# Patient Record
Sex: Female | Born: 1935 | Hispanic: No | Smoking: Never smoker
Health system: Southern US, Community
[De-identification: ages and names within clinical notes are randomized; demographics above are authoritative.]

## PROBLEM LIST (undated history)

## (undated) DIAGNOSIS — I2699 Other pulmonary embolism without acute cor pulmonale: Secondary | ICD-10-CM

## (undated) DIAGNOSIS — M1990 Unspecified osteoarthritis, unspecified site: Secondary | ICD-10-CM

## (undated) DIAGNOSIS — G2581 Restless legs syndrome: Secondary | ICD-10-CM

## (undated) DIAGNOSIS — I10 Essential (primary) hypertension: Secondary | ICD-10-CM

## (undated) HISTORY — PX: KNEE SURGERY: SHX244

## (undated) HISTORY — PX: EYE SURGERY: SHX253

## (undated) HISTORY — PX: CHOLECYSTECTOMY: SHX55

## (undated) HISTORY — PX: WRIST SURGERY: SHX841

## (undated) HISTORY — PX: ABDOMINAL HYSTERECTOMY: SHX81

---

## 2001-08-22 ENCOUNTER — Encounter: Admission: RE | Admit: 2001-08-22 | Discharge: 2001-08-22 | Payer: Self-pay | Admitting: Neurosurgery

## 2003-12-30 ENCOUNTER — Ambulatory Visit (HOSPITAL_COMMUNITY): Admission: RE | Admit: 2003-12-30 | Discharge: 2003-12-30 | Payer: Self-pay | Admitting: Orthopaedic Surgery

## 2005-05-18 ENCOUNTER — Ambulatory Visit: Payer: Self-pay | Admitting: Cardiology

## 2011-01-22 ENCOUNTER — Emergency Department (HOSPITAL_COMMUNITY): Payer: No Typology Code available for payment source

## 2011-01-22 ENCOUNTER — Emergency Department (HOSPITAL_COMMUNITY)
Admission: EM | Admit: 2011-01-22 | Discharge: 2011-01-22 | Disposition: A | Discharge status: Home / Self Care | Payer: No Typology Code available for payment source | Attending: Emergency Medicine | Admitting: Emergency Medicine

## 2011-01-22 ENCOUNTER — Encounter: Payer: Self-pay | Admitting: Emergency Medicine

## 2011-01-22 DIAGNOSIS — R51 Headache: Secondary | ICD-10-CM | POA: Insufficient documentation

## 2011-01-22 DIAGNOSIS — M129 Arthropathy, unspecified: Secondary | ICD-10-CM | POA: Insufficient documentation

## 2011-01-22 DIAGNOSIS — S0990XA Unspecified injury of head, initial encounter: Secondary | ICD-10-CM

## 2011-01-22 DIAGNOSIS — I10 Essential (primary) hypertension: Secondary | ICD-10-CM | POA: Insufficient documentation

## 2011-01-22 HISTORY — DX: Unspecified osteoarthritis, unspecified site: M19.90

## 2011-01-22 HISTORY — DX: Essential (primary) hypertension: I10

## 2011-01-22 MED ORDER — CYCLOBENZAPRINE HCL 10 MG PO TABS: 10.0000 mg | ORAL_TABLET | Freq: Two times a day (BID) | ORAL | Status: AC | PRN
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
## 2013-06-04 ENCOUNTER — Ambulatory Visit (HOSPITAL_COMMUNITY)
Admission: RE | Admit: 2013-06-04 | Discharge: 2013-06-04 | Disposition: A | Discharge status: Home / Self Care | Payer: Medicare Other | Source: Ambulatory Visit | Attending: Orthopaedic Surgery | Admitting: Orthopaedic Surgery

## 2013-06-04 ENCOUNTER — Other Ambulatory Visit (HOSPITAL_COMMUNITY): Payer: Self-pay | Admitting: Orthopaedic Surgery

## 2013-06-04 DIAGNOSIS — M79604 Pain in right leg: Secondary | ICD-10-CM

## 2013-06-04 DIAGNOSIS — M79609 Pain in unspecified limb: Secondary | ICD-10-CM | POA: Insufficient documentation

## 2015-07-04 DIAGNOSIS — M9902 Segmental and somatic dysfunction of thoracic region: Secondary | ICD-10-CM | POA: Diagnosis not present

## 2015-07-04 DIAGNOSIS — M47812 Spondylosis without myelopathy or radiculopathy, cervical region: Secondary | ICD-10-CM | POA: Diagnosis not present

## 2015-07-04 DIAGNOSIS — M9901 Segmental and somatic dysfunction of cervical region: Secondary | ICD-10-CM | POA: Diagnosis not present

## 2015-07-04 DIAGNOSIS — M546 Pain in thoracic spine: Secondary | ICD-10-CM | POA: Diagnosis not present

## 2015-07-04 DIAGNOSIS — M9903 Segmental and somatic dysfunction of lumbar region: Secondary | ICD-10-CM | POA: Diagnosis not present

## 2015-07-04 DIAGNOSIS — M47816 Spondylosis without myelopathy or radiculopathy, lumbar region: Secondary | ICD-10-CM | POA: Diagnosis not present

## 2015-07-04 DIAGNOSIS — M545 Low back pain: Secondary | ICD-10-CM | POA: Diagnosis not present

## 2015-07-07 DIAGNOSIS — M47816 Spondylosis without myelopathy or radiculopathy, lumbar region: Secondary | ICD-10-CM | POA: Diagnosis not present

## 2015-07-07 DIAGNOSIS — M546 Pain in thoracic spine: Secondary | ICD-10-CM | POA: Diagnosis not present

## 2015-07-07 DIAGNOSIS — M9902 Segmental and somatic dysfunction of thoracic region: Secondary | ICD-10-CM | POA: Diagnosis not present

## 2015-07-07 DIAGNOSIS — M9901 Segmental and somatic dysfunction of cervical region: Secondary | ICD-10-CM | POA: Diagnosis not present

## 2015-07-07 DIAGNOSIS — M545 Low back pain: Secondary | ICD-10-CM | POA: Diagnosis not present

## 2015-07-07 DIAGNOSIS — M47812 Spondylosis without myelopathy or radiculopathy, cervical region: Secondary | ICD-10-CM | POA: Diagnosis not present

## 2015-07-07 DIAGNOSIS — M9903 Segmental and somatic dysfunction of lumbar region: Secondary | ICD-10-CM | POA: Diagnosis not present

## 2015-07-12 DIAGNOSIS — K219 Gastro-esophageal reflux disease without esophagitis: Secondary | ICD-10-CM | POA: Diagnosis not present

## 2015-07-12 DIAGNOSIS — N183 Chronic kidney disease, stage 3 (moderate): Secondary | ICD-10-CM | POA: Diagnosis not present

## 2015-07-12 DIAGNOSIS — Z789 Other specified health status: Secondary | ICD-10-CM | POA: Diagnosis not present

## 2015-07-12 DIAGNOSIS — Z418 Encounter for other procedures for purposes other than remedying health state: Secondary | ICD-10-CM | POA: Diagnosis not present

## 2015-07-12 DIAGNOSIS — J329 Chronic sinusitis, unspecified: Secondary | ICD-10-CM | POA: Diagnosis not present

## 2015-07-12 DIAGNOSIS — I10 Essential (primary) hypertension: Secondary | ICD-10-CM | POA: Diagnosis not present

## 2015-07-14 DIAGNOSIS — M9903 Segmental and somatic dysfunction of lumbar region: Secondary | ICD-10-CM | POA: Diagnosis not present

## 2015-07-14 DIAGNOSIS — M9901 Segmental and somatic dysfunction of cervical region: Secondary | ICD-10-CM | POA: Diagnosis not present

## 2015-07-14 DIAGNOSIS — M9902 Segmental and somatic dysfunction of thoracic region: Secondary | ICD-10-CM | POA: Diagnosis not present

## 2015-07-14 DIAGNOSIS — M545 Low back pain: Secondary | ICD-10-CM | POA: Diagnosis not present

## 2015-07-14 DIAGNOSIS — M47816 Spondylosis without myelopathy or radiculopathy, lumbar region: Secondary | ICD-10-CM | POA: Diagnosis not present

## 2015-07-14 DIAGNOSIS — M47812 Spondylosis without myelopathy or radiculopathy, cervical region: Secondary | ICD-10-CM | POA: Diagnosis not present

## 2015-07-14 DIAGNOSIS — M546 Pain in thoracic spine: Secondary | ICD-10-CM | POA: Diagnosis not present

## 2015-07-19 DIAGNOSIS — I10 Essential (primary) hypertension: Secondary | ICD-10-CM | POA: Diagnosis not present

## 2015-07-19 DIAGNOSIS — E079 Disorder of thyroid, unspecified: Secondary | ICD-10-CM | POA: Diagnosis not present

## 2015-07-19 DIAGNOSIS — Z6833 Body mass index (BMI) 33.0-33.9, adult: Secondary | ICD-10-CM | POA: Diagnosis not present

## 2015-07-19 DIAGNOSIS — M25571 Pain in right ankle and joints of right foot: Secondary | ICD-10-CM | POA: Diagnosis not present

## 2015-07-19 DIAGNOSIS — M25552 Pain in left hip: Secondary | ICD-10-CM | POA: Diagnosis not present

## 2015-07-19 DIAGNOSIS — M25572 Pain in left ankle and joints of left foot: Secondary | ICD-10-CM | POA: Diagnosis not present

## 2015-07-28 DIAGNOSIS — M545 Low back pain: Secondary | ICD-10-CM | POA: Diagnosis not present

## 2015-07-28 DIAGNOSIS — M9901 Segmental and somatic dysfunction of cervical region: Secondary | ICD-10-CM | POA: Diagnosis not present

## 2015-07-28 DIAGNOSIS — M47816 Spondylosis without myelopathy or radiculopathy, lumbar region: Secondary | ICD-10-CM | POA: Diagnosis not present

## 2015-07-28 DIAGNOSIS — M47812 Spondylosis without myelopathy or radiculopathy, cervical region: Secondary | ICD-10-CM | POA: Diagnosis not present

## 2015-07-28 DIAGNOSIS — M546 Pain in thoracic spine: Secondary | ICD-10-CM | POA: Diagnosis not present

## 2015-07-28 DIAGNOSIS — M9903 Segmental and somatic dysfunction of lumbar region: Secondary | ICD-10-CM | POA: Diagnosis not present

## 2015-07-28 DIAGNOSIS — M9902 Segmental and somatic dysfunction of thoracic region: Secondary | ICD-10-CM | POA: Diagnosis not present

## 2015-08-10 DIAGNOSIS — L259 Unspecified contact dermatitis, unspecified cause: Secondary | ICD-10-CM | POA: Diagnosis not present

## 2015-08-10 DIAGNOSIS — Z85828 Personal history of other malignant neoplasm of skin: Secondary | ICD-10-CM | POA: Diagnosis not present

## 2015-09-12 DIAGNOSIS — Z7982 Long term (current) use of aspirin: Secondary | ICD-10-CM | POA: Diagnosis not present

## 2015-09-12 DIAGNOSIS — R1084 Generalized abdominal pain: Secondary | ICD-10-CM | POA: Diagnosis not present

## 2015-09-12 DIAGNOSIS — I10 Essential (primary) hypertension: Secondary | ICD-10-CM | POA: Diagnosis not present

## 2015-09-12 DIAGNOSIS — L259 Unspecified contact dermatitis, unspecified cause: Secondary | ICD-10-CM | POA: Diagnosis not present

## 2015-09-12 DIAGNOSIS — K219 Gastro-esophageal reflux disease without esophagitis: Secondary | ICD-10-CM | POA: Diagnosis not present

## 2015-09-12 DIAGNOSIS — Z8249 Family history of ischemic heart disease and other diseases of the circulatory system: Secondary | ICD-10-CM | POA: Diagnosis not present

## 2015-09-12 DIAGNOSIS — J45909 Unspecified asthma, uncomplicated: Secondary | ICD-10-CM | POA: Diagnosis not present

## 2015-09-12 DIAGNOSIS — Z79899 Other long term (current) drug therapy: Secondary | ICD-10-CM | POA: Diagnosis not present

## 2015-09-12 DIAGNOSIS — R109 Unspecified abdominal pain: Secondary | ICD-10-CM | POA: Diagnosis not present

## 2015-09-13 DIAGNOSIS — L0291 Cutaneous abscess, unspecified: Secondary | ICD-10-CM | POA: Diagnosis not present

## 2015-09-13 DIAGNOSIS — I10 Essential (primary) hypertension: Secondary | ICD-10-CM | POA: Diagnosis not present

## 2015-09-19 DIAGNOSIS — R109 Unspecified abdominal pain: Secondary | ICD-10-CM | POA: Diagnosis not present

## 2015-09-19 DIAGNOSIS — Z299 Encounter for prophylactic measures, unspecified: Secondary | ICD-10-CM | POA: Diagnosis not present

## 2015-09-19 DIAGNOSIS — L02216 Cutaneous abscess of umbilicus: Secondary | ICD-10-CM | POA: Diagnosis not present

## 2015-09-19 DIAGNOSIS — Z789 Other specified health status: Secondary | ICD-10-CM | POA: Diagnosis not present

## 2015-09-19 DIAGNOSIS — L03316 Cellulitis of umbilicus: Secondary | ICD-10-CM | POA: Diagnosis not present

## 2015-09-21 ENCOUNTER — Emergency Department (HOSPITAL_COMMUNITY)
Admission: EM | Admit: 2015-09-21 | Discharge: 2015-09-22 | Disposition: A | Discharge status: Home / Self Care | Payer: Medicare Other | Attending: Emergency Medicine | Admitting: Emergency Medicine

## 2015-09-21 ENCOUNTER — Encounter (HOSPITAL_COMMUNITY): Payer: Self-pay | Admitting: Emergency Medicine

## 2015-09-21 DIAGNOSIS — M1990 Unspecified osteoarthritis, unspecified site: Secondary | ICD-10-CM | POA: Diagnosis not present

## 2015-09-21 DIAGNOSIS — L02211 Cutaneous abscess of abdominal wall: Secondary | ICD-10-CM | POA: Diagnosis not present

## 2015-09-21 DIAGNOSIS — B9689 Other specified bacterial agents as the cause of diseases classified elsewhere: Secondary | ICD-10-CM | POA: Diagnosis not present

## 2015-09-21 DIAGNOSIS — I10 Essential (primary) hypertension: Secondary | ICD-10-CM | POA: Diagnosis not present

## 2015-09-21 LAB — COMPREHENSIVE METABOLIC PANEL
ALT: 29 U/L (ref 14–54)
AST: 35 U/L (ref 15–41)
Albumin: 4.5 g/dL (ref 3.5–5.0)
Alkaline Phosphatase: 58 U/L (ref 38–126)
Anion gap: 7 (ref 5–15)
BILIRUBIN TOTAL: 0.5 mg/dL (ref 0.3–1.2)
BUN: 15 mg/dL (ref 6–20)
CO2: 26 mmol/L (ref 22–32)
CREATININE: 1.09 mg/dL — AB (ref 0.44–1.00)
Calcium: 9.4 mg/dL (ref 8.9–10.3)
Chloride: 104 mmol/L (ref 101–111)
GFR, EST AFRICAN AMERICAN: 55 mL/min — AB (ref >60)
GFR, EST NON AFRICAN AMERICAN: 47 mL/min — AB (ref >60)
Glucose, Bld: 98 mg/dL (ref 65–99)
POTASSIUM: 4 mmol/L (ref 3.5–5.1)
Sodium: 137 mmol/L (ref 135–145)
Total Protein: 8.7 g/dL — ABNORMAL HIGH (ref 6.5–8.1)

## 2015-09-21 LAB — CBC WITH DIFFERENTIAL/PLATELET
BASOS ABS: 0 10*3/uL (ref 0.0–0.1)
Basophils Relative: 1 %
EOS PCT: 1 %
Eosinophils Absolute: 0.1 10*3/uL (ref 0.0–0.7)
HEMATOCRIT: 40.9 % (ref 36.0–46.0)
Hemoglobin: 13.8 g/dL (ref 12.0–15.0)
LYMPHS ABS: 3.3 10*3/uL (ref 0.7–4.0)
LYMPHS PCT: 57 %
MCH: 29.1 pg (ref 26.0–34.0)
MCHC: 33.7 g/dL (ref 30.0–36.0)
MCV: 86.3 fL (ref 78.0–100.0)
MONO ABS: 0.5 10*3/uL (ref 0.1–1.0)
MONOS PCT: 9 %
NEUTROS ABS: 1.9 10*3/uL (ref 1.7–7.7)
Neutrophils Relative %: 32 %
Platelets: 245 10*3/uL (ref 150–400)
RBC: 4.74 MIL/uL (ref 3.87–5.11)
RDW: 15.2 % (ref 11.5–15.5)
WBC: 5.8 10*3/uL (ref 4.0–10.5)

## 2015-09-21 NOTE — ED Notes (Signed)
Dressing applied to umbilicus

## 2015-09-21 NOTE — ED Notes (Signed)
Pt states she has an abscess behind her bellybutton that was evaluated by MRI at [facility].  Is scheduled to see the surgeon tomorrow for evaluation, but tonight it began draining and bleeding and she became concerned.

## 2015-09-21 NOTE — Discharge Instructions (Signed)
You can shower. Simple dressing over wound. See the surgeon tomorrow afternoon.

## 2015-09-21 NOTE — ED Provider Notes (Signed)
CSN: [id number]     Arrival date & time 09/21/15  1827 History  By signing my name below, I, Angela House, attest that this documentation has been prepared under the direction and in the presence of Nat Christen, MD . Electronically Signed: Rowan House, Scribe. 09/21/2015. 9:52 PM.   Chief Complaint  Patient presents with  . Wound Check   The history is provided by the patient. No language interpreter was used.   HPI Comments:  Angela House is a 80 y.o. female with PMHx of HTN who presents to the Emergency Department complaining of an abdominal abscess which began draining and bleeding tonight. Pt reports associated redness for the past week. She states the wound has been aggravated since March 11. She is currently taking Septra DS. This wound was evaluated by MRI at [facility] two days ago; she is scheduled for evaluation by surgeon Dr. Cornelia Copa in [place] tomorrow. No fever sweats or chills.  PCP - Dr. Manuella Ghazi  Past Medical History  Diagnosis Date  . Arthritis   . Hypertension    Past Surgical History  Procedure Laterality Date  . Abdominal hysterectomy    . Cholecystectomy    . Wrist surgery    . Knee surgery    . Eye surgery     History reviewed. No pertinent family history. Social History  Substance Use Topics  . Smoking status: Former Research scientist (life sciences)  . Smokeless tobacco: None  . Alcohol Use: No   OB History    No data available     Review of Systems  Constitutional: Negative for fever.  Skin: Positive for wound.  All other systems reviewed and are negative.   Allergies  Ciprofloxacin and Meclizine  Home Medications   Prior to Admission medications   Medication Sig Start Date End Date Taking? Authorizing Provider  aspirin 81 MG tablet Take 81 mg by mouth daily.      Historical Provider, MD  diclofenac (VOLTAREN) 75 MG EC tablet Take 75 mg by mouth 2 (two) times daily.      Historical Provider, MD  fluticasone (FLONASE) 50 MCG/ACT nasal spray Place 2 sprays into the  nose daily.      Historical Provider, MD  HYDROcodone-acetaminophen (NORCO) 5-325 MG per tablet Take 1 tablet by mouth every 6 (six) hours as needed.      Historical Provider, MD  lisinopril (PRINIVIL,ZESTRIL) 5 MG tablet Take 5 mg by mouth daily.      Historical Provider, MD  omeprazole (PRILOSEC) 20 MG capsule Take 20 mg by mouth 2 (two) times daily.      Historical Provider, MD   BP 118/63 mmHg  Pulse 70  Temp(Src) 98.2 F (36.8 C) (Oral)  Resp 20  Ht 5\' 5"  (1.651 m)  Wt 195 lb (88.451 kg)  BMI 32.45 kg/m2  SpO2 99% Physical Exam  Constitutional: She is oriented to person, place, and time. She appears well-developed and well-nourished.  HENT:  Head: Normocephalic and atraumatic.  Eyes: Conjunctivae and EOM are normal. Pupils are equal, round, and reactive to light.  Neck: Normal range of motion. Neck supple.  Cardiovascular: Normal rate and regular rhythm.   Pulmonary/Chest: Effort normal and breath sounds normal.  Abdominal: Soft. Bowel sounds are normal.  Musculoskeletal: Normal range of motion.  Neurological: She is alert and oriented to person, place, and time.  Skin: Skin is warm and dry.  Area of erythema 5x10cm inferior to umbilicus; 1cm bullae on top of umbilicus draining minimal amount of purulent drainage  Psychiatric: She has a normal mood and affect. Her behavior is normal.  Nursing note and vitals reviewed.   ED Course  Procedures  DIAGNOSTIC STUDIES:  Oxygen Saturation is 99% on RA, normal by my interpretation.    COORDINATION OF CARE:  9:47 PM Discussed wound care with pt. Will apply wound dressing and discharge. Discussed treatment plan with pt at bedside and pt agreed to plan.  Labs Review Labs Reviewed  COMPREHENSIVE METABOLIC PANEL - Abnormal; Notable for the following:    Creatinine, Ser 1.09 (*)    Total Protein 8.7 (*)    GFR calc non Af Amer 47 (*)    GFR calc Af Amer 55 (*)    All other components within normal limits  CBC WITH  DIFFERENTIAL/PLATELET    Imaging Review No results found. I have personally reviewed and evaluated these images and lab results as part of my medical decision-making.   EKG Interpretation None      MDM   Final diagnoses:  Abscess of abdominal wall    Patient has a known abscess of her abdominal wall. It is now draining a small amount of purulent drainage. She'll continue her antibiotic and see the general surgeon tomorrow.  I personally performed the services described in this documentation, which was scribed in my presence. The recorded information has been reviewed and is accurate.     Nat Christen, MD 09/21/15 2221

## 2015-09-22 DIAGNOSIS — L02216 Cutaneous abscess of umbilicus: Secondary | ICD-10-CM | POA: Diagnosis not present

## 2015-09-23 DIAGNOSIS — L02216 Cutaneous abscess of umbilicus: Secondary | ICD-10-CM | POA: Diagnosis not present

## 2015-09-23 DIAGNOSIS — I10 Essential (primary) hypertension: Secondary | ICD-10-CM | POA: Diagnosis not present

## 2015-09-23 DIAGNOSIS — M1990 Unspecified osteoarthritis, unspecified site: Secondary | ICD-10-CM | POA: Diagnosis not present

## 2015-09-23 DIAGNOSIS — Z7982 Long term (current) use of aspirin: Secondary | ICD-10-CM | POA: Diagnosis not present

## 2015-09-23 DIAGNOSIS — Z9049 Acquired absence of other specified parts of digestive tract: Secondary | ICD-10-CM | POA: Diagnosis not present

## 2015-09-23 DIAGNOSIS — Z888 Allergy status to other drugs, medicaments and biological substances status: Secondary | ICD-10-CM | POA: Diagnosis not present

## 2015-09-23 DIAGNOSIS — M069 Rheumatoid arthritis, unspecified: Secondary | ICD-10-CM | POA: Diagnosis not present

## 2015-09-23 DIAGNOSIS — K219 Gastro-esophageal reflux disease without esophagitis: Secondary | ICD-10-CM | POA: Diagnosis not present

## 2015-09-23 DIAGNOSIS — Z79899 Other long term (current) drug therapy: Secondary | ICD-10-CM | POA: Diagnosis not present

## 2015-09-23 DIAGNOSIS — Z883 Allergy status to other anti-infective agents status: Secondary | ICD-10-CM | POA: Diagnosis not present

## 2015-09-23 DIAGNOSIS — E039 Hypothyroidism, unspecified: Secondary | ICD-10-CM | POA: Diagnosis not present

## 2015-09-23 DIAGNOSIS — J45909 Unspecified asthma, uncomplicated: Secondary | ICD-10-CM | POA: Diagnosis not present

## 2015-09-24 DIAGNOSIS — L02216 Cutaneous abscess of umbilicus: Secondary | ICD-10-CM | POA: Diagnosis not present

## 2015-09-24 DIAGNOSIS — I10 Essential (primary) hypertension: Secondary | ICD-10-CM | POA: Diagnosis not present

## 2015-09-24 DIAGNOSIS — Z4800 Encounter for change or removal of nonsurgical wound dressing: Secondary | ICD-10-CM | POA: Diagnosis not present

## 2015-09-26 DIAGNOSIS — I10 Essential (primary) hypertension: Secondary | ICD-10-CM | POA: Diagnosis not present

## 2015-09-26 DIAGNOSIS — Z4800 Encounter for change or removal of nonsurgical wound dressing: Secondary | ICD-10-CM | POA: Diagnosis not present

## 2015-09-26 DIAGNOSIS — L02216 Cutaneous abscess of umbilicus: Secondary | ICD-10-CM | POA: Diagnosis not present

## 2015-09-29 DIAGNOSIS — I10 Essential (primary) hypertension: Secondary | ICD-10-CM | POA: Diagnosis not present

## 2015-09-29 DIAGNOSIS — Z4800 Encounter for change or removal of nonsurgical wound dressing: Secondary | ICD-10-CM | POA: Diagnosis not present

## 2015-09-29 DIAGNOSIS — L02216 Cutaneous abscess of umbilicus: Secondary | ICD-10-CM | POA: Diagnosis not present

## 2015-10-04 ENCOUNTER — Ambulatory Visit: Payer: Medicare Other | Admitting: Orthopaedic Surgery

## 2015-10-04 DIAGNOSIS — Z4800 Encounter for change or removal of nonsurgical wound dressing: Secondary | ICD-10-CM | POA: Diagnosis not present

## 2015-10-04 DIAGNOSIS — I10 Essential (primary) hypertension: Secondary | ICD-10-CM | POA: Diagnosis not present

## 2015-10-04 DIAGNOSIS — L02216 Cutaneous abscess of umbilicus: Secondary | ICD-10-CM | POA: Diagnosis not present

## 2015-10-05 DIAGNOSIS — I10 Essential (primary) hypertension: Secondary | ICD-10-CM | POA: Diagnosis not present

## 2015-10-05 DIAGNOSIS — R87629 Unspecified abnormal cytological findings in specimens from vagina: Secondary | ICD-10-CM | POA: Diagnosis not present

## 2015-10-05 DIAGNOSIS — L02216 Cutaneous abscess of umbilicus: Secondary | ICD-10-CM | POA: Diagnosis not present

## 2015-10-05 DIAGNOSIS — Z6833 Body mass index (BMI) 33.0-33.9, adult: Secondary | ICD-10-CM | POA: Diagnosis not present

## 2015-10-05 DIAGNOSIS — Z4800 Encounter for change or removal of nonsurgical wound dressing: Secondary | ICD-10-CM | POA: Diagnosis not present

## 2015-10-10 DIAGNOSIS — N898 Other specified noninflammatory disorders of vagina: Secondary | ICD-10-CM | POA: Diagnosis not present

## 2015-10-10 DIAGNOSIS — K219 Gastro-esophageal reflux disease without esophagitis: Secondary | ICD-10-CM | POA: Diagnosis not present

## 2015-10-18 ENCOUNTER — Encounter: Payer: Self-pay | Admitting: Orthopaedic Surgery

## 2015-10-18 ENCOUNTER — Ambulatory Visit (INDEPENDENT_AMBULATORY_CARE_PROVIDER_SITE_OTHER): Payer: Medicare Other | Admitting: Orthopaedic Surgery

## 2015-10-18 VITALS — BP 133/70 | HR 67 | Temp 97.3°F | Ht 65.0 in | Wt 195.0 lb

## 2015-10-18 DIAGNOSIS — M25552 Pain in left hip: Secondary | ICD-10-CM | POA: Diagnosis not present

## 2015-10-18 MED ORDER — TRAMADOL HCL 50 MG PO TABS: 50.0000 mg | ORAL_TABLET | Freq: Four times a day (QID) | ORAL | Status: DC | PRN

## 2015-10-18 NOTE — Progress Notes (Signed)
[name], female DOB:07-11-1935, 80 y.o. [name]  Chief Complaint  Patient presents with  . Follow-up    Left hip pain     HPI  Angela House is a 80 y.o. female who has chronic left hip pain and lower back pain.  She has been stable with this.  She has no new trauma, no paresthesias, no redness.    She has had abdominal surgery on 09-23-15 for an abscess near her umbilicus area.  Dr. Anthony House in [place] has done the surgery.  She had packing of the wound and it is getting almost closed now.  She was very ill for a while she says.  During this time she has not been active and her back has not been hurting.  The antibiotics have really upset her GERD and she has had difficulty eating a lot of foods.  She has lost weight. She does not feel well.  She has no fever or chills now.  She was placed on Toradol and would like a refill for this.  I have done that.  HPI  Body mass index is 32.45 kg/(m^2).   Review of Systems  HENT: Negative for congestion.   Respiratory: Negative for cough and shortness of breath.   Cardiovascular: Negative for chest pain and leg swelling.  Gastrointestinal: Positive for abdominal pain.  Endocrine: Positive for cold intolerance.  Musculoskeletal: Positive for back pain, arthralgias and gait problem.  Allergic/Immunologic: Positive for environmental allergies.    Past Medical History  Diagnosis Date  . Arthritis   . Hypertension     Past Surgical History  Procedure Laterality Date  . Abdominal hysterectomy    . Cholecystectomy    . Wrist surgery    . Knee surgery    . Eye surgery      History reviewed. No pertinent family history.  Social History Social History  Substance Use Topics  . Smoking status: Former Research scientist (life sciences)  . Smokeless tobacco: None  . Alcohol Use: No    Allergies  Allergen Reactions  . Ciprofloxacin Hives  . Meclizine Other (See Comments)    dizziness    Current Outpatient Prescriptions  Medication Sig  Dispense Refill  . aspirin 81 MG tablet Take 81 mg by mouth daily.      . fluticasone (FLONASE) 50 MCG/ACT nasal spray Place 2 sprays into the nose daily.      . hydrochlorothiazide (HYDRODIURIL) 25 MG tablet Take 25 mg by mouth daily.    Marland Kitchen omeprazole (PRILOSEC) 20 MG capsule Take 20 mg by mouth 2 (two) times daily.      . diclofenac (VOLTAREN) 75 MG EC tablet Take 75 mg by mouth 2 (two) times daily. Reported on 10/18/2015    . traMADol (ULTRAM) 50 MG tablet Take 1 tablet (50 mg total) by mouth every 6 (six) hours as needed. 60 tablet 3   No current facility-administered medications for this visit.     Physical Exam  Blood pressure 133/70, pulse 67, temperature 97.3 F (36.3 C), height 5\' 5"  (1.651 m), weight 195 lb (88.451 kg).  Constitutional: overall normal hygiene, normal nutrition, well developed, normal grooming, normal body habitus. Assistive device:none  Musculoskeletal: gait and station Limp none, muscle tone and strength are normal, no tremors or atrophy is present.  .  Neurological: coordination overall normal.  Deep tendon reflex/nerve stretch intact.  Sensation normal.  Cranial nerves II-XII intact.   Skin:   Scar of abdomen but overall no scars, lesions, ulcers or rashes.  No psoriasis.  Psychiatric: Alert and oriented x 3.  Recent memory intact, remote memory unclear.  Normal mood and affect. Well groomed.  Good eye contact.  Cardiovascular: overall no swelling, no varicosities, no edema bilaterally, normal temperatures of the legs and arms, no clubbing, cyanosis and good capillary refill.  Lymphatic: palpation is normal.  Spine/Pelvis examination:  Inspection:  Overall, sacoiliac joint benign and hips nontender; without crepitus or defects.   Thoracic spine inspection: Alignment normal without kyphosis present   Lumbar spine inspection:  Alignment  with normal lumbar lordosis, without scoliosis apparent.   Thoracic spine palpation:  without tenderness of spinal  processes   Lumbar spine palpation: with tenderness of lumbar area; without tightness of lumbar muscles    Range of Motion:   Lumbar flexion, forward flexion is 35  without pain or tenderness    Lumbar extension is normal  without pain or tenderness   Left lateral bend is Normal  without pain or tenderness   Right lateral bend is Normal without pain or tenderness   Straight leg raising is Normal   Strength & tone: Normal   Stability overall normal stability   The patient has been educated about the nature of the problem(s) and counseled on treatment options.  The patient appeared to understand what I have discussed and is in agreement with it.  Encounter Diagnosis  Name Primary?  . Left hip pain Yes    PLAN Call if any problems.  Precautions discussed.  Continue current medications.   Return to clinic 3 months

## 2015-10-19 DIAGNOSIS — Z713 Dietary counseling and surveillance: Secondary | ICD-10-CM | POA: Diagnosis not present

## 2015-10-19 DIAGNOSIS — M2550 Pain in unspecified joint: Secondary | ICD-10-CM | POA: Diagnosis not present

## 2015-10-19 DIAGNOSIS — Z6832 Body mass index (BMI) 32.0-32.9, adult: Secondary | ICD-10-CM | POA: Diagnosis not present

## 2015-10-19 DIAGNOSIS — K219 Gastro-esophageal reflux disease without esophagitis: Secondary | ICD-10-CM | POA: Diagnosis not present

## 2015-10-24 ENCOUNTER — Encounter: Payer: Self-pay | Admitting: Internal Medicine

## 2015-11-10 DIAGNOSIS — R21 Rash and other nonspecific skin eruption: Secondary | ICD-10-CM | POA: Diagnosis not present

## 2015-11-10 DIAGNOSIS — Z299 Encounter for prophylactic measures, unspecified: Secondary | ICD-10-CM | POA: Diagnosis not present

## 2015-11-10 DIAGNOSIS — K219 Gastro-esophageal reflux disease without esophagitis: Secondary | ICD-10-CM | POA: Diagnosis not present

## 2015-11-17 ENCOUNTER — Ambulatory Visit (INDEPENDENT_AMBULATORY_CARE_PROVIDER_SITE_OTHER): Payer: Medicare Other | Admitting: Nurse Practitioner

## 2015-11-17 ENCOUNTER — Encounter: Payer: Self-pay | Admitting: Nurse Practitioner

## 2015-11-17 VITALS — BP 129/77 | HR 67 | Temp 97.8°F | Ht 65.0 in | Wt 191.4 lb

## 2015-11-17 DIAGNOSIS — K219 Gastro-esophageal reflux disease without esophagitis: Secondary | ICD-10-CM

## 2015-11-17 NOTE — Patient Instructions (Addendum)
1. Continue taking Nexium. 2. Avoid foods that make your symptoms worse. 3. Try to avoid eating within 3 hours of going to bed at night. 4. Return for follow-up in 2 months.   Gastroesophageal Reflux Disease, Adult Normally, food travels down the esophagus and stays in the stomach to be digested. However, when a person has gastroesophageal reflux disease (GERD), food and stomach acid move back up into the esophagus. When this happens, the esophagus becomes sore and inflamed. Over time, GERD can create small holes (ulcers) in the lining of the esophagus.  CAUSES This condition is caused by a problem with the muscle between the esophagus and the stomach (lower esophageal sphincter, or LES). Normally, the LES muscle closes after food passes through the esophagus to the stomach. When the LES is weakened or abnormal, it does not close properly, and that allows food and stomach acid to go back up into the esophagus. The LES can be weakened by certain dietary substances, medicines, and medical conditions, including:  Tobacco use.  Pregnancy.  Having a hiatal hernia.  Heavy alcohol use.  Certain foods and beverages, such as coffee, chocolate, onions, and peppermint. RISK FACTORS This condition is more likely to develop in:  People who have an increased body weight.  People who have connective tissue disorders.  People who use NSAID medicines. SYMPTOMS Symptoms of this condition include:  Heartburn.  Difficult or painful swallowing.  The feeling of having a lump in the throat.  Abitter taste in the mouth.  Bad breath.  Having a large amount of saliva.  Having an upset or bloated stomach.  Belching.  Chest pain.  Shortness of breath or wheezing.  Ongoing (chronic) cough or a night-time cough.  Wearing away of tooth enamel.  Weight loss. Different conditions can cause chest pain. Make sure to see your health care provider if you experience chest pain. DIAGNOSIS Your  health care provider will take a medical history and perform a physical exam. To determine if you have mild or severe GERD, your health care provider may also monitor how you respond to treatment. You may also have other tests, including:  An endoscopy toexamine your stomach and esophagus with a small camera.  A test thatmeasures the acidity level in your esophagus.  A test thatmeasures how much pressure is on your esophagus.  A barium swallow or modified barium swallow to show the shape, size, and functioning of your esophagus. TREATMENT The goal of treatment is to help relieve your symptoms and to prevent complications. Treatment for this condition may vary depending on how severe your symptoms are. Your health care provider may recommend:  Changes to your diet.  Medicine.  Surgery. HOME CARE INSTRUCTIONS Diet  Follow a diet as recommended by your health care provider. This may involve avoiding foods and drinks such as:  Coffee and tea (with or without caffeine).  Drinks that containalcohol.  Energy drinks and sports drinks.  Carbonated drinks or sodas.  Chocolate and cocoa.  Peppermint and mint flavorings.  Garlic and onions.  Horseradish.  Spicy and acidic foods, including peppers, chili powder, curry powder, vinegar, hot sauces, and barbecue sauce.  Citrus fruit juices and citrus fruits, such as oranges, lemons, and limes.  Tomato-based foods, such as red sauce, chili, salsa, and pizza with red sauce.  Fried and fatty foods, such as donuts, french fries, potato chips, and high-fat dressings.  High-fat meats, such as hot dogs and fatty cuts of red and white meats, such as rib eye  steak, sausage, ham, and bacon.  High-fat dairy items, such as whole milk, butter, and cream cheese.  Eat small, frequent meals instead of large meals.  Avoid drinking large amounts of liquid with your meals.  Avoid eating meals during the 2-3 hours before bedtime.  Avoid lying  down right after you eat.  Do not exercise right after you eat. General Instructions  Pay attention to any changes in your symptoms.  Take over-the-counter and prescription medicines only as told by your health care provider. Do not take aspirin, ibuprofen, or other NSAIDs unless your health care provider told you to do so.  Do not use any tobacco products, including cigarettes, chewing tobacco, and e-cigarettes. If you need help quitting, ask your health care provider.  Wear loose-fitting clothing. Do not wear anything tight around your waist that causes pressure on your abdomen.  Raise (elevate) the head of your bed 6 inches (15cm).  Try to reduce your stress, such as with yoga or meditation. If you need help reducing stress, ask your health care provider.  If you are overweight, reduce your weight to an amount that is healthy for you. Ask your health care provider for guidance about a safe weight loss goal.  Keep all follow-up visits as told by your health care provider. This is important. SEEK MEDICAL CARE IF:  You have new symptoms.  You have unexplained weight loss.  You have difficulty swallowing, or it hurts to swallow.  You have wheezing or a persistent cough.  Your symptoms do not improve with treatment.  You have a hoarse voice. SEEK IMMEDIATE MEDICAL CARE IF:  You have pain in your arms, neck, jaw, teeth, or back.  You feel sweaty, dizzy, or light-headed.  You have chest pain or shortness of breath.  You vomit and your vomit looks like blood or coffee grounds.  You faint.  Your stool is bloody or black.  You cannot swallow, drink, or eat.   This information is not intended to replace advice given to you by your health care provider. Make sure you discuss any questions you have with your health care provider.   Document Released: 03/28/2005 Document Revised: 03/09/2015 Document Reviewed: 10/13/2014 Elsevier Interactive Patient Education 2016 [place] for Gastroesophageal Reflux Disease, Adult When you have gastroesophageal reflux disease (GERD), the foods you eat and your eating habits are very important. Choosing the right foods can help ease the discomfort of GERD. WHAT GENERAL GUIDELINES DO I NEED TO FOLLOW?  Choose fruits, vegetables, whole grains, low-fat dairy products, and low-fat meat, fish, and poultry.  Limit fats such as oils, salad dressings, butter, nuts, and avocado.  Keep a food diary to identify foods that cause symptoms.  Avoid foods that cause reflux. These may be different for different people.  Eat frequent small meals instead of three large meals each day.  Eat your meals slowly, in a relaxed setting.  Limit fried foods.  Cook foods using methods other than frying.  Avoid drinking alcohol.  Avoid drinking large amounts of liquids with your meals.  Avoid bending over or lying down until 2-3 hours after eating. WHAT FOODS ARE NOT RECOMMENDED? The following are some foods and drinks that may worsen your symptoms: Vegetables Tomatoes. Tomato juice. Tomato and spaghetti sauce. Chili peppers. Onion and garlic. Horseradish. Fruits Oranges, grapefruit, and lemon (fruit and juice). Meats High-fat meats, fish, and poultry. This includes hot dogs, ribs, ham, sausage, salami, and bacon. Dairy Whole milk and  chocolate milk. Sour cream. Cream. Butter. Ice cream. Cream cheese.  Beverages Coffee and tea, with or without caffeine. Carbonated beverages or energy drinks. Condiments Hot sauce. Barbecue sauce.  Sweets/Desserts Chocolate and cocoa. Donuts. Peppermint and spearmint. Fats and Oils High-fat foods, including [place] fries and potato chips. Other Vinegar. Strong spices, such as black pepper, white pepper, red pepper, cayenne, curry powder, cloves, ginger, and chili powder. The items listed above may not be a complete list of foods and beverages to avoid. Contact your dietitian for  more information.   This information is not intended to replace advice given to you by your health care provider. Make sure you discuss any questions you have with your health care provider.   Document Released: 06/18/2005 Document Revised: 07/09/2014 Document Reviewed: 04/22/2013 Elsevier Interactive Patient Education [employer].

## 2015-11-17 NOTE — Assessment & Plan Note (Addendum)
Long-standing history of acid reflux and has been on Prilosec for number of years. Her symptoms began worsening recently and saw her primary care changed her to Nexium. She is only been on Nexium for one week but is already noting improvement. Symptoms are now relegated to typically evening times, dependent on dietary intake and late night eating. Recommend avoid trigger foods, do not eat within 3-4 hours of going to bed at night, continue Nexium. We'll bring her back in 2 months to assess long-term symptom progression.

## 2015-11-17 NOTE — Progress Notes (Signed)
Primary Care Physician:  Monico Blitz, MD Primary Gastroenterologist:  Dr. Gala Romney  Chief Complaint  Patient presents with  . Gastroesophageal Reflux    HPI:   Angela House is a 80 y.o. female who presents on referral from primary care for GERD symptoms. Last seen by PCP on 10/19/2015 at which point it describes substernal heartburn symptoms which have been decreasing and are moderate in severity. Pain located upper abdomen and associated with bloating and diarrhea. At that time to change her to Nexium from Prilosec. No history of colonoscopy or endoscopy in our system.  Today she states she had been on Prilosec for "years." Switched to Nexium with some improvement. Will have breakthrough symptoms depending on foods she eats. Previously on Prilosec had symptoms daily and not dependent on any particular situation. On Nexium when she has breakthrough typically later at night. Has been eating 8:30 or 9:00 pm and goes to bed around 10:30 or 11:00 pm. Denies other abdominal pain, N/V, fever, chills, hematochezia, melena, fever, chills, unintentional weight loss. Denies chest pain, dyspnea, dizziness, lightheadedness, syncope, near syncope. Denies any other upper or lower GI symptoms.  Has only been on Nexium for about a week.  Past Medical History  Diagnosis Date  . Arthritis   . Hypertension     Past Surgical History  Procedure Laterality Date  . Abdominal hysterectomy    . Cholecystectomy    . Wrist surgery    . Knee surgery    . Eye surgery      Current Outpatient Prescriptions  Medication Sig Dispense Refill  . acyclovir (ZOVIRAX) 800 MG tablet Take 800 mg by mouth 5 (five) times daily.    Marland Kitchen aspirin 81 MG tablet Take 81 mg by mouth daily.      . calcium carbonate (OS-CAL - DOSED IN MG OF ELEMENTAL CALCIUM) 1250 (500 Ca) MG tablet Take 1 tablet by mouth.    . cetirizine (ZYRTEC) 10 MG tablet Take 10 mg by mouth daily.    . diclofenac (VOLTAREN) 75 MG EC tablet Take 75 mg by  mouth 2 (two) times daily. Reported on 10/18/2015    . esomeprazole (NEXIUM) 40 MG capsule Take 40 mg by mouth daily at 12 noon.    . fluticasone (FLONASE) 50 MCG/ACT nasal spray Place 2 sprays into the nose daily.      . hydrochlorothiazide (HYDRODIURIL) 25 MG tablet Take 25 mg by mouth daily.    . Multiple Vitamin (MULTIVITAMIN) capsule Take 1 capsule by mouth daily.    . Omega-3 Fatty Acids (OMEGA-3 FISH OIL PO) Take 1,000 mg by mouth daily.    . potassium bicarbonate (K-LYTE) 25 MEQ disintegrating tablet Take 25 mEq by mouth as needed.    . PredniSONE (RAYOS) 2 MG TBEC Take 2 mg by mouth daily.    . Probiotic Product (PROBIOTIC PO) Take by mouth daily.    . traMADol (ULTRAM) 50 MG tablet Take 1 tablet (50 mg total) by mouth every 6 (six) hours as needed. 60 tablet 3  . triamcinolone lotion (KENALOG) 0.1 % Apply 1 application topically 2 (two) times daily.    Marland Kitchen omeprazole (PRILOSEC) 20 MG capsule Take 20 mg by mouth 2 (two) times daily. Reported on 11/17/2015     No current facility-administered medications for this visit.    Allergies as of 11/17/2015 - Review Complete 11/17/2015  Allergen Reaction Noted  . Ciprofloxacin Hives 09/21/2015  . Meclizine Other (See Comments) 09/21/2015    No family history on  file.  Social History   Social History  . Marital Status: Widowed    Spouse Name: N/A  . Number of Children: N/A  . Years of Education: N/A   Occupational History  . Not on file.   Social History Main Topics  . Smoking status: Former Research scientist (life sciences)  . Smokeless tobacco: Not on file  . Alcohol Use: No  . Drug Use: No  . Sexual Activity: Not on file   Other Topics Concern  . Not on file   Social History Narrative    Review of Systems: General: Negative for anorexia, weight loss, fever, chills, fatigue, weakness. ENT: Negative for hoarseness, difficulty swallowing. CV: Negative for chest pain, angina, palpitations, peripheral edema.  Respiratory: Negative for dyspnea at  rest, cough, sputum, wheezing.  GI: See history of present illness. Neuro: Negative for memory loss, confusion.   Endo: Negative for unusual weight change.  Heme: Negative for bruising or bleeding.    Physical Exam: BP 129/77 mmHg  Pulse 67  Temp(Src) 97.8 F (36.6 C) (Oral)  Ht 5\' 5"  (1.651 m)  Wt 191 lb 6.4 oz (86.818 kg)  BMI 31.85 kg/m2 General:   Alert and oriented. Pleasant and cooperative. Well-nourished and well-developed.  Head:  Normocephalic and atraumatic. Eyes:  Without icterus, sclera clear and conjunctiva pink.  Ears:  Normal auditory acuity. Cardiovascular:  S1, S2 present without murmurs appreciated. Extremities without clubbing or edema. Respiratory:  Clear to auscultation bilaterally. No wheezes, rales, or rhonchi. No distress.  Gastrointestinal:  +BS, soft, and non-distended. Mild to moderate abdominal TTP in area of recent abscess draining. No HSM noted. No guarding or rebound. No masses appreciated.  Rectal:  Deferred  Musculoskalatal:  Symmetrical without gross deformities. Neurologic:  Alert and oriented x4;  grossly normal neurologically. Psych:  Alert and cooperative. Normal mood and affect. Heme/Lymph/Immune: No excessive bruising noted.    11/17/2015 10:45 AM   Disclaimer: This note was dictated with voice recognition software. Similar sounding words can inadvertently be transcribed and may not be corrected upon review.

## 2015-11-21 NOTE — Progress Notes (Signed)
CC'D TO PCP °

## 2015-11-25 DIAGNOSIS — R21 Rash and other nonspecific skin eruption: Secondary | ICD-10-CM | POA: Diagnosis not present

## 2015-12-13 ENCOUNTER — Ambulatory Visit (INDEPENDENT_AMBULATORY_CARE_PROVIDER_SITE_OTHER): Payer: Medicare Other | Admitting: Orthopaedic Surgery

## 2015-12-13 ENCOUNTER — Telehealth: Payer: Self-pay | Admitting: Orthopaedic Surgery

## 2015-12-13 ENCOUNTER — Encounter: Payer: Self-pay | Admitting: Orthopaedic Surgery

## 2015-12-13 VITALS — BP 121/71 | HR 63 | Temp 97.3°F | Ht 66.0 in | Wt 186.2 lb

## 2015-12-13 DIAGNOSIS — R21 Rash and other nonspecific skin eruption: Secondary | ICD-10-CM | POA: Diagnosis not present

## 2015-12-13 DIAGNOSIS — L438 Other lichen planus: Secondary | ICD-10-CM | POA: Diagnosis not present

## 2015-12-13 DIAGNOSIS — L439 Lichen planus, unspecified: Secondary | ICD-10-CM | POA: Diagnosis not present

## 2015-12-13 DIAGNOSIS — M25552 Pain in left hip: Secondary | ICD-10-CM

## 2015-12-13 NOTE — Progress Notes (Signed)
Patient IS:[id number] E Angela House, female DOB:[birth year], 80 y.o. [id number]  Chief Complaint  Patient presents with  . Follow-up    left leg and hip    HPI  Angela House is a 80 y.o. female who calls in to be seen for a chronic left thigh rash that she has had for over six weeks.  She has seen Dr. Manuella Ghazi in [place] for this on three separate occasions over the last six weeks.  She was given an ointment to use which did not help, given other medicine that did not help.  She is concerned it is still present. At one time it went from the ankle up the back of the lower leg on the left to the left upper thigh area to the hip.  It is getting better.  It was very red at first.  It does not weep or drain.  At first it was tender and red now it is just a pale red tint color and not tender or hurting  She is concerned about what it is and what to do about it.  She has no trauma, no animal bite or insect bite.  I do not know what it is.  I will have the dermatologist in the lower office see her as soon as she leaves here.  The patient is agreeable.  HPI  Body mass index is 30.07 kg/(m^2).  ROS  Review of Systems  HENT: Negative for congestion.   Respiratory: Negative for cough and shortness of breath.   Cardiovascular: Negative for chest pain and leg swelling.  Gastrointestinal: Positive for abdominal pain.  Endocrine: Positive for cold intolerance.  Musculoskeletal: Positive for back pain, arthralgias and gait problem.  Allergic/Immunologic: Positive for environmental allergies.    Past Medical History  Diagnosis Date  . Arthritis   . Hypertension     Past Surgical History  Procedure Laterality Date  . Abdominal hysterectomy    . Cholecystectomy    . Wrist surgery    . Knee surgery    . Eye surgery      Family History  Problem Relation Age of Onset  . Colon cancer Neg Hx     Social History Social History  Substance Use Topics  . Smoking status: Never Smoker   . Smokeless tobacco:  Never Used  . Alcohol Use: No    Allergies  Allergen Reactions  . Ciprofloxacin Hives  . Meclizine Other (See Comments)    dizziness    Current Outpatient Prescriptions  Medication Sig Dispense Refill  . acyclovir (ZOVIRAX) 800 MG tablet Take 800 mg by mouth 5 (five) times daily.    Marland Kitchen aspirin 81 MG tablet Take 81 mg by mouth daily.      . calcium carbonate (OS-CAL - DOSED IN MG OF ELEMENTAL CALCIUM) 1250 (500 Ca) MG tablet Take 1 tablet by mouth.    . cetirizine (ZYRTEC) 10 MG tablet Take 10 mg by mouth daily.    . diclofenac (VOLTAREN) 75 MG EC tablet Take 75 mg by mouth 2 (two) times daily. Reported on 10/18/2015    . esomeprazole (NEXIUM) 40 MG capsule Take 40 mg by mouth daily at 12 noon.    . fluticasone (FLONASE) 50 MCG/ACT nasal spray Place 2 sprays into the nose daily.      . hydrochlorothiazide (HYDRODIURIL) 25 MG tablet Take 25 mg by mouth daily.    . Multiple Vitamin (MULTIVITAMIN) capsule Take 1 capsule by mouth daily.    . Omega-3 Fatty Acids (OMEGA-3  FISH OIL PO) Take 1,000 mg by mouth daily.    . potassium bicarbonate (K-LYTE) 25 MEQ disintegrating tablet Take 25 mEq by mouth as needed.    . PredniSONE (RAYOS) 2 MG TBEC Take 2 mg by mouth daily.    . Probiotic Product (PROBIOTIC PO) Take by mouth daily.    . traMADol (ULTRAM) 50 MG tablet Take 1 tablet (50 mg total) by mouth every 6 (six) hours as needed. 60 tablet 3  . triamcinolone lotion (KENALOG) 0.1 % Apply 1 application topically 2 (two) times daily.     No current facility-administered medications for this visit.     Physical Exam  Blood pressure 121/71, pulse 63, temperature 97.3 F (36.3 C), height 5\' 6"  (1.676 m), weight 186 lb 3.2 oz (84.46 kg).  Constitutional: overall normal hygiene, normal nutrition, well developed, normal grooming, normal body habitus. Assistive device:none  Musculoskeletal: gait and station Limp none, muscle tone and strength are normal, no tremors or atrophy is present.  .   Neurological: coordination overall normal.  Deep tendon reflex/nerve stretch intact.  Sensation normal.  Cranial nerves II-XII intact.   Skin:   She has a very long posterior thigh red tint streak from just below the knee to the upper thigh area that is about a one cm wide at the most to just a very small sliver at other points.  It is not raised, it is not tender and it is not draining. overall no scars, lesions, ulcers or rashes. No psoriasis.  Psychiatric: Alert and oriented x 3.  Recent memory intact, remote memory unclear.  Normal mood and affect. Well groomed.  Good eye contact.  Cardiovascular: overall no swelling, no varicosities, no edema bilaterally, normal temperatures of the legs and arms, no clubbing, cyanosis and good capillary refill.  Lymphatic: palpation is normal.    The patient has been educated about the nature of the problem(s) and counseled on treatment options.  The patient appeared to understand what I have discussed and is in agreement with it.  Encounter Diagnoses  Name Primary?  . Rash and nonspecific skin eruption Yes  . Left hip pain     PLAN Call if any problems.  Precautions discussed.  Continue current medications.   Return to clinic to see dermatologist now.  I do not know what this rash is.  I will have the dermatologist see her now.  Keep regular appointment for hip.  Electronically Signed Sanjuana Kava, MD 6/13/201711:39 AM

## 2015-12-13 NOTE — Telephone Encounter (Signed)
Patient stopped by office following her visit with dermatologist Dr [facility] office. Report to follow.  Asking if she is to keep her scheduled appointment 01/17/16, or come in another date.  Please advise.

## 2015-12-13 NOTE — Patient Instructions (Addendum)
Referred to Dr. Rozann Lesches (dermatologist).  She will see her today. Patient was notified in the office. Keep regular apt here.

## 2015-12-13 NOTE — Telephone Encounter (Signed)
What ever is best for her.

## 2015-12-15 NOTE — Telephone Encounter (Signed)
Called back to patient; states will keep the 01/17/16 scheduled appointment, however, will call if anything changes that she may need to schedule earlier date.

## 2016-01-09 DIAGNOSIS — R208 Other disturbances of skin sensation: Secondary | ICD-10-CM | POA: Diagnosis not present

## 2016-01-09 DIAGNOSIS — L438 Other lichen planus: Secondary | ICD-10-CM | POA: Diagnosis not present

## 2016-01-16 DIAGNOSIS — Z789 Other specified health status: Secondary | ICD-10-CM | POA: Diagnosis not present

## 2016-01-16 DIAGNOSIS — I10 Essential (primary) hypertension: Secondary | ICD-10-CM | POA: Diagnosis not present

## 2016-01-16 DIAGNOSIS — J00 Acute nasopharyngitis [common cold]: Secondary | ICD-10-CM | POA: Diagnosis not present

## 2016-01-17 ENCOUNTER — Ambulatory Visit (INDEPENDENT_AMBULATORY_CARE_PROVIDER_SITE_OTHER): Payer: Medicare Other

## 2016-01-17 ENCOUNTER — Ambulatory Visit (INDEPENDENT_AMBULATORY_CARE_PROVIDER_SITE_OTHER): Payer: Medicare Other | Admitting: Orthopaedic Surgery

## 2016-01-17 ENCOUNTER — Encounter: Payer: Self-pay | Admitting: Orthopaedic Surgery

## 2016-01-17 VITALS — BP 129/77 | HR 71 | Temp 97.2°F | Ht 66.0 in | Wt 184.8 lb

## 2016-01-17 DIAGNOSIS — M25552 Pain in left hip: Secondary | ICD-10-CM

## 2016-01-17 NOTE — Progress Notes (Signed)
[name], female DOB:[birth year], 80 y.o. [id number]  Chief Complaint  Patient presents with  . Follow-up    Left hip pain    HPI  Angela House is a 80 y.o. female who has hip pain on the left.  She has good and bad days.  She has no new trauma, no paresthesias.  She has no limp, no redness.  She is taking her medicine.  She would like to have an x-ray today.  HPI  Body mass index is 29.84 kg/(m^2).  ROS  Review of Systems  HENT: Negative for congestion.   Respiratory: Negative for cough and shortness of breath.   Cardiovascular: Negative for chest pain and leg swelling.  Gastrointestinal: Positive for abdominal pain.  Endocrine: Positive for cold intolerance.  Musculoskeletal: Positive for back pain, arthralgias and gait problem.  Allergic/Immunologic: Positive for environmental allergies.    Past Medical History  Diagnosis Date  . Arthritis   . Hypertension     Past Surgical History  Procedure Laterality Date  . Abdominal hysterectomy    . Cholecystectomy    . Wrist surgery    . Knee surgery    . Eye surgery      Family History  Problem Relation Age of Onset  . Colon cancer Neg Hx     Social History Social History  Substance Use Topics  . Smoking status: Never Smoker   . Smokeless tobacco: Never Used  . Alcohol Use: No    Allergies  Allergen Reactions  . Ciprofloxacin Hives  . Meclizine Other (See Comments)    dizziness    Current Outpatient Prescriptions  Medication Sig Dispense Refill  . acyclovir (ZOVIRAX) 800 MG tablet Take 800 mg by mouth 5 (five) times daily.    Marland Kitchen aspirin 81 MG tablet Take 81 mg by mouth daily.      . calcium carbonate (OS-CAL - DOSED IN MG OF ELEMENTAL CALCIUM) 1250 (500 Ca) MG tablet Take 1 tablet by mouth.    . cetirizine (ZYRTEC) 10 MG tablet Take 10 mg by mouth daily.    . diclofenac (VOLTAREN) 75 MG EC tablet Take 75 mg by mouth 2 (two) times daily. Reported on 10/18/2015    . esomeprazole (NEXIUM)  40 MG capsule Take 40 mg by mouth daily at 12 noon.    . fluticasone (FLONASE) 50 MCG/ACT nasal spray Place 2 sprays into the nose daily.      . hydrochlorothiazide (HYDRODIURIL) 25 MG tablet Take 25 mg by mouth daily.    . Multiple Vitamin (MULTIVITAMIN) capsule Take 1 capsule by mouth daily.    . Omega-3 Fatty Acids (OMEGA-3 FISH OIL PO) Take 1,000 mg by mouth daily.    . potassium bicarbonate (K-LYTE) 25 MEQ disintegrating tablet Take 25 mEq by mouth as needed.    . PredniSONE (RAYOS) 2 MG TBEC Take 2 mg by mouth daily.    . Probiotic Product (PROBIOTIC PO) Take by mouth daily.    . traMADol (ULTRAM) 50 MG tablet Take 1 tablet (50 mg total) by mouth every 6 (six) hours as needed. 60 tablet 3  . triamcinolone lotion (KENALOG) 0.1 % Apply 1 application topically 2 (two) times daily.     No current facility-administered medications for this visit.     Physical Exam  Blood pressure 129/77, pulse 71, temperature 97.2 F (36.2 C), height 5\' 6"  (1.676 m), weight 184 lb 12.8 oz (83.825 kg).  Constitutional: overall normal hygiene, normal nutrition, well developed, normal grooming, normal  body habitus. Assistive device:none  Musculoskeletal: gait and station Limp none, muscle tone and strength are normal, no tremors or atrophy is present.  .  Neurological: coordination overall normal.  Deep tendon reflex/nerve stretch intact.  Sensation normal.  Cranial nerves II-XII intact.   Skin:   normal overall no scars, lesions, ulcers or rashes. No psoriasis.  Psychiatric: Alert and oriented x 3.  Recent memory intact, remote memory unclear.  Normal mood and affect. Well groomed.  Good eye contact.  Cardiovascular: overall no swelling, no varicosities, no edema bilaterally, normal temperatures of the legs and arms, no clubbing, cyanosis and good capillary refill.  Lymphatic: palpation is normal. She has full motion of the left and right hips.  She has no limp.  NV is intact.  Muscle tone and  strength normal.   The patient has been educated about the nature of the problem(s) and counseled on treatment options.  The patient appeared to understand what I have discussed and is in agreement with it.  Encounter Diagnosis  Name Primary?  . Left hip pain Yes   X-rays were done of the left hip, reported separately.  PLAN Call if any problems.  Precautions discussed.  Continue current medications.   Return to clinic 2 months   Electronically Signed Sanjuana Kava, MD 7/18/201710:27 PM

## 2016-01-18 ENCOUNTER — Ambulatory Visit (INDEPENDENT_AMBULATORY_CARE_PROVIDER_SITE_OTHER): Payer: Medicare Other | Admitting: Nurse Practitioner

## 2016-01-18 ENCOUNTER — Encounter: Payer: Self-pay | Admitting: Nurse Practitioner

## 2016-01-18 VITALS — BP 110/64 | HR 65 | Temp 97.9°F | Ht 65.0 in | Wt 185.2 lb

## 2016-01-18 DIAGNOSIS — K219 Gastro-esophageal reflux disease without esophagitis: Secondary | ICD-10-CM

## 2016-01-18 MED ORDER — ESOMEPRAZOLE MAGNESIUM 40 MG PO CPDR: 40.0000 mg | DELAYED_RELEASE_CAPSULE | Freq: Every day | ORAL | Status: DC

## 2016-01-18 NOTE — Assessment & Plan Note (Signed)
Symptoms resolved at this point on Nexium once daily as well as significant dietary changes. Rare breakthrough symptoms if she eats something she noted she is not supposed to. She is not wanting to be on Nexium for the rest of her life. Recommend she take it for one year and she can try backing off. If she would like our guidance she can call for an appointment in one year. Otherwise, return for follow-up as needed for any new or recurrent symptoms.

## 2016-01-18 NOTE — Patient Instructions (Addendum)
1. I've sent in a refill of your Nexium to your pharmacy. 2. Continue taking Nexium for 1 year. 3. Separate your Nexium and calcium by at least 2-3 hours. 4. Return for follow-up as needed for any new or worsening symptoms or in a year if she would like to come off Nexium.

## 2016-01-18 NOTE — Progress Notes (Signed)
Referring Provider: Monico Blitz, MD Primary Care Physician:  Monico Blitz, MD Primary GI:  Dr. Gala Romney  Chief Complaint  Patient presents with  . Gastroesophageal Reflux    HPI:   Angela House is a 80 y.o. female who presents for follow-up on GERD. Patient was last seen in our office 11/17/2015 for the same. At that time she noted she had been on Prilosec for years and began having worsening symptoms at which point her PCP switched her to Nexium with some improvement after only 1 week. Now breakthrough symptoms dependent on dietary intake, typically at night especially if she eats late in the evening. Recommended she avoid trigger foods, do not eat within 3-4 hours going to bed, continue Nexium. Recommend return for follow-up in 2 months to assess long-term symptom progression.  Today she states she's doing well overall. GERD much better controlled. Has made dietary changes which help. Taking Nexium daily, not wanting to be on it "forever" but wants things to be settled/controlled for a while. Advised her she can stay on it for a year and attempt to wean off (call us if she would like guidance.) Denies abdominal pain, N/V, hematochezia, melena. No acute changes in bowel habits, fever, chills. Denies chest pain, dyspnea, dizziness, lightheadedness, syncope, near syncope. Denies any other upper or lower GI symptoms.  Past Medical History  Diagnosis Date  . Arthritis   . Hypertension     Past Surgical History  Procedure Laterality Date  . Abdominal hysterectomy    . Cholecystectomy    . Wrist surgery    . Knee surgery    . Eye surgery      Current Outpatient Prescriptions  Medication Sig Dispense Refill  . acyclovir (ZOVIRAX) 800 MG tablet Take 800 mg by mouth 5 (five) times daily.    Marland Kitchen aspirin 81 MG tablet Take 81 mg by mouth daily.      . calcium carbonate (OS-CAL - DOSED IN MG OF ELEMENTAL CALCIUM) 1250 (500 Ca) MG tablet Take 1 tablet by mouth.    . cetirizine (ZYRTEC) 10 MG  tablet Take 10 mg by mouth daily.    . diclofenac (VOLTAREN) 75 MG EC tablet Take 75 mg by mouth 2 (two) times daily. Reported on 10/18/2015    . esomeprazole (NEXIUM) 40 MG capsule Take 40 mg by mouth daily at 12 noon.    . fluticasone (FLONASE) 50 MCG/ACT nasal spray Place 2 sprays into the nose daily.      . hydrochlorothiazide (HYDRODIURIL) 25 MG tablet Take 25 mg by mouth daily.    . Multiple Vitamin (MULTIVITAMIN) capsule Take 1 capsule by mouth daily.    . Omega-3 Fatty Acids (OMEGA-3 FISH OIL PO) Take 1,000 mg by mouth daily.    . potassium bicarbonate (K-LYTE) 25 MEQ disintegrating tablet Take 25 mEq by mouth as needed.    . PredniSONE (RAYOS) 2 MG TBEC Take 2 mg by mouth daily.    . Probiotic Product (PROBIOTIC PO) Take by mouth daily.    . traMADol (ULTRAM) 50 MG tablet Take 1 tablet (50 mg total) by mouth every 6 (six) hours as needed. 60 tablet 3  . triamcinolone lotion (KENALOG) 0.1 % Apply 1 application topically 2 (two) times daily.     No current facility-administered medications for this visit.    Allergies as of 01/18/2016 - Review Complete 01/18/2016  Allergen Reaction Noted  . Ciprofloxacin Hives 09/21/2015  . Meclizine Other (See Comments) 09/21/2015    Family History  Problem Relation Age of Onset  . Colon cancer Neg Hx     Social History   Social History  . Marital Status: Widowed    Spouse Name: N/A  . Number of Children: N/A  . Years of Education: N/A   Social History Main Topics  . Smoking status: Never Smoker   . Smokeless tobacco: Never Used  . Alcohol Use: No  . Drug Use: No  . Sexual Activity: Not Asked   Other Topics Concern  . None   Social History Narrative    Review of Systems: General: Negative for anorexia, weight loss, fever, chills, fatigue, weakness. ENT: Negative for hoarseness, difficulty swallowing. CV: Negative for chest pain, angina, palpitations, peripheral edema.  Respiratory: Negative for dyspnea at rest, cough,  sputum, wheezing.  GI: See history of present illness. Endo: Negative for unusual weight change.    Physical Exam: BP 110/64 mmHg  Pulse 65  Temp(Src) 97.9 F (36.6 C) (Oral)  Ht 5\' 5"  (1.651 m)  Wt 185 lb 3.2 oz (84.006 kg)  BMI 30.82 kg/m2 General:   Alert and oriented. Pleasant and cooperative. Well-nourished and well-developed.  Ears:  Normal auditory acuity. Cardiovascular:  S1, S2 present without murmurs appreciated. Extremities without clubbing or edema. Respiratory:  Clear to auscultation bilaterally. No wheezes, rales, or rhonchi. No distress.  Gastrointestinal:  +BS, soft, non-tender and non-distended. No HSM noted. No guarding or rebound. No masses appreciated.  Rectal:  Deferred  Psych:  Alert and cooperative. Normal mood and affect.    01/18/2016 2:52 PM   Disclaimer: This note was dictated with voice recognition software. Similar sounding words can inadvertently be transcribed and may not be corrected upon review.

## 2016-01-18 NOTE — Progress Notes (Signed)
cc'ed to pcp °

## 2016-01-26 ENCOUNTER — Telehealth: Payer: Self-pay

## 2016-01-26 DIAGNOSIS — M25551 Pain in right hip: Secondary | ICD-10-CM | POA: Diagnosis not present

## 2016-01-26 DIAGNOSIS — K219 Gastro-esophageal reflux disease without esophagitis: Secondary | ICD-10-CM | POA: Diagnosis not present

## 2016-01-26 NOTE — Telephone Encounter (Signed)
Pt called- left voicemail- she said she has recently been having some hip pain. She wants to take something for the pain but wants to avoid strong pain pills. She was wondering if EG could advise something that would be safe for her stomach.   Home- [phone] Cell- [phone]

## 2016-01-27 NOTE — Telephone Encounter (Signed)
Called and spoke with patient to discuss situation. Advised she stick to Tylenol products to prevent gastritis/GERD flare. CMP normal, no history of liver disease.

## 2016-01-31 ENCOUNTER — Ambulatory Visit (INDEPENDENT_AMBULATORY_CARE_PROVIDER_SITE_OTHER): Payer: Medicare Other | Admitting: Orthopaedic Surgery

## 2016-01-31 ENCOUNTER — Encounter: Payer: Self-pay | Admitting: Orthopaedic Surgery

## 2016-01-31 VITALS — BP 98/59 | HR 66 | Temp 96.8°F | Ht 65.0 in | Wt 183.8 lb

## 2016-01-31 DIAGNOSIS — M25552 Pain in left hip: Secondary | ICD-10-CM | POA: Diagnosis not present

## 2016-01-31 NOTE — Progress Notes (Signed)
CC:  My left hip is hurting again.  She has recurrent left hip trochanteric bursitis.  She requests an injection.  She has no trauma, no paresthesias, no redness.  She is very tender over the left lateral hip.  Motion is full.  NV is intact. Gait normal.  Encounter Diagnosis  Name Primary?  . Left hip pain Yes   PROCEDURE NOTE:  The patient request injection, verbal consent was obtained.  The left trochanteric area of the hip was prepped appropriately after time out was performed.   Sterile technique was observed and injection of 1 cc of Depo-Medrol 40 mg with several cc's of plain xylocaine. Anesthesia was provided by ethyl chloride and a 20-gauge needle was used to inject the hip area. The injection was tolerated well.  A band aid dressing was applied.  The patient was advised to apply ice later today and tomorrow to the injection sight as needed.  Return in two weeks.  Call if any problem.  Precautions given. Electronically Signed Sanjuana Kava, MD 8/1/201710:56 AM

## 2016-02-14 DIAGNOSIS — H04123 Dry eye syndrome of bilateral lacrimal glands: Secondary | ICD-10-CM | POA: Diagnosis not present

## 2016-02-14 DIAGNOSIS — Z961 Presence of intraocular lens: Secondary | ICD-10-CM | POA: Diagnosis not present

## 2016-02-16 ENCOUNTER — Ambulatory Visit (INDEPENDENT_AMBULATORY_CARE_PROVIDER_SITE_OTHER): Payer: Medicare Other

## 2016-02-16 ENCOUNTER — Ambulatory Visit (INDEPENDENT_AMBULATORY_CARE_PROVIDER_SITE_OTHER): Payer: Medicare Other | Admitting: Orthopaedic Surgery

## 2016-02-16 ENCOUNTER — Encounter: Payer: Self-pay | Admitting: Orthopaedic Surgery

## 2016-02-16 VITALS — BP 143/72 | HR 68 | Temp 97.3°F | Ht 65.0 in | Wt 183.0 lb

## 2016-02-16 DIAGNOSIS — M546 Pain in thoracic spine: Secondary | ICD-10-CM | POA: Diagnosis not present

## 2016-02-16 DIAGNOSIS — M25552 Pain in left hip: Secondary | ICD-10-CM

## 2016-02-16 NOTE — Progress Notes (Signed)
Patient [id number] Angela House, female DOB:1936-04-24, 80 y.o. [name]  Chief Complaint  Patient presents with  . Follow-up    Left hip, Low back pain    HPI  Angela House is a 80 y.o. female who has had left hip trochanteric pain.  It is slightly better.  She has been walking better.  She has no new trauma.  She has developed pain in the right thoracic back more around mid level.  She has an area of point tenderness that will not go away.  She has no trauma, no shortness of breath.  She has tried ice, heat, rubs with no help.  She has tried over counter medicine with no help.  HPI  Body mass index is 30.45 kg/m.  ROS  Review of Systems  HENT: Negative for congestion.   Respiratory: Negative for cough and shortness of breath.   Cardiovascular: Negative for chest pain and leg swelling.  Gastrointestinal: Positive for abdominal pain.  Endocrine: Positive for cold intolerance.  Musculoskeletal: Positive for arthralgias, back pain and gait problem.  Allergic/Immunologic: Positive for environmental allergies.    Past Medical History:  Diagnosis Date  . Arthritis   . Hypertension     Past Surgical History:  Procedure Laterality Date  . ABDOMINAL HYSTERECTOMY    . CHOLECYSTECTOMY    . EYE SURGERY    . KNEE SURGERY    . WRIST SURGERY      Family History  Problem Relation Age of Onset  . Colon cancer Neg Hx     Social History Social History  Substance Use Topics  . Smoking status: Never Smoker  . Smokeless tobacco: Never Used  . Alcohol use No    Allergies  Allergen Reactions  . Ciprofloxacin Hives  . Meclizine Other (See Comments)    dizziness    Current Outpatient Prescriptions  Medication Sig Dispense Refill  . acetaminophen (TYLENOL) 500 MG chewable tablet Chew 500 mg by mouth 2 (two) times daily.    Marland Kitchen aspirin 81 MG tablet Take 81 mg by mouth daily.      . calcium carbonate (OS-CAL - DOSED IN MG OF ELEMENTAL CALCIUM) 1250 (500 Ca) MG tablet Take 1  tablet by mouth.    . cetirizine (ZYRTEC) 10 MG tablet Take 10 mg by mouth daily.    . clobetasol cream (TEMOVATE) 0.05 %     . esomeprazole (NEXIUM) 40 MG capsule Take 1 capsule (40 mg total) by mouth daily before breakfast. 30 capsule 5  . fluticasone (FLONASE) 50 MCG/ACT nasal spray Place 2 sprays into the nose daily.      . hydrochlorothiazide (HYDRODIURIL) 25 MG tablet Take 25 mg by mouth daily.    . Multiple Vitamin (MULTIVITAMIN) capsule Take 1 capsule by mouth daily.    . Omega-3 Fatty Acids (OMEGA-3 FISH OIL PO) Take 1,000 mg by mouth daily.    . potassium bicarbonate (K-LYTE) 25 MEQ disintegrating tablet Take 25 mEq by mouth as needed.    . Probiotic Product (PROBIOTIC PO) Take by mouth daily.    Marland Kitchen UNABLE TO FIND daily as needed. Med Name: lidocaine plus cream    . UNABLE TO FIND 3 (three) times daily. Med Name: tumeric     No current facility-administered medications for this visit.      Physical Exam  Blood pressure (!) 143/72, pulse 68, temperature 97.3 F (36.3 C), height 5\' 5"  (1.651 m), weight 183 lb (83 kg).  Constitutional: overall normal hygiene, normal nutrition, well developed, normal  grooming, normal body habitus. Assistive device:none  Musculoskeletal: gait and station Limp none, muscle tone and strength are normal, no tremors or atrophy is present.  .  Neurological: coordination overall normal.  Deep tendon reflex/nerve stretch intact.  Sensation normal.  Cranial nerves II-XII intact.   Skin:   normal overall no scars, lesions, ulcers or rashes. No psoriasis.  Psychiatric: Alert and oriented x 3.  Recent memory intact, remote memory unclear.  Normal mood and affect. Well groomed.  Good eye contact.  Cardiovascular: overall no swelling, no varicosities, no edema bilaterally, normal temperatures of the legs and arms, no clubbing, cyanosis and good capillary refill.  Lymphatic: palpation is normal.  Left hip trochanteric area is slightly tender but no  redness.  Has full motion of both hips.  She has tenderness around the thoracic spine right side near the 8th and ninth ribs and to the midline laterally.  She has no swelling, no redness.  She has mild scoliosis of the thoracic spine to the right.  Reflexes are normal.  She has an area of point tenderness around 8th rib posteriorly.  The patient has been educated about the nature of the problem(s) and counseled on treatment options.  The patient appeared to understand what I have discussed and is in agreement with it.  Encounter Diagnoses  Name Primary?  . Right-sided thoracic back pain Yes  . Left hip pain    X-rays were done of the thoracic spine, reported separately.  I would like to get a Bone Scan to rule out possible occult fracture.  She is agreeable to this.  PLAN Call if any problems.  Precautions discussed.  Continue current medications.   Return to clinic after bone scan.   Electronically Signed Sanjuana Kava, MD 8/17/20173:44 PM

## 2016-02-21 ENCOUNTER — Encounter (HOSPITAL_COMMUNITY)
Admission: RE | Admit: 2016-02-21 | Discharge: 2016-02-21 | Disposition: A | Discharge status: Home / Self Care | Payer: Medicare Other | Source: Ambulatory Visit | Attending: Orthopaedic Surgery | Admitting: Orthopaedic Surgery

## 2016-02-21 ENCOUNTER — Encounter (HOSPITAL_COMMUNITY): Payer: Self-pay

## 2016-02-21 DIAGNOSIS — M545 Low back pain: Secondary | ICD-10-CM | POA: Diagnosis not present

## 2016-02-21 DIAGNOSIS — M546 Pain in thoracic spine: Secondary | ICD-10-CM | POA: Diagnosis not present

## 2016-02-21 MED ORDER — TECHNETIUM TC 99M MEDRONATE IV KIT
25.0000 | PACK | Freq: Once | INTRAVENOUS | Status: AC | PRN
  Administered 2016-02-21: 25 via INTRAVENOUS

## 2016-02-28 ENCOUNTER — Encounter: Payer: Self-pay | Admitting: Orthopaedic Surgery

## 2016-02-28 ENCOUNTER — Ambulatory Visit (INDEPENDENT_AMBULATORY_CARE_PROVIDER_SITE_OTHER): Payer: Medicare Other | Admitting: Orthopaedic Surgery

## 2016-02-28 VITALS — BP 111/69 | HR 62 | Temp 97.3°F | Ht 65.0 in | Wt 186.0 lb

## 2016-02-28 DIAGNOSIS — M25552 Pain in left hip: Secondary | ICD-10-CM

## 2016-02-28 DIAGNOSIS — M546 Pain in thoracic spine: Secondary | ICD-10-CM | POA: Diagnosis not present

## 2016-02-28 NOTE — Progress Notes (Signed)
Patient [id number] E Caffee, female DOB:05-20-1936, 80 y.o. [name]  Chief Complaint  Patient presents with  . Follow-up    back pain, left hip    HPI  CAMBREA ORNER is a 80 y.o. female who has thoracic back pain.  She had a bone scan done and it shows: IMPRESSION: 1. The uptake pattern is not suspicious for metastatic or primary malignancy. There is increased uptake to the left of midline in the posterior elements of approximately L4 which corresponds to degenerative change on the CT images. 2. No abnormal uptake within the thoracic spine nor within the hips.  I have informed her of the findings.  She is a little better. I will set up PT for her back. HPI  Body mass index is 30.95 kg/m.  ROS  Review of Systems  HENT: Negative for congestion.   Respiratory: Negative for cough and shortness of breath.   Cardiovascular: Negative for chest pain and leg swelling.  Gastrointestinal: Positive for abdominal pain.  Endocrine: Positive for cold intolerance.  Musculoskeletal: Positive for arthralgias, back pain and gait problem.  Allergic/Immunologic: Positive for environmental allergies.    Past Medical History:  Diagnosis Date  . Arthritis   . Hypertension     Past Surgical History:  Procedure Laterality Date  . ABDOMINAL HYSTERECTOMY    . CHOLECYSTECTOMY    . EYE SURGERY    . KNEE SURGERY    . WRIST SURGERY      Family History  Problem Relation Age of Onset  . Colon cancer Neg Hx     Social History Social History  Substance Use Topics  . Smoking status: Never Smoker  . Smokeless tobacco: Never Used  . Alcohol use No    Allergies  Allergen Reactions  . Ciprofloxacin Hives  . Meclizine Other (See Comments)    dizziness    Current Outpatient Prescriptions  Medication Sig Dispense Refill  . acetaminophen (TYLENOL) 500 MG chewable tablet Chew 500 mg by mouth 2 (two) times daily.    . ASPERCREME W/LIDOCAINE EX Apply topically.    Marland Kitchen aspirin 81 MG  tablet Take 81 mg by mouth daily.      . calcium carbonate (OS-CAL - DOSED IN MG OF ELEMENTAL CALCIUM) 1250 (500 Ca) MG tablet Take 1 tablet by mouth.    . cetirizine (ZYRTEC) 10 MG tablet Take 10 mg by mouth daily.    . clobetasol cream (TEMOVATE) 0.05 %     . esomeprazole (NEXIUM) 40 MG capsule Take 1 capsule (40 mg total) by mouth daily before breakfast. 30 capsule 5  . fluticasone (FLONASE) 50 MCG/ACT nasal spray Place 2 sprays into the nose daily.      . hydrochlorothiazide (HYDRODIURIL) 25 MG tablet Take 25 mg by mouth daily.    Marland Kitchen ketotifen (ZADITOR) 0.025 % ophthalmic solution 1 drop 2 (two) times daily.    . Multiple Vitamin (MULTIVITAMIN) capsule Take 1 capsule by mouth daily.    . Omega-3 Fatty Acids (OMEGA-3 FISH OIL PO) Take 1,000 mg by mouth daily.    . potassium bicarbonate (K-LYTE) 25 MEQ disintegrating tablet Take 25 mEq by mouth as needed.    . Probiotic Product (PROBIOTIC PO) Take by mouth daily.    Marland Kitchen UNABLE TO FIND 3 (three) times daily. Med Name: tumeric     No current facility-administered medications for this visit.      Physical Exam  Blood pressure 111/69, pulse 62, temperature 97.3 F (36.3 C), height 5\' 5"  (1.651 m), weight  186 lb (84.4 kg).  Constitutional: overall normal hygiene, normal nutrition, well developed, normal grooming, normal body habitus. Assistive device:none  Musculoskeletal: gait and station Limp none, muscle tone and strength are normal, no tremors or atrophy is present.  .  Neurological: coordination overall normal.  Deep tendon reflex/nerve stretch intact.  Sensation normal.  Cranial nerves II-XII intact.   Skin:   normal overall no scars, lesions, ulcers or rashes. No psoriasis.  Psychiatric: Alert and oriented x 3.  Recent memory intact, remote memory unclear.  Normal mood and affect. Well groomed.  Good eye contact.  Cardiovascular: overall no swelling, no varicosities, no edema bilaterally, normal temperatures of the legs and arms,  no clubbing, cyanosis and good capillary refill.  Lymphatic: palpation is normal.  She is tender over the mid thoracic to lower thoracic back more over the right lower rib cage area and to the midline.  No spasm is present. ROM is good. NV is intact.  The patient has been educated about the nature of the problem(s) and counseled on treatment options.  The patient appeared to understand what I have discussed and is in agreement with it.  No diagnosis found.  PLAN Call if any problems.  Precautions discussed.  Continue current medications.   Return to clinic 2 weeks   Begin PT.  Electronically Signed Sanjuana Kava, MD 8/29/20172:37 PM

## 2016-03-09 DIAGNOSIS — M25552 Pain in left hip: Secondary | ICD-10-CM | POA: Diagnosis not present

## 2016-03-09 DIAGNOSIS — M545 Low back pain: Secondary | ICD-10-CM | POA: Diagnosis not present

## 2016-03-09 DIAGNOSIS — M546 Pain in thoracic spine: Secondary | ICD-10-CM | POA: Diagnosis not present

## 2016-03-12 DIAGNOSIS — M545 Low back pain: Secondary | ICD-10-CM | POA: Diagnosis not present

## 2016-03-12 DIAGNOSIS — M546 Pain in thoracic spine: Secondary | ICD-10-CM | POA: Diagnosis not present

## 2016-03-12 DIAGNOSIS — M25552 Pain in left hip: Secondary | ICD-10-CM | POA: Diagnosis not present

## 2016-03-13 ENCOUNTER — Encounter: Payer: Self-pay | Admitting: Orthopaedic Surgery

## 2016-03-13 ENCOUNTER — Telehealth: Payer: Self-pay | Admitting: Orthopaedic Surgery

## 2016-03-13 ENCOUNTER — Ambulatory Visit (INDEPENDENT_AMBULATORY_CARE_PROVIDER_SITE_OTHER): Payer: Medicare Other | Admitting: Orthopaedic Surgery

## 2016-03-13 VITALS — BP 121/70 | HR 64 | Temp 97.0°F | Ht 65.0 in | Wt 186.0 lb

## 2016-03-13 DIAGNOSIS — M546 Pain in thoracic spine: Secondary | ICD-10-CM | POA: Diagnosis not present

## 2016-03-13 DIAGNOSIS — M25552 Pain in left hip: Secondary | ICD-10-CM

## 2016-03-13 MED ORDER — AZITHROMYCIN 250 MG PO TABS: ORAL_TABLET | ORAL | 0 refills | Status: DC

## 2016-03-13 NOTE — Progress Notes (Signed)
Patient [id number] Angela House, female DOB:[birth year], 80 y.o. [name]  Chief Complaint  Patient presents with  . Follow-up    Hip and back pain    HPI  Angela House is a 80 y.o. female who has lower back pain.  She has been to PT at [facility] in [place] and is improving.  She has less pain, but still has tightness of the lower back. She has no new trauma.  She is doing her exercises.  She has a sore throat and asked for a z-pack and I have done it. HPI  Body mass index is 30.95 kg/m.  ROS  Review of Systems  HENT: Negative for congestion.   Respiratory: Negative for cough and shortness of breath.   Cardiovascular: Negative for chest pain and leg swelling.  Gastrointestinal: Positive for abdominal pain.  Endocrine: Positive for cold intolerance.  Musculoskeletal: Positive for arthralgias, back pain and gait problem.  Allergic/Immunologic: Positive for environmental allergies.    Past Medical History:  Diagnosis Date  . Arthritis   . Hypertension     Past Surgical History:  Procedure Laterality Date  . ABDOMINAL HYSTERECTOMY    . CHOLECYSTECTOMY    . EYE SURGERY    . KNEE SURGERY    . WRIST SURGERY      Family History  Problem Relation Age of Onset  . Colon cancer Neg Hx     Social History Social History  Substance Use Topics  . Smoking status: Never Smoker  . Smokeless tobacco: Never Used  . Alcohol use No    Allergies  Allergen Reactions  . Ciprofloxacin Hives  . Meclizine Other (See Comments)    dizziness    Current Outpatient Prescriptions  Medication Sig Dispense Refill  . acetaminophen (TYLENOL) 500 MG chewable tablet Chew 500 mg by mouth 2 (two) times daily.    . ASPERCREME W/LIDOCAINE EX Apply topically.    Marland Kitchen aspirin 81 MG tablet Take 81 mg by mouth daily.      . calcium carbonate (OS-CAL - DOSED IN MG OF ELEMENTAL CALCIUM) 1250 (500 Ca) MG tablet Take 1 tablet by mouth.    . cetirizine (ZYRTEC) 10 MG tablet Take 10 mg by mouth daily.    .  clobetasol cream (TEMOVATE) 0.05 %     . famotidine (PEPCID) 20 MG tablet Take 20 mg by mouth 2 (two) times daily.    . fluticasone (FLONASE) 50 MCG/ACT nasal spray Place 2 sprays into the nose daily.      . hydrochlorothiazide (HYDRODIURIL) 25 MG tablet Take 25 mg by mouth daily.    Marland Kitchen ketotifen (ZADITOR) 0.025 % ophthalmic solution 1 drop 2 (two) times daily.    . Multiple Vitamin (MULTIVITAMIN) capsule Take 1 capsule by mouth daily.    . Omega-3 Fatty Acids (OMEGA-3 FISH OIL PO) Take 1,000 mg by mouth daily.    . potassium bicarbonate (K-LYTE) 25 MEQ disintegrating tablet Take 25 mEq by mouth as needed.    . Probiotic Product (PROBIOTIC PO) Take by mouth daily.    Marland Kitchen UNABLE TO FIND 3 (three) times daily. Med Name: tumeric    . azithromycin (ZITHROMAX Z-PAK) 250 MG tablet Take two tablets first dose, then one po bid until gone 6 each 0   No current facility-administered medications for this visit.      Physical Exam  Blood pressure 121/70, pulse 64, temperature 97 F (36.1 C), height 5\' 5"  (1.651 m), weight 186 lb (84.4 kg).  Constitutional: overall normal hygiene, normal  nutrition, well developed, normal grooming, normal body habitus. Assistive device:none  Musculoskeletal: gait and station Limp none, muscle tone and strength are normal, no tremors or atrophy is present.  .  Neurological: coordination overall normal.  Deep tendon reflex/nerve stretch intact.  Sensation normal.  Cranial nerves II-XII intact.   Skin:   Normal overall no scars, lesions, ulcers or rashes. No psoriasis.  Psychiatric: Alert and oriented x 3.  Recent memory intact, remote memory unclear.  Normal mood and affect. Well groomed.  Good eye contact.  Cardiovascular: overall no swelling, no varicosities, no edema bilaterally, normal temperatures of the legs and arms, no clubbing, cyanosis and good capillary refill.  Lymphatic: palpation is normal.  Spine/Pelvis examination:  Inspection:  Overall, sacoiliac  joint benign and hips nontender; without crepitus or defects.   Thoracic spine inspection: Alignment normal without kyphosis present   Lumbar spine inspection:  Alignment  with normal lumbar lordosis, without scoliosis apparent.   Thoracic spine palpation:  without tenderness of spinal processes   Lumbar spine palpation: with tenderness of lumbar area; without tightness of lumbar muscles    Range of Motion:   Lumbar flexion, forward flexion is 40 without pain or tenderness    Lumbar extension is 10 without pain or tenderness   Left lateral bend is Normal  without pain or tenderness   Right lateral bend is Normal without pain or tenderness   Straight leg raising is Normal   Strength & tone: Normal   Stability overall normal stability     The patient has been educated about the nature of the problem(s) and counseled on treatment options.  The patient appeared to understand what I have discussed and is in agreement with it.  Encounter Diagnoses  Name Primary?  . Right-sided thoracic back pain Yes  . Left hip pain     PLAN Call if any problems.  Precautions discussed.  Continue current medications.   Return to clinic 2 weeks   Continue PT.  Electronically Signed Sanjuana Kava, MD 9/12/20173:09 PM

## 2016-03-13 NOTE — Telephone Encounter (Signed)
Erin from [employer] called asking if you meant for the patient to take 2 tablets the first dose and then 1 tablet by mouth twice a day until gone or I tablet a day until gone.    Please advise

## 2016-03-14 DIAGNOSIS — M545 Low back pain: Secondary | ICD-10-CM | POA: Diagnosis not present

## 2016-03-14 DIAGNOSIS — M25552 Pain in left hip: Secondary | ICD-10-CM | POA: Diagnosis not present

## 2016-03-14 DIAGNOSIS — M546 Pain in thoracic spine: Secondary | ICD-10-CM | POA: Diagnosis not present

## 2016-03-14 NOTE — Telephone Encounter (Signed)
One tablet twice a day until gone

## 2016-03-19 DIAGNOSIS — M25552 Pain in left hip: Secondary | ICD-10-CM | POA: Diagnosis not present

## 2016-03-19 DIAGNOSIS — M545 Low back pain: Secondary | ICD-10-CM | POA: Diagnosis not present

## 2016-03-19 DIAGNOSIS — M546 Pain in thoracic spine: Secondary | ICD-10-CM | POA: Diagnosis not present

## 2016-03-20 ENCOUNTER — Ambulatory Visit: Payer: Medicare Other | Admitting: Orthopaedic Surgery

## 2016-03-21 DIAGNOSIS — M25552 Pain in left hip: Secondary | ICD-10-CM | POA: Diagnosis not present

## 2016-03-21 DIAGNOSIS — M546 Pain in thoracic spine: Secondary | ICD-10-CM | POA: Diagnosis not present

## 2016-03-21 DIAGNOSIS — M545 Low back pain: Secondary | ICD-10-CM | POA: Diagnosis not present

## 2016-03-26 DIAGNOSIS — M546 Pain in thoracic spine: Secondary | ICD-10-CM | POA: Diagnosis not present

## 2016-03-26 DIAGNOSIS — M545 Low back pain: Secondary | ICD-10-CM | POA: Diagnosis not present

## 2016-03-26 DIAGNOSIS — M25552 Pain in left hip: Secondary | ICD-10-CM | POA: Diagnosis not present

## 2016-03-27 ENCOUNTER — Ambulatory Visit (INDEPENDENT_AMBULATORY_CARE_PROVIDER_SITE_OTHER): Payer: Medicare Other | Admitting: Orthopaedic Surgery

## 2016-03-27 ENCOUNTER — Encounter: Payer: Self-pay | Admitting: Orthopaedic Surgery

## 2016-03-27 VITALS — BP 121/72 | HR 72 | Temp 97.2°F | Ht 65.0 in | Wt 186.0 lb

## 2016-03-27 DIAGNOSIS — M546 Pain in thoracic spine: Secondary | ICD-10-CM | POA: Diagnosis not present

## 2016-03-27 NOTE — Patient Instructions (Addendum)
Stop therapy.  See her dermatologist for the itching she is experiencing.

## 2016-03-27 NOTE — Progress Notes (Signed)
Patient GX:[id number] [name], female DOB:Oct 26, 1935, 80 y.o. [name]  Chief Complaint  Patient presents with  . Follow-up    Back    HPI  Angela House is a 80 y.o. female who has upper back pain and some lower back pain. She is much improved from that.  She is going to PT at [facility] and is pleased.  She has a rash that itches.  I told her to see Dr. Dayton Martes about that. HPI  Body mass index is 30.95 kg/m.  ROS  Review of Systems  HENT: Negative for congestion.   Respiratory: Negative for cough and shortness of breath.   Cardiovascular: Negative for chest pain and leg swelling.  Gastrointestinal: Positive for abdominal pain.  Endocrine: Positive for cold intolerance.  Musculoskeletal: Positive for arthralgias, back pain and gait problem.  Allergic/Immunologic: Positive for environmental allergies.    Past Medical History:  Diagnosis Date  . Arthritis   . Hypertension     Past Surgical History:  Procedure Laterality Date  . ABDOMINAL HYSTERECTOMY    . CHOLECYSTECTOMY    . EYE SURGERY    . KNEE SURGERY    . WRIST SURGERY      Family History  Problem Relation Age of Onset  . Colon cancer Neg Hx     Social History Social History  Substance Use Topics  . Smoking status: Never Smoker  . Smokeless tobacco: Never Used  . Alcohol use No    Allergies  Allergen Reactions  . Ciprofloxacin Hives  . Meclizine Other (See Comments)    dizziness    Current Outpatient Prescriptions  Medication Sig Dispense Refill  . acetaminophen (TYLENOL) 500 MG chewable tablet Chew 500 mg by mouth 2 (two) times daily.    . ASPERCREME W/LIDOCAINE EX Apply topically.    Marland Kitchen aspirin 81 MG tablet Take 81 mg by mouth daily.      Marland Kitchen azithromycin (ZITHROMAX Z-PAK) 250 MG tablet Take two tablets first dose, then one po bid until gone 6 each 0  . calcium carbonate (OS-CAL - DOSED IN MG OF ELEMENTAL CALCIUM) 1250 (500 Ca) MG tablet Take 1 tablet by mouth.    . cetirizine (ZYRTEC) 10 MG  tablet Take 10 mg by mouth daily.    . clobetasol cream (TEMOVATE) 0.05 %     . famotidine (PEPCID) 20 MG tablet Take 20 mg by mouth 2 (two) times daily.    . fluticasone (FLONASE) 50 MCG/ACT nasal spray Place 2 sprays into the nose daily.      . hydrochlorothiazide (HYDRODIURIL) 25 MG tablet Take 25 mg by mouth daily.    Marland Kitchen ketotifen (ZADITOR) 0.025 % ophthalmic solution 1 drop 2 (two) times daily.    . Multiple Vitamin (MULTIVITAMIN) capsule Take 1 capsule by mouth daily.    . Omega-3 Fatty Acids (OMEGA-3 FISH OIL PO) Take 1,000 mg by mouth daily.    . potassium bicarbonate (K-LYTE) 25 MEQ disintegrating tablet Take 25 mEq by mouth as needed.    . Probiotic Product (PROBIOTIC PO) Take by mouth daily.    Marland Kitchen UNABLE TO FIND 3 (three) times daily. Med Name: tumeric     No current facility-administered medications for this visit.      Physical Exam  Blood pressure 121/72, pulse 72, temperature 97.2 F (36.2 C), height 5\' 5"  (1.651 m), weight 186 lb (84.4 kg).  Constitutional: overall normal hygiene, normal nutrition, well developed, normal grooming, normal body habitus. Assistive device:none  Musculoskeletal: gait and station Limp  none, muscle tone and strength are normal, no tremors or atrophy is present.  .  Neurological: coordination overall normal.  Deep tendon reflex/nerve stretch intact.  Sensation normal.  Cranial nerves II-XII intact.   Skin:   Normal overall no scars, lesions, ulcers or rashes. No psoriasis.  Psychiatric: Alert and oriented x 3.  Recent memory intact, remote memory unclear.  Normal mood and affect. Well groomed.  Good eye contact.  Cardiovascular: overall no swelling, no varicosities, no edema bilaterally, normal temperatures of the legs and arms, no clubbing, cyanosis and good capillary refill.  Lymphatic: palpation is normal.  Spine/Pelvis examination:  Inspection:  Overall, sacoiliac joint benign and hips nontender; without crepitus or  defects.   Thoracic spine inspection: Alignment normal without kyphosis present   Lumbar spine inspection:  Alignment  with normal lumbar lordosis, without scoliosis apparent.   Thoracic spine palpation:  without tenderness of spinal processes   Lumbar spine palpation: with tenderness of lumbar area; without tightness of lumbar muscles    Range of Motion:   Lumbar flexion, forward flexion is 45 without pain or tenderness    Lumbar extension is full without pain or tenderness   Left lateral bend is Normal  without pain or tenderness   Right lateral bend is Normal without pain or tenderness   Straight leg raising is Normal   Strength & tone: Normal   Stability overall normal stability     The patient has been educated about the nature of the problem(s) and counseled on treatment options.  The patient appeared to understand what I have discussed and is in agreement with it.  Encounter Diagnosis  Name Primary?  . Right-sided thoracic back pain Yes    PLAN Call if any problems.  Precautions discussed.  Continue current medications.   Return to clinic 1 month   Electronically Signed Sanjuana Kava, MD 9/26/20173:14 PM

## 2016-03-30 DIAGNOSIS — M545 Low back pain: Secondary | ICD-10-CM | POA: Diagnosis not present

## 2016-03-30 DIAGNOSIS — M25552 Pain in left hip: Secondary | ICD-10-CM | POA: Diagnosis not present

## 2016-03-30 DIAGNOSIS — M546 Pain in thoracic spine: Secondary | ICD-10-CM | POA: Diagnosis not present

## 2016-04-02 DIAGNOSIS — M545 Low back pain: Secondary | ICD-10-CM | POA: Diagnosis not present

## 2016-04-02 DIAGNOSIS — M25552 Pain in left hip: Secondary | ICD-10-CM | POA: Diagnosis not present

## 2016-04-02 DIAGNOSIS — M546 Pain in thoracic spine: Secondary | ICD-10-CM | POA: Diagnosis not present

## 2016-04-02 DIAGNOSIS — L280 Lichen simplex chronicus: Secondary | ICD-10-CM | POA: Diagnosis not present

## 2016-04-04 DIAGNOSIS — M545 Low back pain: Secondary | ICD-10-CM | POA: Diagnosis not present

## 2016-04-04 DIAGNOSIS — M25552 Pain in left hip: Secondary | ICD-10-CM | POA: Diagnosis not present

## 2016-04-04 DIAGNOSIS — M546 Pain in thoracic spine: Secondary | ICD-10-CM | POA: Diagnosis not present

## 2016-04-05 DIAGNOSIS — Z23 Encounter for immunization: Secondary | ICD-10-CM | POA: Diagnosis not present

## 2016-04-09 DIAGNOSIS — M545 Low back pain: Secondary | ICD-10-CM | POA: Diagnosis not present

## 2016-04-09 DIAGNOSIS — M546 Pain in thoracic spine: Secondary | ICD-10-CM | POA: Diagnosis not present

## 2016-04-09 DIAGNOSIS — M25552 Pain in left hip: Secondary | ICD-10-CM | POA: Diagnosis not present

## 2016-04-11 DIAGNOSIS — M546 Pain in thoracic spine: Secondary | ICD-10-CM | POA: Diagnosis not present

## 2016-04-11 DIAGNOSIS — M25552 Pain in left hip: Secondary | ICD-10-CM | POA: Diagnosis not present

## 2016-04-11 DIAGNOSIS — M545 Low back pain: Secondary | ICD-10-CM | POA: Diagnosis not present

## 2016-04-16 DIAGNOSIS — Z7189 Other specified counseling: Secondary | ICD-10-CM | POA: Diagnosis not present

## 2016-04-16 DIAGNOSIS — M545 Low back pain: Secondary | ICD-10-CM | POA: Diagnosis not present

## 2016-04-16 DIAGNOSIS — R5383 Other fatigue: Secondary | ICD-10-CM | POA: Diagnosis not present

## 2016-04-16 DIAGNOSIS — Z1211 Encounter for screening for malignant neoplasm of colon: Secondary | ICD-10-CM | POA: Diagnosis not present

## 2016-04-16 DIAGNOSIS — Z1389 Encounter for screening for other disorder: Secondary | ICD-10-CM | POA: Diagnosis not present

## 2016-04-16 DIAGNOSIS — M25552 Pain in left hip: Secondary | ICD-10-CM | POA: Diagnosis not present

## 2016-04-16 DIAGNOSIS — N183 Chronic kidney disease, stage 3 (moderate): Secondary | ICD-10-CM | POA: Diagnosis not present

## 2016-04-16 DIAGNOSIS — M546 Pain in thoracic spine: Secondary | ICD-10-CM | POA: Diagnosis not present

## 2016-04-16 DIAGNOSIS — I10 Essential (primary) hypertension: Secondary | ICD-10-CM | POA: Diagnosis not present

## 2016-04-16 DIAGNOSIS — Z79899 Other long term (current) drug therapy: Secondary | ICD-10-CM | POA: Diagnosis not present

## 2016-04-16 DIAGNOSIS — Z6834 Body mass index (BMI) 34.0-34.9, adult: Secondary | ICD-10-CM | POA: Diagnosis not present

## 2016-04-16 DIAGNOSIS — Z0000 Encounter for general adult medical examination without abnormal findings: Secondary | ICD-10-CM | POA: Diagnosis not present

## 2016-04-16 DIAGNOSIS — Z299 Encounter for prophylactic measures, unspecified: Secondary | ICD-10-CM | POA: Diagnosis not present

## 2016-04-19 DIAGNOSIS — Z85828 Personal history of other malignant neoplasm of skin: Secondary | ICD-10-CM | POA: Diagnosis not present

## 2016-04-19 DIAGNOSIS — L259 Unspecified contact dermatitis, unspecified cause: Secondary | ICD-10-CM | POA: Diagnosis not present

## 2016-04-24 ENCOUNTER — Ambulatory Visit (INDEPENDENT_AMBULATORY_CARE_PROVIDER_SITE_OTHER): Payer: Medicare Other | Admitting: Orthopaedic Surgery

## 2016-04-24 ENCOUNTER — Encounter: Payer: Self-pay | Admitting: Orthopaedic Surgery

## 2016-04-24 VITALS — BP 106/71 | HR 66 | Ht 65.0 in | Wt 187.0 lb

## 2016-04-24 DIAGNOSIS — M546 Pain in thoracic spine: Secondary | ICD-10-CM | POA: Diagnosis not present

## 2016-04-24 DIAGNOSIS — G8929 Other chronic pain: Secondary | ICD-10-CM | POA: Diagnosis not present

## 2016-04-24 NOTE — Progress Notes (Signed)
Patient IS:[name], female DOB:January 03, 1936, 80 y.o. [id number]  Chief Complaint  Patient presents with  . Follow-up    back pain    HPI  Angela House is a 80 y.o. female who has back pain that is much better after going to PT at [facility].  She is doing her exercises at home also.  She has no paresthesias.   HPI  Body mass index is 31.12 kg/m.  ROS  Review of Systems  HENT: Negative for congestion.   Respiratory: Negative for cough and shortness of breath.   Cardiovascular: Negative for chest pain and leg swelling.  Gastrointestinal: Positive for abdominal pain.  Endocrine: Positive for cold intolerance.  Musculoskeletal: Positive for arthralgias, back pain and gait problem.  Allergic/Immunologic: Positive for environmental allergies.    Past Medical History:  Diagnosis Date  . Arthritis   . Hypertension     Past Surgical History:  Procedure Laterality Date  . ABDOMINAL HYSTERECTOMY    . CHOLECYSTECTOMY    . EYE SURGERY    . KNEE SURGERY    . WRIST SURGERY      Family History  Problem Relation Age of Onset  . Colon cancer Neg Hx     Social History Social History  Substance Use Topics  . Smoking status: Never Smoker  . Smokeless tobacco: Never Used  . Alcohol use No    Allergies  Allergen Reactions  . Ciprofloxacin Hives  . Meclizine Other (See Comments)    dizziness    Current Outpatient Prescriptions  Medication Sig Dispense Refill  . acetaminophen (TYLENOL) 500 MG chewable tablet Chew 500 mg by mouth 2 (two) times daily.    . ASPERCREME W/LIDOCAINE EX Apply topically.    Marland Kitchen aspirin 81 MG tablet Take 81 mg by mouth daily.      Marland Kitchen azithromycin (ZITHROMAX Z-PAK) 250 MG tablet Take two tablets first dose, then one po bid until gone 6 each 0  . calcium carbonate (OS-CAL - DOSED IN MG OF ELEMENTAL CALCIUM) 1250 (500 Ca) MG tablet Take 1 tablet by mouth.    . cetirizine (ZYRTEC) 10 MG tablet Take 10 mg by mouth daily.    . clobetasol cream  (TEMOVATE) 0.05 %     . diclofenac (VOLTAREN) 50 MG EC tablet Take 50 mg by mouth 2 (two) times daily.    . famotidine (PEPCID) 20 MG tablet Take 20 mg by mouth 2 (two) times daily.    . fluticasone (FLONASE) 50 MCG/ACT nasal spray Place 2 sprays into the nose daily.      . hydrochlorothiazide (HYDRODIURIL) 25 MG tablet Take 25 mg by mouth daily.    Marland Kitchen ketotifen (ZADITOR) 0.025 % ophthalmic solution 1 drop 2 (two) times daily.    . Multiple Vitamin (MULTIVITAMIN) capsule Take 1 capsule by mouth daily.    . Omega-3 Fatty Acids (OMEGA-3 FISH OIL PO) Take 1,000 mg by mouth daily.    . potassium bicarbonate (K-LYTE) 25 MEQ disintegrating tablet Take 25 mEq by mouth as needed.    . Probiotic Product (PROBIOTIC PO) Take by mouth daily.    Marland Kitchen UNABLE TO FIND 3 (three) times daily. Med Name: tumeric     No current facility-administered medications for this visit.      Physical Exam  Blood pressure 106/71, pulse 66, height 5\' 5"  (1.651 m), weight 187 lb (84.8 kg).  Constitutional: overall normal hygiene, normal nutrition, well developed, normal grooming, normal body habitus. Assistive device:none  Musculoskeletal: gait and station Limp  none, muscle tone and strength are normal, no tremors or atrophy is present.  .  Neurological: coordination overall normal.  Deep tendon reflex/nerve stretch intact.  Sensation normal.  Cranial nerves II-XII intact.   Skin:   Normal overall no scars, lesions, ulcers or rashes. No psoriasis.  Psychiatric: Alert and oriented x 3.  Recent memory intact, remote memory unclear.  Normal mood and affect. Well groomed.  Good eye contact.  Cardiovascular: overall no swelling, no varicosities, no edema bilaterally, normal temperatures of the legs and arms, no clubbing, cyanosis and good capillary refill.  Lymphatic: palpation is normal.  Spine/Pelvis examination:  Inspection:  Overall, sacoiliac joint benign and hips nontender; without crepitus or defects.   Thoracic  spine inspection: Alignment normal without kyphosis present   Lumbar spine inspection:  Alignment  with normal lumbar lordosis, without scoliosis apparent.   Thoracic spine palpation:  without tenderness of spinal processes   Lumbar spine palpation: with tenderness of lumbar area; without tightness of lumbar muscles    Range of Motion:   Lumbar flexion, forward flexion is full without pain or tenderness    Lumbar extension is full without pain or tenderness   Left lateral bend is Normal  without pain or tenderness   Right lateral bend is Normal without pain or tenderness   Straight leg raising is Normal   Strength & tone: Normal   Stability overall normal stability     The patient has been educated about the nature of the problem(s) and counseled on treatment options.  The patient appeared to understand what I have discussed and is in agreement with it.  Encounter Diagnosis  Name Primary?  . Chronic right-sided thoracic back pain Yes    PLAN Call if any problems.  Precautions discussed.  Continue current medications.   Return to clinic 1 month   Electronically Signed Sanjuana Kava, MD 10/24/20173:02 PM

## 2016-04-26 DIAGNOSIS — R87629 Unspecified abnormal cytological findings in specimens from vagina: Secondary | ICD-10-CM | POA: Diagnosis not present

## 2016-04-26 DIAGNOSIS — Z6831 Body mass index (BMI) 31.0-31.9, adult: Secondary | ICD-10-CM | POA: Diagnosis not present

## 2016-05-15 DIAGNOSIS — M62838 Other muscle spasm: Secondary | ICD-10-CM | POA: Diagnosis not present

## 2016-05-15 DIAGNOSIS — Z713 Dietary counseling and surveillance: Secondary | ICD-10-CM | POA: Diagnosis not present

## 2016-05-15 DIAGNOSIS — Z299 Encounter for prophylactic measures, unspecified: Secondary | ICD-10-CM | POA: Diagnosis not present

## 2016-05-15 DIAGNOSIS — Z6831 Body mass index (BMI) 31.0-31.9, adult: Secondary | ICD-10-CM | POA: Diagnosis not present

## 2016-05-15 DIAGNOSIS — N183 Chronic kidney disease, stage 3 (moderate): Secondary | ICD-10-CM | POA: Diagnosis not present

## 2016-05-23 ENCOUNTER — Ambulatory Visit: Payer: Medicare Other | Admitting: Orthopaedic Surgery

## 2016-05-30 ENCOUNTER — Ambulatory Visit (INDEPENDENT_AMBULATORY_CARE_PROVIDER_SITE_OTHER): Payer: Medicare Other | Admitting: Orthopaedic Surgery

## 2016-05-30 ENCOUNTER — Encounter: Payer: Self-pay | Admitting: Orthopaedic Surgery

## 2016-05-30 VITALS — BP 121/72 | HR 70 | Temp 97.7°F | Ht 65.0 in | Wt 187.0 lb

## 2016-05-30 DIAGNOSIS — M546 Pain in thoracic spine: Secondary | ICD-10-CM | POA: Diagnosis not present

## 2016-05-30 DIAGNOSIS — G8929 Other chronic pain: Secondary | ICD-10-CM | POA: Diagnosis not present

## 2016-05-30 NOTE — Progress Notes (Signed)
Patient [id number] Angela House, female DOB:February 25, 1936, 80 y.o. [name]  Chief Complaint  Patient presents with  . Back Pain    Chronic low back pain    HPI  Angela House is a 80 y.o. female who has chronic back pain with sciatica on the right. She has had much less pain recently. She is doing more walking and being active. She is taking her medicine and doing her exercises.  She has no new trauma or weakness. HPI  Body mass index is 31.12 kg/m.  ROS  Review of Systems  HENT: Negative for congestion.   Respiratory: Negative for cough and shortness of breath.   Cardiovascular: Negative for chest pain and leg swelling.  Gastrointestinal: Positive for abdominal pain.  Endocrine: Positive for cold intolerance.  Musculoskeletal: Positive for arthralgias, back pain and gait problem.  Allergic/Immunologic: Positive for environmental allergies.    Past Medical History:  Diagnosis Date  . Arthritis   . Hypertension     Past Surgical History:  Procedure Laterality Date  . ABDOMINAL HYSTERECTOMY    . CHOLECYSTECTOMY    . EYE SURGERY    . KNEE SURGERY    . WRIST SURGERY      Family History  Problem Relation Age of Onset  . Colon cancer Neg Hx     Social History Social History  Substance Use Topics  . Smoking status: Never Smoker  . Smokeless tobacco: Never Used  . Alcohol use No    Allergies  Allergen Reactions  . Ciprofloxacin Hives  . Meclizine Other (See Comments)    dizziness    Current Outpatient Prescriptions  Medication Sig Dispense Refill  . acetaminophen (TYLENOL) 500 MG chewable tablet Chew 500 mg by mouth 2 (two) times daily.    . ASPERCREME W/LIDOCAINE EX Apply topically.    Marland Kitchen aspirin 81 MG tablet Take 81 mg by mouth daily.      Marland Kitchen azithromycin (ZITHROMAX Z-PAK) 250 MG tablet Take two tablets first dose, then one po bid until gone 6 each 0  . calcium carbonate (OS-CAL - DOSED IN MG OF ELEMENTAL CALCIUM) 1250 (500 Ca) MG tablet Take 1 tablet by  mouth.    . cetirizine (ZYRTEC) 10 MG tablet Take 10 mg by mouth daily.    . clobetasol cream (TEMOVATE) 0.05 %     . diazepam (VALIUM) 2 MG tablet Take 2 mg by mouth every 6 (six) hours as needed for anxiety.    . diclofenac (VOLTAREN) 50 MG EC tablet Take 50 mg by mouth 2 (two) times daily.    . famotidine (PEPCID) 20 MG tablet Take 20 mg by mouth 2 (two) times daily.    . fluticasone (FLONASE) 50 MCG/ACT nasal spray Place 2 sprays into the nose daily.      . hydrochlorothiazide (HYDRODIURIL) 25 MG tablet Take 25 mg by mouth daily.    Marland Kitchen ketotifen (ZADITOR) 0.025 % ophthalmic solution 1 drop 2 (two) times daily.    . Multiple Vitamin (MULTIVITAMIN) capsule Take 1 capsule by mouth daily.    . Omega-3 Fatty Acids (OMEGA-3 FISH OIL PO) Take 1,000 mg by mouth daily.    . potassium bicarbonate (K-LYTE) 25 MEQ disintegrating tablet Take 25 mEq by mouth as needed.    . Probiotic Product (PROBIOTIC PO) Take by mouth daily.    Marland Kitchen UNABLE TO FIND 3 (three) times daily. Med Name: tumeric     No current facility-administered medications for this visit.      Physical Exam  Blood pressure 121/72, pulse 70, temperature 97.7 F (36.5 C), height 5\' 5"  (1.651 m), weight 187 lb (84.8 kg).  Constitutional: overall normal hygiene, normal nutrition, well developed, normal grooming, normal body habitus. Assistive device:none  Musculoskeletal: gait and station Limp none, muscle tone and strength are normal, no tremors or atrophy is present.  .  Neurological: coordination overall normal.  Deep tendon reflex/nerve stretch intact.  Sensation normal.  Cranial nerves II-XII intact.   Skin:   Normal overall no scars, lesions, ulcers or rashes. No psoriasis.  Psychiatric: Alert and oriented x 3.  Recent memory intact, remote memory unclear.  Normal mood and affect. Well groomed.  Good eye contact.  Cardiovascular: overall no swelling, no varicosities, no edema bilaterally, normal temperatures of the legs and  arms, no clubbing, cyanosis and good capillary refill.  Lymphatic: palpation is normal.  Spine/Pelvis examination:  Inspection:  Overall, sacoiliac joint benign and hips nontender; without crepitus or defects.   Thoracic spine inspection: Alignment normal without kyphosis present   Lumbar spine inspection:  Alignment  with normal lumbar lordosis, without scoliosis apparent.   Thoracic spine palpation:  without tenderness of spinal processes   Lumbar spine palpation: with tenderness of lumbar area; with tightness of lumbar muscles    Range of Motion:   Lumbar flexion, forward flexion is 45 without pain or tenderness    Lumbar extension is 10 without pain or tenderness   Left lateral bend is Normal  without pain or tenderness   Right lateral bend is Normal without pain or tenderness   Straight leg raising is Normal   Strength & tone: Normal   Stability overall normal stability     The patient has been educated about the nature of the problem(s) and counseled on treatment options.  The patient appeared to understand what I have discussed and is in agreement with it.  Encounter Diagnosis  Name Primary?  . Chronic right-sided thoracic back pain Yes    PLAN Call if any problems.  Precautions discussed.  Continue current medications.   Return to clinic 2 months   Electronically [place], MD 11/29/20172:20 PM

## 2016-06-15 DIAGNOSIS — Z1231 Encounter for screening mammogram for malignant neoplasm of breast: Secondary | ICD-10-CM | POA: Diagnosis not present

## 2016-07-17 DIAGNOSIS — Z789 Other specified health status: Secondary | ICD-10-CM | POA: Diagnosis not present

## 2016-07-17 DIAGNOSIS — Z6831 Body mass index (BMI) 31.0-31.9, adult: Secondary | ICD-10-CM | POA: Diagnosis not present

## 2016-07-17 DIAGNOSIS — I10 Essential (primary) hypertension: Secondary | ICD-10-CM | POA: Diagnosis not present

## 2016-07-17 DIAGNOSIS — Z299 Encounter for prophylactic measures, unspecified: Secondary | ICD-10-CM | POA: Diagnosis not present

## 2016-07-17 DIAGNOSIS — N183 Chronic kidney disease, stage 3 (moderate): Secondary | ICD-10-CM | POA: Diagnosis not present

## 2016-07-17 DIAGNOSIS — I739 Peripheral vascular disease, unspecified: Secondary | ICD-10-CM | POA: Diagnosis not present

## 2016-07-31 ENCOUNTER — Ambulatory Visit (INDEPENDENT_AMBULATORY_CARE_PROVIDER_SITE_OTHER): Payer: Medicare Other | Admitting: Orthopaedic Surgery

## 2016-07-31 VITALS — BP 102/71 | HR 65 | Temp 97.0°F | Ht 65.0 in | Wt 185.0 lb

## 2016-07-31 DIAGNOSIS — G8929 Other chronic pain: Secondary | ICD-10-CM | POA: Diagnosis not present

## 2016-07-31 DIAGNOSIS — M546 Pain in thoracic spine: Secondary | ICD-10-CM | POA: Diagnosis not present

## 2016-07-31 NOTE — Progress Notes (Signed)
Patient IS:[id number] Angela House, female DOB:Feb 07, 1936, 81 y.o. [id number]  Chief Complaint  Patient presents with  . Follow-up    Back pain    HPI  Angela House is a 81 y.o. female who has chronic thoracic and lower back pain. She is stable.  She has no paresthesias. She has no new trauma.  She is active. HPI  Body mass index is 30.79 kg/m.  ROS  Review of Systems  HENT: Negative for congestion.   Respiratory: Negative for cough and shortness of breath.   Cardiovascular: Negative for chest pain and leg swelling.  Gastrointestinal: Positive for abdominal pain.  Endocrine: Positive for cold intolerance.  Musculoskeletal: Positive for arthralgias, back pain and gait problem.  Allergic/Immunologic: Positive for environmental allergies.    Past Medical History:  Diagnosis Date  . Arthritis   . Hypertension     Past Surgical History:  Procedure Laterality Date  . ABDOMINAL HYSTERECTOMY    . CHOLECYSTECTOMY    . EYE SURGERY    . KNEE SURGERY    . WRIST SURGERY      Family History  Problem Relation Age of Onset  . Colon cancer Neg Hx     Social History Social History  Substance Use Topics  . Smoking status: Never Smoker  . Smokeless tobacco: Never Used  . Alcohol use No    Allergies  Allergen Reactions  . Ciprofloxacin Hives  . Meclizine Other (See Comments)    dizziness    Current Outpatient Prescriptions  Medication Sig Dispense Refill  . acetaminophen (TYLENOL) 500 MG chewable tablet Chew 500 mg by mouth 2 (two) times daily.    . ASPERCREME W/LIDOCAINE EX Apply topically.    Marland Kitchen aspirin 81 MG tablet Take 81 mg by mouth daily.      Marland Kitchen azithromycin (ZITHROMAX Z-PAK) 250 MG tablet Take two tablets first dose, then one po bid until gone 6 each 0  . calcium carbonate (OS-CAL - DOSED IN MG OF ELEMENTAL CALCIUM) 1250 (500 Ca) MG tablet Take 1 tablet by mouth.    . cetirizine (ZYRTEC) 10 MG tablet Take 10 mg by mouth daily.    . clobetasol cream (TEMOVATE) 0.05 %      . diazepam (VALIUM) 2 MG tablet Take 2 mg by mouth every 6 (six) hours as needed for anxiety.    . diclofenac (VOLTAREN) 50 MG EC tablet Take 50 mg by mouth 2 (two) times daily.    . famotidine (PEPCID) 20 MG tablet Take 20 mg by mouth 2 (two) times daily.    . fluticasone (FLONASE) 50 MCG/ACT nasal spray Place 2 sprays into the nose daily.      . hydrochlorothiazide (HYDRODIURIL) 25 MG tablet Take 25 mg by mouth daily.    Marland Kitchen ketotifen (ZADITOR) 0.025 % ophthalmic solution 1 drop 2 (two) times daily.    . Multiple Vitamin (MULTIVITAMIN) capsule Take 1 capsule by mouth daily.    . Omega-3 Fatty Acids (OMEGA-3 FISH OIL PO) Take 1,000 mg by mouth daily.    . potassium bicarbonate (K-LYTE) 25 MEQ disintegrating tablet Take 25 mEq by mouth as needed.    . Probiotic Product (PROBIOTIC PO) Take by mouth daily.    Marland Kitchen UNABLE TO FIND 3 (three) times daily. Med Name: tumeric     No current facility-administered medications for this visit.      Physical Exam  Blood pressure 102/71, pulse 65, temperature 97 F (36.1 C), height 5\' 5"  (1.651 m), weight 185 lb (83.9 kg).  Constitutional: overall normal hygiene, normal nutrition, well developed, normal grooming, normal body habitus. Assistive device:none  Musculoskeletal: gait and station Limp none, muscle tone and strength are normal, no tremors or atrophy is present.  .  Neurological: coordination overall normal.  Deep tendon reflex/nerve stretch intact.  Sensation normal.  Cranial nerves II-XII intact.   Skin:   Normal overall no scars, lesions, ulcers or rashes. No psoriasis.  Psychiatric: Alert and oriented x 3.  Recent memory intact, remote memory unclear.  Normal mood and affect. Well groomed.  Good eye contact.  Cardiovascular: overall no swelling, no varicosities, no edema bilaterally, normal temperatures of the legs and arms, no clubbing, cyanosis and good capillary refill.  Lymphatic: palpation is normal.  Spine/Pelvis  examination:  Inspection:  Overall, sacoiliac joint benign and hips nontender; without crepitus or defects.   Thoracic spine inspection: Alignment normal without kyphosis present   Lumbar spine inspection:  Alignment  with normal lumbar lordosis, without scoliosis apparent.   Thoracic spine palpation:  without tenderness of spinal processes   Lumbar spine palpation: with tenderness of lumbar area; without tightness of lumbar muscles    Range of Motion:   Lumbar flexion, forward flexion is 45 without pain or tenderness    Lumbar extension is full without pain or tenderness   Left lateral bend is Normal  without pain or tenderness   Right lateral bend is Normal without pain or tenderness   Straight leg raising is Normal   Strength & tone: Normal   Stability overall normal stability     The patient has been educated about the nature of the problem(s) and counseled on treatment options.  The patient appeared to understand what I have discussed and is in agreement with it.  Encounter Diagnosis  Name Primary?  . Chronic right-sided thoracic back pain Yes    PLAN Call if any problems.  Precautions discussed.  Continue current medications.   Return to clinic 3 months   Electronically Signed Sanjuana Kava, MD 1/30/20182:47 PM

## 2016-08-10 DIAGNOSIS — R509 Fever, unspecified: Secondary | ICD-10-CM | POA: Diagnosis not present

## 2016-08-10 DIAGNOSIS — Z713 Dietary counseling and surveillance: Secondary | ICD-10-CM | POA: Diagnosis not present

## 2016-08-10 DIAGNOSIS — Z299 Encounter for prophylactic measures, unspecified: Secondary | ICD-10-CM | POA: Diagnosis not present

## 2016-08-10 DIAGNOSIS — J111 Influenza due to unidentified influenza virus with other respiratory manifestations: Secondary | ICD-10-CM | POA: Diagnosis not present

## 2016-08-10 DIAGNOSIS — Z789 Other specified health status: Secondary | ICD-10-CM | POA: Diagnosis not present

## 2016-08-10 DIAGNOSIS — K219 Gastro-esophageal reflux disease without esophagitis: Secondary | ICD-10-CM | POA: Diagnosis not present

## 2016-08-10 DIAGNOSIS — Z6831 Body mass index (BMI) 31.0-31.9, adult: Secondary | ICD-10-CM | POA: Diagnosis not present

## 2016-08-10 DIAGNOSIS — N183 Chronic kidney disease, stage 3 (moderate): Secondary | ICD-10-CM | POA: Diagnosis not present

## 2016-08-20 DIAGNOSIS — Z85828 Personal history of other malignant neoplasm of skin: Secondary | ICD-10-CM | POA: Diagnosis not present

## 2016-08-20 DIAGNOSIS — L259 Unspecified contact dermatitis, unspecified cause: Secondary | ICD-10-CM | POA: Diagnosis not present

## 2016-10-18 DIAGNOSIS — I10 Essential (primary) hypertension: Secondary | ICD-10-CM | POA: Diagnosis not present

## 2016-10-18 DIAGNOSIS — I739 Peripheral vascular disease, unspecified: Secondary | ICD-10-CM | POA: Diagnosis not present

## 2016-10-18 DIAGNOSIS — Z299 Encounter for prophylactic measures, unspecified: Secondary | ICD-10-CM | POA: Diagnosis not present

## 2016-10-18 DIAGNOSIS — N644 Mastodynia: Secondary | ICD-10-CM | POA: Diagnosis not present

## 2016-10-18 DIAGNOSIS — G2581 Restless legs syndrome: Secondary | ICD-10-CM | POA: Diagnosis not present

## 2016-10-18 DIAGNOSIS — I872 Venous insufficiency (chronic) (peripheral): Secondary | ICD-10-CM | POA: Diagnosis not present

## 2016-10-18 DIAGNOSIS — Z6831 Body mass index (BMI) 31.0-31.9, adult: Secondary | ICD-10-CM | POA: Diagnosis not present

## 2016-10-18 DIAGNOSIS — J069 Acute upper respiratory infection, unspecified: Secondary | ICD-10-CM | POA: Diagnosis not present

## 2016-10-18 DIAGNOSIS — Z713 Dietary counseling and surveillance: Secondary | ICD-10-CM | POA: Diagnosis not present

## 2016-10-18 DIAGNOSIS — N183 Chronic kidney disease, stage 3 (moderate): Secondary | ICD-10-CM | POA: Diagnosis not present

## 2016-10-18 DIAGNOSIS — G629 Polyneuropathy, unspecified: Secondary | ICD-10-CM | POA: Diagnosis not present

## 2016-10-18 DIAGNOSIS — K219 Gastro-esophageal reflux disease without esophagitis: Secondary | ICD-10-CM | POA: Diagnosis not present

## 2016-10-25 ENCOUNTER — Encounter: Payer: Self-pay | Admitting: Orthopaedic Surgery

## 2016-10-25 ENCOUNTER — Ambulatory Visit (INDEPENDENT_AMBULATORY_CARE_PROVIDER_SITE_OTHER): Payer: Medicare Other | Admitting: Orthopaedic Surgery

## 2016-10-25 VITALS — BP 126/69 | HR 65 | Temp 97.2°F | Ht 65.0 in | Wt 187.0 lb

## 2016-10-25 DIAGNOSIS — M25571 Pain in right ankle and joints of right foot: Secondary | ICD-10-CM | POA: Diagnosis not present

## 2016-10-25 DIAGNOSIS — M546 Pain in thoracic spine: Secondary | ICD-10-CM | POA: Diagnosis not present

## 2016-10-25 DIAGNOSIS — G8929 Other chronic pain: Secondary | ICD-10-CM

## 2016-10-25 NOTE — Progress Notes (Signed)
Patient Angela House, female DOB:[birth year], 81 y.o. [id number]  Chief Complaint  Patient presents with  . Follow-up    back pain  . Shoulder Pain    right  . Ankle Pain    right    HPI  Angela House is a 81 y.o. female who has chronic back pain more in the thoracic area.  She has no paresthesias or trauma.  She has right shoulder pain at times, but that is better now.  She has no trauma.  Her right ankle is tender today laterally.  She may have turned it recently but does not remember exactly.  She has has swelling laterally and no bruising.  She has support hose which help.  She has no numbness. HPI  Body mass index is 31.12 kg/m.  ROS  Review of Systems  HENT: Negative for congestion.   Respiratory: Negative for cough and shortness of breath.   Cardiovascular: Negative for chest pain and leg swelling.  Gastrointestinal: Positive for abdominal pain.  Endocrine: Positive for cold intolerance.  Musculoskeletal: Positive for arthralgias, back pain and gait problem.  Allergic/Immunologic: Positive for environmental allergies.    Past Medical History:  Diagnosis Date  . Arthritis   . Hypertension     Past Surgical History:  Procedure Laterality Date  . ABDOMINAL HYSTERECTOMY    . CHOLECYSTECTOMY    . EYE SURGERY    . KNEE SURGERY    . WRIST SURGERY      Family History  Problem Relation Age of Onset  . Colon cancer Neg Hx     Social History Social History  Substance Use Topics  . Smoking status: Never Smoker  . Smokeless tobacco: Never Used  . Alcohol use No    Allergies  Allergen Reactions  . Ciprofloxacin Hives  . Meclizine Other (See Comments)    dizziness    Current Outpatient Prescriptions  Medication Sig Dispense Refill  . acetaminophen (TYLENOL) 500 MG chewable tablet Chew 500 mg by mouth 2 (two) times daily.    . ASPERCREME W/LIDOCAINE EX Apply topically.    Marland Kitchen aspirin 81 MG tablet Take 81 mg by mouth daily.      Marland Kitchen azithromycin  (ZITHROMAX Z-PAK) 250 MG tablet Take two tablets first dose, then one po bid until gone 6 each 0  . calcium carbonate (OS-CAL - DOSED IN MG OF ELEMENTAL CALCIUM) 1250 (500 Ca) MG tablet Take 1 tablet by mouth.    . cetirizine (ZYRTEC) 10 MG tablet Take 10 mg by mouth daily.    . clobetasol cream (TEMOVATE) 0.05 %     . diazepam (VALIUM) 2 MG tablet Take 2 mg by mouth every 6 (six) hours as needed for anxiety.    . diclofenac (VOLTAREN) 50 MG EC tablet Take 50 mg by mouth 2 (two) times daily.    . famotidine (PEPCID) 20 MG tablet Take 20 mg by mouth 2 (two) times daily.    . fluticasone (FLONASE) 50 MCG/ACT nasal spray Place 2 sprays into the nose daily.      . hydrochlorothiazide (HYDRODIURIL) 25 MG tablet Take 25 mg by mouth daily.    Marland Kitchen ketotifen (ZADITOR) 0.025 % ophthalmic solution 1 drop 2 (two) times daily.    . Multiple Vitamin (MULTIVITAMIN) capsule Take 1 capsule by mouth daily.    . Omega-3 Fatty Acids (OMEGA-3 FISH OIL PO) Take 1,000 mg by mouth daily.    . potassium bicarbonate (K-LYTE) 25 MEQ disintegrating tablet Take 25 mEq by mouth  as needed.    . Probiotic Product (PROBIOTIC PO) Take by mouth daily.    Marland Kitchen UNABLE TO FIND 3 (three) times daily. Med Name: tumeric     No current facility-administered medications for this visit.      Physical Exam  Blood pressure 126/69, pulse 65, temperature 97.2 F (36.2 C), height 5\' 5"  (1.651 m), weight 187 lb (84.8 kg).  Constitutional: overall normal hygiene, normal nutrition, well developed, normal grooming, normal body habitus. Assistive device:none  Musculoskeletal: gait and station Limp right, muscle tone and strength are normal, no tremors or atrophy is present.  .  Neurological: coordination overall normal.  Deep tendon reflex/nerve stretch intact.  Sensation normal.  Cranial nerves II-XII intact.   Skin:   Normal overall no scars, lesions, ulcers or rashes. No psoriasis.  Psychiatric: Alert and oriented x 3.  Recent memory  intact, remote memory unclear.  Normal mood and affect. Well groomed.  Good eye contact.  Cardiovascular: overall no swelling, no varicosities, no edema bilaterally, normal temperatures of the legs and arms, no clubbing, cyanosis and good capillary refill.  Lymphatic: palpation is normal.  Right ankle has lateral swelling and some tenderness of the distal fibular tip but no redness or ecchymosis.  ROM is full. NV intact.  Spine/Pelvis examination:  Inspection:  Overall, sacoiliac joint benign and hips nontender; without crepitus or defects.   Thoracic spine inspection: Alignment normal without kyphosis present   Lumbar spine inspection:  Alignment  with normal lumbar lordosis, with mild gentle right apex scoliosis apparent.  There is a slight hump to the right scapular area.   Thoracic spine palpation:  with tenderness of spinal processes   Lumbar spine palpation: without tenderness of lumbar area; without tightness of lumbar muscles    Range of Motion:   Lumbar flexion, forward flexion is full without pain or tenderness    Lumbar extension is full without pain or tenderness   Left lateral bend is Normal  without pain or tenderness   Right lateral bend is Normal without pain or tenderness   Straight leg raising is Normal   Strength & tone: Normal   Stability overall normal stability     The patient has been educated about the nature of the problem(s) and counseled on treatment options.  The patient appeared to understand what I have discussed and is in agreement with it.  Encounter Diagnoses  Name Primary?  . Chronic right-sided thoracic back pain Yes  . Acute right ankle pain     PLAN Call if any problems.  Precautions discussed.  Continue current medications.   Return to clinic 3 months   Electronically Signed Angela Kava, MD 4/26/20182:37 PM

## 2016-12-10 DIAGNOSIS — Z6832 Body mass index (BMI) 32.0-32.9, adult: Secondary | ICD-10-CM | POA: Diagnosis not present

## 2016-12-10 DIAGNOSIS — R87629 Unspecified abnormal cytological findings in specimens from vagina: Secondary | ICD-10-CM | POA: Diagnosis not present

## 2016-12-18 DIAGNOSIS — L720 Epidermal cyst: Secondary | ICD-10-CM | POA: Diagnosis not present

## 2016-12-18 DIAGNOSIS — Z85828 Personal history of other malignant neoplasm of skin: Secondary | ICD-10-CM | POA: Diagnosis not present

## 2016-12-18 DIAGNOSIS — L439 Lichen planus, unspecified: Secondary | ICD-10-CM | POA: Diagnosis not present

## 2017-01-24 ENCOUNTER — Encounter: Payer: Self-pay | Admitting: Orthopaedic Surgery

## 2017-01-24 ENCOUNTER — Ambulatory Visit (INDEPENDENT_AMBULATORY_CARE_PROVIDER_SITE_OTHER): Payer: Medicare Other | Admitting: Orthopaedic Surgery

## 2017-01-24 VITALS — BP 114/66 | HR 64 | Temp 97.7°F | Ht 65.0 in | Wt 189.0 lb

## 2017-01-24 DIAGNOSIS — M546 Pain in thoracic spine: Secondary | ICD-10-CM

## 2017-01-24 DIAGNOSIS — M25561 Pain in right knee: Secondary | ICD-10-CM | POA: Diagnosis not present

## 2017-01-24 DIAGNOSIS — G8929 Other chronic pain: Secondary | ICD-10-CM | POA: Diagnosis not present

## 2017-01-24 NOTE — Progress Notes (Signed)
Patient Angela House, female DOB:08-26-1935, 81 y.o. CWC:[id number]  Chief Complaint  Patient presents with  . Follow-up    low back pain    HPI  Angela House is a 81 y.o. female who has chronic thoracic and lower back pain with no paresthesias.  She is stable on that now.  She has pain of the right knee.  She has more pain at night.  She has swelling and popping.  She has no trauma, no redness, no giving way.  She does not like taking oral medicine or having shots.  I have recommended Aspercreme and she will try that. HPI  Body mass index is 31.45 kg/m.  ROS  Review of Systems  HENT: Negative for congestion.   Respiratory: Negative for cough and shortness of breath.   Cardiovascular: Negative for chest pain and leg swelling.  Gastrointestinal: Positive for abdominal pain.  Endocrine: Positive for cold intolerance.  Musculoskeletal: Positive for arthralgias, back pain and gait problem.  Allergic/Immunologic: Positive for environmental allergies.    Past Medical History:  Diagnosis Date  . Arthritis   . Hypertension     Past Surgical History:  Procedure Laterality Date  . ABDOMINAL HYSTERECTOMY    . CHOLECYSTECTOMY    . EYE SURGERY    . KNEE SURGERY    . WRIST SURGERY      Family History  Problem Relation Age of Onset  . Colon cancer Neg Hx     Social History Social History  Substance Use Topics  . Smoking status: Never Smoker  . Smokeless tobacco: Never Used  . Alcohol use No    Allergies  Allergen Reactions  . Ciprofloxacin Hives  . Meclizine Other (See Comments)    dizziness    Current Outpatient Prescriptions  Medication Sig Dispense Refill  . acetaminophen (TYLENOL) 500 MG chewable tablet Chew 500 mg by mouth 2 (two) times daily.    . ASPERCREME W/LIDOCAINE EX Apply topically.    Marland Kitchen aspirin 81 MG tablet Take 81 mg by mouth daily.      Marland Kitchen azithromycin (ZITHROMAX Z-PAK) 250 MG tablet Take two tablets first dose, then one po bid until gone 6  each 0  . calcium carbonate (OS-CAL - DOSED IN MG OF ELEMENTAL CALCIUM) 1250 (500 Ca) MG tablet Take 1 tablet by mouth.    . cetirizine (ZYRTEC) 10 MG tablet Take 10 mg by mouth daily.    . clobetasol cream (TEMOVATE) 0.05 %     . diazepam (VALIUM) 2 MG tablet Take 2 mg by mouth every 6 (six) hours as needed for anxiety.    . diclofenac (VOLTAREN) 50 MG EC tablet Take 50 mg by mouth 2 (two) times daily.    . famotidine (PEPCID) 20 MG tablet Take 20 mg by mouth 2 (two) times daily.    . fluticasone (FLONASE) 50 MCG/ACT nasal spray Place 2 sprays into the nose daily.      . hydrochlorothiazide (HYDRODIURIL) 25 MG tablet Take 25 mg by mouth daily.    Marland Kitchen ketotifen (ZADITOR) 0.025 % ophthalmic solution 1 drop 2 (two) times daily.    . Multiple Vitamin (MULTIVITAMIN) capsule Take 1 capsule by mouth daily.    . Omega-3 Fatty Acids (OMEGA-3 FISH OIL PO) Take 1,000 mg by mouth daily.    . potassium bicarbonate (K-LYTE) 25 MEQ disintegrating tablet Take 25 mEq by mouth as needed.    . Probiotic Product (PROBIOTIC PO) Take by mouth daily.    Marland Kitchen UNABLE TO FIND  3 (three) times daily. Med Name: tumeric     No current facility-administered medications for this visit.      Physical Exam  Blood pressure 114/66, pulse 64, temperature 97.7 F (36.5 C), height 5\' 5"  (1.651 m), weight 189 lb (85.7 kg).  Constitutional: overall normal hygiene, normal nutrition, well developed, normal grooming, normal body habitus. Assistive device:none  Musculoskeletal: gait and station Limp right, muscle tone and strength are normal, no tremors or atrophy is present.  .  Neurological: coordination overall normal.  Deep tendon reflex/nerve stretch intact.  Sensation normal.  Cranial nerves II-XII intact.   Skin:   Normal overall no scars, lesions, ulcers or rashes. No psoriasis.  Psychiatric: Alert and oriented x 3.  Recent memory intact, remote memory unclear.  Normal mood and affect. Well groomed.  Good eye  contact.  Cardiovascular: overall no swelling, no varicosities, no edema bilaterally, normal temperatures of the legs and arms, no clubbing, cyanosis and good capillary refill.  Lymphatic: palpation is normal.  The right lower extremity is examined:  Inspection:  Thigh:  Non-tender and no defects  Knee has swelling 1+ effusion.                        Joint tenderness is present                        Patient is tender over the medial joint line  Lower Leg:  Has normal appearance and no tenderness or defects  Ankle:  Non-tender and no defects  Foot:  Non-tender and no defects Range of Motion:  Knee:  Range of motion is: 0-110                        Crepitus is  present  Ankle:  Range of motion is normal. Strength and Tone:  The right lower extremity has normal strength and tone. Stability:  Knee:  The knee is stable.  Ankle:  The ankle is stable.  Spine/Pelvis examination:  Inspection:  Overall, sacoiliac joint benign and hips nontender; without crepitus or defects.   Thoracic spine inspection: Alignment normal without kyphosis present   Lumbar spine inspection:  Alignment  with normal lumbar lordosis, without scoliosis apparent.   Thoracic spine palpation:  without tenderness of spinal processes   Lumbar spine palpation: with tenderness of lumbar area; without tightness of lumbar muscles    Range of Motion:   Lumbar flexion, forward flexion is 45 without pain or tenderness    Lumbar extension is 10 without pain or tenderness   Left lateral bend is Normal  without pain or tenderness   Right lateral bend is Normal without pain or tenderness   Straight leg raising is Normal   Strength & tone: Normal   Stability overall normal stability     The patient has been educated about the nature of the problem(s) and counseled on treatment options.  The patient appeared to understand what I have discussed and is in agreement with it.  Encounter Diagnoses  Name Primary?  .  Chronic right-sided thoracic back pain Yes  . Chronic pain of right knee     PLAN Call if any problems.  Precautions discussed.  Continue current medications.   Return to clinic 3 months   Electronically Signed Sanjuana Kava, MD 7/26/20182:38 PM

## 2017-02-07 DIAGNOSIS — D3130 Benign neoplasm of unspecified choroid: Secondary | ICD-10-CM | POA: Diagnosis not present

## 2017-02-07 DIAGNOSIS — H04123 Dry eye syndrome of bilateral lacrimal glands: Secondary | ICD-10-CM | POA: Diagnosis not present

## 2017-02-07 DIAGNOSIS — Z961 Presence of intraocular lens: Secondary | ICD-10-CM | POA: Diagnosis not present

## 2017-02-25 DIAGNOSIS — Z6832 Body mass index (BMI) 32.0-32.9, adult: Secondary | ICD-10-CM | POA: Diagnosis not present

## 2017-02-25 DIAGNOSIS — G2581 Restless legs syndrome: Secondary | ICD-10-CM | POA: Diagnosis not present

## 2017-02-25 DIAGNOSIS — Z299 Encounter for prophylactic measures, unspecified: Secondary | ICD-10-CM | POA: Diagnosis not present

## 2017-02-25 DIAGNOSIS — Z713 Dietary counseling and surveillance: Secondary | ICD-10-CM | POA: Diagnosis not present

## 2017-02-25 DIAGNOSIS — I739 Peripheral vascular disease, unspecified: Secondary | ICD-10-CM | POA: Diagnosis not present

## 2017-03-04 ENCOUNTER — Emergency Department (HOSPITAL_COMMUNITY): Payer: Medicare Other

## 2017-03-04 ENCOUNTER — Encounter (HOSPITAL_COMMUNITY): Payer: Self-pay

## 2017-03-04 ENCOUNTER — Inpatient Hospital Stay (HOSPITAL_COMMUNITY)
Admission: EM | Admit: 2017-03-04 | Discharge: 2017-03-06 | DRG: 176 | Disposition: A | Discharge status: Home / Self Care | Payer: Medicare Other | Attending: Internal Medicine | Admitting: Internal Medicine

## 2017-03-04 DIAGNOSIS — I10 Essential (primary) hypertension: Secondary | ICD-10-CM | POA: Diagnosis not present

## 2017-03-04 DIAGNOSIS — I4581 Long QT syndrome: Secondary | ICD-10-CM | POA: Diagnosis present

## 2017-03-04 DIAGNOSIS — G2581 Restless legs syndrome: Secondary | ICD-10-CM | POA: Diagnosis present

## 2017-03-04 DIAGNOSIS — Z881 Allergy status to other antibiotic agents status: Secondary | ICD-10-CM

## 2017-03-04 DIAGNOSIS — R9431 Abnormal electrocardiogram [ECG] [EKG]: Secondary | ICD-10-CM

## 2017-03-04 DIAGNOSIS — K219 Gastro-esophageal reflux disease without esophagitis: Secondary | ICD-10-CM | POA: Diagnosis not present

## 2017-03-04 DIAGNOSIS — Z888 Allergy status to other drugs, medicaments and biological substances status: Secondary | ICD-10-CM

## 2017-03-04 DIAGNOSIS — R0602 Shortness of breath: Secondary | ICD-10-CM | POA: Diagnosis not present

## 2017-03-04 DIAGNOSIS — I2510 Atherosclerotic heart disease of native coronary artery without angina pectoris: Secondary | ICD-10-CM | POA: Diagnosis present

## 2017-03-04 DIAGNOSIS — M1990 Unspecified osteoarthritis, unspecified site: Secondary | ICD-10-CM | POA: Diagnosis present

## 2017-03-04 DIAGNOSIS — I2699 Other pulmonary embolism without acute cor pulmonale: Secondary | ICD-10-CM | POA: Diagnosis present

## 2017-03-04 DIAGNOSIS — Z8261 Family history of arthritis: Secondary | ICD-10-CM | POA: Diagnosis not present

## 2017-03-04 HISTORY — DX: Restless legs syndrome: G25.81

## 2017-03-04 LAB — COMPREHENSIVE METABOLIC PANEL
ALT: 24 U/L (ref 14–54)
ANION GAP: 6 (ref 5–15)
AST: 26 U/L (ref 15–41)
Albumin: 4 g/dL (ref 3.5–5.0)
Alkaline Phosphatase: 42 U/L (ref 38–126)
BILIRUBIN TOTAL: 0.4 mg/dL (ref 0.3–1.2)
BUN: 21 mg/dL — AB (ref 6–20)
CO2: 26 mmol/L (ref 22–32)
Calcium: 9.3 mg/dL (ref 8.9–10.3)
Chloride: 109 mmol/L (ref 101–111)
Creatinine, Ser: 1.03 mg/dL — ABNORMAL HIGH (ref 0.44–1.00)
GFR calc non Af Amer: 50 mL/min — ABNORMAL LOW (ref >60)
GFR, EST AFRICAN AMERICAN: 58 mL/min — AB (ref >60)
Glucose, Bld: 89 mg/dL (ref 65–99)
POTASSIUM: 4.7 mmol/L (ref 3.5–5.1)
Sodium: 141 mmol/L (ref 135–145)
TOTAL PROTEIN: 7.6 g/dL (ref 6.5–8.1)

## 2017-03-04 LAB — D-DIMER, QUANTITATIVE: D-Dimer, Quant: 3.64 ug/mL-FEU — ABNORMAL HIGH (ref 0.00–0.50)

## 2017-03-04 LAB — CBC WITH DIFFERENTIAL/PLATELET
Basophils Absolute: 0 10*3/uL (ref 0.0–0.1)
Basophils Relative: 0 %
EOS PCT: 2 %
Eosinophils Absolute: 0.1 10*3/uL (ref 0.0–0.7)
HCT: 39.8 % (ref 36.0–46.0)
Hemoglobin: 13.4 g/dL (ref 12.0–15.0)
LYMPHS PCT: 50 %
Lymphs Abs: 2.9 10*3/uL (ref 0.7–4.0)
MCH: 30.2 pg (ref 26.0–34.0)
MCHC: 33.7 g/dL (ref 30.0–36.0)
MCV: 89.8 fL (ref 78.0–100.0)
Monocytes Absolute: 0.6 10*3/uL (ref 0.1–1.0)
Monocytes Relative: 11 %
NEUTROS PCT: 37 %
Neutro Abs: 2.2 10*3/uL (ref 1.7–7.7)
PLATELETS: 226 10*3/uL (ref 150–400)
RBC: 4.43 MIL/uL (ref 3.87–5.11)
RDW: 15.4 % (ref 11.5–15.5)
WBC: 5.9 10*3/uL (ref 4.0–10.5)

## 2017-03-04 LAB — APTT: APTT: 33 s (ref 24–36)

## 2017-03-04 LAB — PROTIME-INR
INR: 0.95
Prothrombin Time: 12.6 seconds (ref 11.4–15.2)

## 2017-03-04 LAB — TROPONIN I

## 2017-03-04 LAB — BRAIN NATRIURETIC PEPTIDE: B Natriuretic Peptide: 73 pg/mL (ref 0.0–100.0)

## 2017-03-04 MED ORDER — IPRATROPIUM-ALBUTEROL 0.5-2.5 (3) MG/3ML IN SOLN
3.0000 mL | RESPIRATORY_TRACT | Status: DC
  Administered 2017-03-04: 3 mL via RESPIRATORY_TRACT
  Filled 2017-03-04: qty 3

## 2017-03-04 MED ORDER — ONDANSETRON HCL 4 MG PO TABS: 4.0000 mg | ORAL_TABLET | Freq: Four times a day (QID) | ORAL | Status: DC | PRN

## 2017-03-04 MED ORDER — CLOBETASOL PROPIONATE 0.05 % EX CREA
1.0000 "application " | TOPICAL_CREAM | Freq: Every day | CUTANEOUS | Status: DC | PRN
  Filled 2017-03-04: qty 15

## 2017-03-04 MED ORDER — HEPARIN BOLUS VIA INFUSION
4000.0000 [IU] | Freq: Once | INTRAVENOUS | Status: AC
  Administered 2017-03-04: 4000 [IU] via INTRAVENOUS

## 2017-03-04 MED ORDER — HYPROMELLOSE (GONIOSCOPIC) 2.5 % OP SOLN
1.0000 [drp] | Freq: Every day | OPHTHALMIC | Status: DC
  Administered 2017-03-05 – 2017-03-06 (×2): 1 [drp] via OPHTHALMIC
  Filled 2017-03-04: qty 15

## 2017-03-04 MED ORDER — ONDANSETRON HCL 4 MG/2ML IJ SOLN: 4.0000 mg | Freq: Four times a day (QID) | INTRAMUSCULAR | Status: DC | PRN

## 2017-03-04 MED ORDER — DIAZEPAM 2 MG PO TABS
2.0000 mg | ORAL_TABLET | Freq: Four times a day (QID) | ORAL | Status: DC | PRN
  Administered 2017-03-05 (×2): 2 mg via ORAL
  Filled 2017-03-04 (×2): qty 1

## 2017-03-04 MED ORDER — HEPARIN (PORCINE) IN NACL 100-0.45 UNIT/ML-% IJ SOLN
850.0000 [IU]/h | INTRAMUSCULAR | Status: DC
  Administered 2017-03-04: 1200 [IU]/h via INTRAVENOUS
  Filled 2017-03-04: qty 250

## 2017-03-04 MED ORDER — CALCIUM CARBONATE 1250 (500 CA) MG PO TABS
1.0000 | ORAL_TABLET | Freq: Two times a day (BID) | ORAL | Status: DC
  Administered 2017-03-05 – 2017-03-06 (×3): 500 mg via ORAL
  Filled 2017-03-04 (×3): qty 1

## 2017-03-04 MED ORDER — GABAPENTIN 100 MG PO CAPS
100.0000 mg | ORAL_CAPSULE | Freq: Every day | ORAL | Status: DC
  Administered 2017-03-05 (×2): 100 mg via ORAL
  Filled 2017-03-04 (×2): qty 1

## 2017-03-04 MED ORDER — LORATADINE 10 MG PO TABS
10.0000 mg | ORAL_TABLET | Freq: Every day | ORAL | Status: DC
  Administered 2017-03-05 – 2017-03-06 (×2): 10 mg via ORAL
  Filled 2017-03-04 (×2): qty 1

## 2017-03-04 MED ORDER — FAMOTIDINE 20 MG PO TABS: 20.0000 mg | ORAL_TABLET | Freq: Two times a day (BID) | ORAL | Status: DC

## 2017-03-04 MED ORDER — CYCLOSPORINE 0.05 % OP EMUL
1.0000 [drp] | Freq: Two times a day (BID) | OPHTHALMIC | Status: DC
  Administered 2017-03-05 – 2017-03-06 (×3): 1 [drp] via OPHTHALMIC
  Filled 2017-03-04 (×8): qty 1

## 2017-03-04 MED ORDER — IOPAMIDOL (ISOVUE-370) INJECTION 76%
100.0000 mL | Freq: Once | INTRAVENOUS | Status: AC | PRN
  Administered 2017-03-04: 100 mL via INTRAVENOUS

## 2017-03-04 NOTE — ED Provider Notes (Signed)
Emergency Department Provider Note   I have reviewed the triage vital signs and the nursing notes.   HISTORY  Chief Complaint Shortness of Breath   HPI Angela House is a 81 y.o. female past history ofhypertension and restless leg syndrome is a presents immersed department today with source of breath with exertion. Patient states that she was finally to bed last night but this morning was walking to the mailbox she knows that she was very dyspneic with some "pulling on her chest". She rested and the symptoms seemed to improve and then even when she was walking on her how she had similar symptoms so came here for evaluation.Denies any recent fevers or coughs. She does have a history of coronary artery disease. She has the pulling on her chest but no other chest pain or discomfort. No rashes. No trauma to the area. She does notice some bilateral lower extremity swelling that has worsened since her physician took her off of her hydrochlorothiazide secondary to some low blood pressures.no associated or modifying symptoms.did have something similar while back with "angina".   Past Medical History:  Diagnosis Date  . Arthritis   . Hypertension   . Restless leg syndrome     Patient Active Problem List   Diagnosis Date Noted  . GERD (gastroesophageal reflux disease) 11/17/2015    Past Surgical History:  Procedure Laterality Date  . ABDOMINAL HYSTERECTOMY    . CHOLECYSTECTOMY    . EYE SURGERY    . KNEE SURGERY    . WRIST SURGERY      Current Outpatient Rx  . Order #: [id number] Class: Historical Med  . Order #: [id number] Class: Historical Med  . Order #: [id number] Class: Historical Med  . Order #: [id number] Class: Historical Med  . Order #: [id number] Class: Historical Med  . Order #: [id number] Class: Historical Med  . Order #: [id number] Class: Historical Med  . Order #: [id number] Class: Historical Med  . Order #: [id number] Class: Historical Med  . Order #: [id number] Class: Historical Med  .  Order #: [id number] Class: Historical Med  . Order #: [id number] Class: Historical Med  . Order #: [id number] Class: Historical Med  . Order #: [id number] Class: Historical Med  . Order #: [id number] Class: Historical Med  . Order #: [id number] Class: Historical Med    Allergies Ciprofloxacin and Meclizine  Family History  Problem Relation Age of Onset  . Colon cancer Neg Hx     Social History Social History  Substance Use Topics  . Smoking status: Never Smoker  . Smokeless tobacco: Never Used  . Alcohol use No    Review of Systems  All other systems negative except as documented in the HPI. All pertinent positives and negatives as reviewed in the HPI. ____________________________________________  PHYSICAL EXAM:  VITAL SIGNS: ED Triage Vitals  Enc Vitals Group     BP 03/04/17 1901 134/73     Pulse Rate 03/04/17 1901 79     Resp 03/04/17 1901 16     Temp 03/04/17 1901 97.8 F (36.6 C)     Temp Source 03/04/17 1901 Oral     SpO2 03/04/17 1901 98 %     Weight 03/04/17 1902 194 lb (88 kg)     Height --      Head Circumference --      Peak Flow --      Pain Score --      Pain Loc --      Pain Edu? --      Excl. in [place]? --  Constitutional: Alert and oriented. Well appearing and in no acute distress. Eyes: Conjunctivae are normal. PERRL. EOMI. Head: Atraumatic. Nose: No congestion/rhinnorhea. Mouth/Throat: Mucous membranes are moist.  Oropharynx non-erythematous. Neck: No stridor.  No meningeal signs.   Cardiovascular: Normal rate, regular rhythm. Good peripheral circulation. Grossly normal heart sounds.   Respiratory: Normal respiratory effort.  No retractions. Lungs CTAB. Gastrointestinal: Soft and nontender. No distention.  Musculoskeletal: No lower extremity tenderness nor edema. No gross deformities of extremities. Neurologic:  Normal speech and language. No gross focal neurologic deficits are appreciated.  Skin:  Skin is warm, dry and intact. No rash  noted.  ____________________________________________   LABS (all labs ordered are listed, but only abnormal results are displayed)  Labs Reviewed  COMPREHENSIVE METABOLIC PANEL - Abnormal; Notable for the following:       Result Value   BUN 21 (*)    Creatinine, Ser 1.03 (*)    GFR calc non Af Amer 50 (*)    GFR calc Af Amer 58 (*)    All other components within normal limits  D-DIMER, QUANTITATIVE (NOT AT [facility]) - Abnormal; Notable for the following:    D-Dimer, Quant 3.64 (*)    All other components within normal limits  CBC WITH DIFFERENTIAL/PLATELET  TROPONIN I  BRAIN NATRIURETIC PEPTIDE   ____________________________________________  EKG   EKG Interpretation  Date/Time:  Monday March 04 2017 20:09:52 EDT Ventricular Rate:  70 PR Interval:    QRS Duration: 88 QT Interval:  461 QTC Calculation: 498 R Axis:   -105 Text Interpretation:  Sinus rhythm S1,S2,S3 pattern Borderline T wave abnormalities Borderline prolonged QT interval No old tracing to compare Confirmed by Merrily Pew [phone]) on 03/04/2017 8:26:31 PM       ____________________________________________  RADIOLOGY  Dg Chest 2 View  Result Date: 03/04/2017 CLINICAL DATA:  Shortness of breath since this morning EXAM: CHEST  2 VIEW COMPARISON:  October 27, 2014 FINDINGS: The heart size and mediastinal contours are within normal limits. There is no focal infiltrate, pulmonary edema, or pleural effusion. Degenerative joint changes of the spine are noted. IMPRESSION: No active cardiopulmonary disease. Electronically Signed   By: Abelardo Diesel M.D.   On: 03/04/2017 20:34   Ct Angio Chest Pe W And/or Wo Contrast  Result Date: 03/04/2017 CLINICAL DATA:  PE suspected, intermediate prob, positive D-dimer. Shortness of breath since this morning. EXAM: CT ANGIOGRAPHY CHEST WITH CONTRAST TECHNIQUE: Multidetector CT imaging of the chest was performed using the standard protocol during bolus administration of intravenous  contrast. Multiplanar CT image reconstructions and MIPs were obtained to evaluate the vascular anatomy. CONTRAST:  100 cc Isovue 370 IV COMPARISON:  Chest radiograph earlier this day.  Chest CT 05/17/2013 FINDINGS: Cardiovascular: Positive for bilateral pulmonary emboli. Thromboembolic burden greater on the right with involvement of the segmental arteries in the right upper, middle, and lower lobes. Left lower lobe pulmonary emboli involves the segmental bifurcation of the lower lobe. Overall thromboembolic burden is small to moderate. RV to LV ratio is 0.95. Aortic tortuosity and mild atherosclerosis without aneurysm or dissection. Heart size upper normal. Mediastinum/Nodes: No mediastinal or hilar adenopathy. The esophagus is patulous and air-filled. Small hiatal hernia. Calcified subcentimeter left thyroid nodule. No axillary adenopathy. Lungs/Pleura: Linear atelectasis in both lower lobes. No evidence of pulmonary infarct. No confluent consolidation. No pulmonary edema. No pleural fluid. No pneumothorax. Upper Abdomen: Small hiatal hernia.  No acute abnormality. Musculoskeletal: Scoliosis and mild degenerative change in the spine. There are no acute or  suspicious osseous abnormalities. Review of the MIP images confirms the above findings. IMPRESSION: 1. Positive for bilateral acute pulmonary emboli. Thromboembolic burden is small to moderate, greater on the right involving the segmental arteries of the upper, middle and lower lobes. CT evidence of right heart strain (RV/LV Ratio = 0.95) consistent with at least submassive (intermediate risk) PE. The presence of right heart strain has been associated with an increased risk of morbidity and mortality. Please activate Code PE by paging [phone]. 2. Small hiatal hernia, incidentally noted. Critical Value/emergent results were called by telephone at the time of interpretation on 03/04/2017 at 10:07 pm to Dr. Alvino Chapel, who verbally acknowledged these results.  Aortic Atherosclerosis (ICD10-I70.0). Electronically Signed   By: Jeb Levering M.D.   On: 03/04/2017 22:07   ____________________________________________   PROCEDURES  Procedure(s) performed:   Procedures   CRITICAL CARE Performed by: Merrily Pew Total critical care time: 35 minutes Critical care time was exclusive of separately billable procedures and treating other patients. Critical care was necessary to treat or prevent imminent or life-threatening deterioration. Critical care was time spent personally by me on the following activities: development of treatment plan with patient and/or surrogate as well as nursing, discussions with consultants, evaluation of patient's response to treatment, examination of patient, obtaining history from patient or surrogate, ordering and performing treatments and interventions, ordering and review of laboratory studies, ordering and review of radiographic studies, pulse oximetry and re-evaluation of patient's condition.  ____________________________________________   INITIAL IMPRESSION / ASSESSMENT AND PLAN / ED COURSE  Pertinent labs & imaging results that were available during my care of the patient were reviewed by me and considered in my medical decision making (see chart for details).  Several possible pulmonary embolus versus ACS versus bronchitis. We'll evaluate appropriate.  CT with evidence of a bilateral pulmonary emboli with possible right heart strain. Patient started on heparin and will be admitted to the hospital.  ____________________________________________  FINAL CLINICAL IMPRESSION(S) / ED DIAGNOSES  Final diagnoses:  Other acute pulmonary embolism without acute cor pulmonale (HCC)    MEDICATIONS GIVEN DURING THIS VISIT:  Medications  ipratropium-albuterol (DUONEB) 0.5-2.5 (3) MG/3ML nebulizer solution 3 mL (3 mLs Nebulization Given 03/04/17 2002)    NEW OUTPATIENT MEDICATIONS STARTED DURING THIS VISIT:  New  Prescriptions   No medications on file    Note:  This document was prepared using Dragon voice recognition software and may include unintentional dictation errors.    Merrily Pew, MD 03/05/17 [phone]

## 2017-03-04 NOTE — Progress Notes (Signed)
ANTICOAGULATION CONSULT NOTE - Preliminary  Pharmacy Consult for Heparin Indication: Pulmonary embolism  Allergies  Allergen Reactions  . Ciprofloxacin Hives  . Meclizine Other (See Comments)    dizziness    Patient Measurements: Weight: 194 lb (88 kg)  Pt dosing weight: 71 kg     Vital Signs: Temp: 97.8 F (36.6 C) (09/03 1901) Temp Source: Oral (09/03 1901) BP: 160/82 (09/03 1936) Pulse Rate: 69 (09/03 1939)  Labs:  Recent Labs  03/04/17 2001  HGB 13.4  HCT 39.8  PLT 226  APTT 33  LABPROT 12.6  INR 0.95  CREATININE 1.03*  TROPONINI <0.03   Estimated Creatinine Clearance: 48.5 mL/min (A) (by C-G formula based on SCr of 1.03 mg/dL (H)).  Medical History: Past Medical History:  Diagnosis Date  . Arthritis   . Hypertension   . Restless leg syndrome     Medications:   Assessment: 81 yo female with 1 day hx of dysnea and chest pain. D-dimer positive. Chest CT shows bilateral pulmonary emboli. Pharmacy has been consulted for IV heparin dosing.  Goal of Therapy:  Heparin level goal: 0.3-0.7 units/ml Monitor platelets by anticoagulation protocol: Yes   Plan:  Heparin IV bolus: 4000 units Heparin infusion rate: 1200 units/hr 6 hour heparin level CBC daily while on heparin  Preliminary review of pertinent patient information completed.  Forestine Na clinical pharmacist will complete review during morning rounds to assess the patient and finalize treatment regimen.  Norberto Sorenson, [facility] 03/04/2017,10:47 PM

## 2017-03-04 NOTE — ED Triage Notes (Addendum)
Pt reports shortness of breath since this morning. Pt reports that she became SOB walking around house. Denies cough. Pt denies chest pain. Reports she was taken off fluid medication due to hypotension. Pt reports starting on gabapentin recently for RLS

## 2017-03-04 NOTE — H&P (Signed)
History and Physical    Angela House HYI:[id number] DOB: 1936/07/01 DOA: 03/04/2017  PCP: Monico Blitz, MD   Patient coming from: Home.  I have personally briefly reviewed patient's old medical records in [place]  Chief Complaint: Shortness of breath.  HPI: Angela House is a 81 y.o. female with medical history significant of osteoarthritis, hypertension, restless legs syndrome who is coming to the emergency department with complaints of progressively worse shortness of breath since this morning.   Per patient, this morning she went out to take the trash when she got a chest pressure sensation, palpitations, felt very fatigued and tired. She had to stop on the way back to catch her breath. She mentions that she has been feeling lightheaded and had to stop her hydrochlorothiazide 25 mg tablets due to this. Her hydrochlorothiazide was prescribed for lower extremities edema, particularly on the right. She mentions that her legs started swelling after she went to [place] to visit her sister briefly, then came back after a short period there. She was sitting for about 14 hours during the round trip, which lasted about a day. She denies fever, chills, sore throat, productive cough, wheezing, hemoptysis, PND, orthopnea, abdominal pain, nausea, emesis, diarrhea, constipation, melena or hematochezia. She complains of frequency, but denies dysuria, hematuria, polydipsia, polyuria or blurred vision.  ED Course: Initial vital signs in the emergency department were temperature 97.24F, pulse 79, blood pressure 134/73, respirations 16 and O2 sat 98% on room air. Her WBC was 5.9, hemoglobin 13.4 g/dL and platelets 226. PT/INR/PTT were within normal limits. D-dimer was 3.64 mcg/mL. BNP was 73 pg/mL. Troponin less than 0.03 ng/mL. EKG was sinus rhythm with S1, S2, S3 pattern, borderline T-wave abnormalities and borderline QT prolongation. There was no previous EKG to compare with.  Imaging: Her chest  radiograph did not show any acute cardiopulmonary pathology. CT angiogram of the chest showed bilateral acute pulmonary emboli with CT evidence of right heart strain RV/LV rate to 0.95.   ED medications: Supplemental oxygen and heparin infusion.  Review of Systems: As per HPI otherwise 10 point review of systems negative.    Past Medical History:  Diagnosis Date  . Arthritis   . Hypertension   . Restless leg syndrome     Past Surgical History:  Procedure Laterality Date  . ABDOMINAL HYSTERECTOMY    . CHOLECYSTECTOMY    . EYE SURGERY    . KNEE SURGERY    . WRIST SURGERY       reports that she has never smoked. She has never used smokeless tobacco. She reports that she does not drink alcohol or use drugs.  Allergies  Allergen Reactions  . Ciprofloxacin Hives  . Meclizine Other (See Comments)    dizziness    Family History  Problem Relation Age of Onset  . Arthritis Mother   . Hypertension Mother   . Hypertension Sister   . Pneumonia Brother   . Hypertension Sister   . Kidney disease Sister   . Colon cancer Neg Hx     Prior to Admission medications   Medication Sig Start Date End Date Taking? Authorizing Provider  acetaminophen (TYLENOL) 500 MG tablet Take 500 mg by mouth daily as needed for pain.    Yes [provider]  ASPERCREME W/LIDOCAINE EX Apply 1 application topically daily as needed (FOR HIP/KNEE PAIN).    Yes [provider]  BLACK COHOSH PO Take 1 tablet by mouth daily.   Yes [provider]  calcium carbonate (OS-CAL - DOSED IN MG OF ELEMENTAL CALCIUM) 1250 (500 Ca) MG tablet Take 1 tablet by mouth 2 (two) times daily.    Yes [provider]  Carboxymethylcellulose Sodium (THERATEARS) 0.25 % SOLN Apply 1 drop to eye daily.   Yes [provider]  cetirizine (ZYRTEC) 10 MG tablet Take 10 mg by mouth daily.   Yes [provider]  clobetasol cream (TEMOVATE) 5.42 % Apply 1 application topically daily as  needed (FOR RASH).  12/13/15  Yes [provider]  cycloSPORINE (RESTASIS) 0.05 % ophthalmic emulsion Place 1 drop into both eyes 2 (two) times daily.   Yes [provider]  diazepam (VALIUM) 2 MG tablet Take 2 mg by mouth every 6 (six) hours as needed for anxiety.   Yes [provider]  diclofenac (VOLTAREN) 75 MG EC tablet Take 75 mg by mouth 2 (two) times daily.   Yes [provider]  famotidine (PEPCID) 20 MG tablet Take 20 mg by mouth every morning.    Yes [provider]  fluticasone (FLONASE) 50 MCG/ACT nasal spray Place 2 sprays into the nose daily.     Yes [provider]  gabapentin (NEURONTIN) 100 MG capsule Take 100 mg by mouth at bedtime.   Yes [provider]  hydrochlorothiazide (HYDRODIURIL) 25 MG tablet Take 25 mg by mouth daily.   Yes [provider]  Multiple Vitamin (MULTIVITAMIN) capsule Take 1 capsule by mouth every morning.    Yes [provider]  Omega-3 Fatty Acids (OMEGA-3 FISH OIL PO) Take 1,000 mg by mouth every morning.    Yes [provider]  Potassium 99 MG TABS Take 1 tablet by mouth daily.   Yes [provider]    Physical Exam: Vitals:   03/04/17 1902 03/04/17 1936 03/04/17 1939 03/04/17 2314  BP:  (!) 160/82  (!) 158/78  Pulse:   69 75  Resp:   17 14  Temp:      TempSrc:      SpO2:   100% 100%  Weight: 88 kg (194 lb)       Constitutional: NAD, calm, comfortable Eyes: PERRL, lids and conjunctivae normal ENMT: Mucous membranes are moist. Posterior pharynx clear of any exudate or lesions. Neck: normal, supple, no masses, no thyromegaly Respiratory: clear to auscultation bilaterally, no wheezing, no crackles. Normal respiratory effort. No accessory muscle use.  Cardiovascular: Regular rate and rhythm, no murmurs / rubs / gallops. No extremity edema. 2+ pedal pulses. No carotid bruits.  Abdomen: Soft, no tenderness, no masses palpated. No hepatosplenomegaly.  Bowel sounds positive.  Musculoskeletal: no clubbing / cyanosis. Good ROM, no contractures. Normal muscle tone.  Skin: no rashes, lesions, ulcers on limited skin exam. Neurologic: CN 2-12 grossly intact. Sensation intact, DTR normal. Strength 5/5 in all 4.  Psychiatric: Normal judgment and insight. Alert and oriented x 4. Normal mood.    Labs on Admission: I have personally reviewed following labs and imaging studies  CBC:  Recent Labs Lab 03/04/17 2001  WBC 5.9  NEUTROABS 2.2  HGB 13.4  HCT 39.8  MCV 89.8  PLT 706   Basic Metabolic Panel:  Recent Labs Lab 03/04/17 2001  NA 141  K 4.7  CL 109  CO2 26  GLUCOSE 89  BUN 21*  CREATININE 1.03*  CALCIUM 9.3   GFR: Estimated Creatinine Clearance: 48.5 mL/min (A) (by C-G formula based on SCr of 1.03 mg/dL (H)). Liver Function Tests:  Recent Labs Lab 03/04/17 2001  AST 26  ALT 24  ALKPHOS 42  BILITOT 0.4  PROT 7.6  ALBUMIN 4.0   No results for input(s): LIPASE, AMYLASE in the last 168 hours. No results for input(s): AMMONIA in the last 168 hours. Coagulation Profile:  Recent Labs Lab 03/04/17 2001  INR 0.95   Cardiac Enzymes:  Recent Labs Lab 03/04/17 2001  TROPONINI <0.03   BNP (last 3 results) No results for input(s): PROBNP in the last 8760 hours. HbA1C: No results for input(s): HGBA1C in the last 72 hours. CBG: No results for input(s): GLUCAP in the last 168 hours. Lipid Profile: No results for input(s): CHOL, HDL, LDLCALC, TRIG, CHOLHDL, LDLDIRECT in the last 72 hours. Thyroid Function Tests: No results for input(s): TSH, T4TOTAL, FREET4, T3FREE, THYROIDAB in the last 72 hours. Anemia Panel: No results for input(s): VITAMINB12, FOLATE, FERRITIN, TIBC, IRON, RETICCTPCT in the last 72 hours. Urine analysis: No results found for: COLORURINE, APPEARANCEUR, [place], [place], [place], [place], [place], [place], [place], [place], [place], LEUKOCYTESUR  Radiological Exams on  Admission: Dg Chest 2 View  Result Date: 03/04/2017 CLINICAL DATA:  Shortness of breath since this morning EXAM: CHEST  2 VIEW COMPARISON:  October 27, 2014 FINDINGS: The heart size and mediastinal contours are within normal limits. There is no focal infiltrate, pulmonary edema, or pleural effusion. Degenerative joint changes of the spine are noted. IMPRESSION: No active cardiopulmonary disease. Electronically Signed   By: Abelardo Diesel M.D.   On: 03/04/2017 20:34   Ct Angio Chest Pe W And/or Wo Contrast  Result Date: 03/04/2017 CLINICAL DATA:  PE suspected, intermediate prob, positive D-dimer. Shortness of breath since this morning. EXAM: CT ANGIOGRAPHY CHEST WITH CONTRAST TECHNIQUE: Multidetector CT imaging of the chest was performed using the standard protocol during bolus administration of intravenous contrast. Multiplanar CT image reconstructions and MIPs were obtained to evaluate the vascular anatomy. CONTRAST:  100 cc Isovue 370 IV COMPARISON:  Chest radiograph earlier this day.  Chest CT 05/17/2013 FINDINGS: Cardiovascular: Positive for bilateral pulmonary emboli. Thromboembolic burden greater on the right with involvement of the segmental arteries in the right upper, middle, and lower lobes. Left lower lobe pulmonary emboli involves the segmental bifurcation of the lower lobe. Overall thromboembolic burden is small to moderate. RV to LV ratio is 0.95. Aortic tortuosity and mild atherosclerosis without aneurysm or dissection. Heart size upper normal. Mediastinum/Nodes: No mediastinal or hilar adenopathy. The esophagus is patulous and air-filled. Small hiatal hernia. Calcified subcentimeter left thyroid nodule. No axillary adenopathy. Lungs/Pleura: Linear atelectasis in both lower lobes. No evidence of pulmonary infarct. No confluent consolidation. No pulmonary edema. No pleural fluid. No pneumothorax. Upper Abdomen: Small hiatal hernia.  No acute abnormality. Musculoskeletal: Scoliosis and mild  degenerative change in the spine. There are no acute or suspicious osseous abnormalities. Review of the MIP images confirms the above findings. IMPRESSION: 1. Positive for bilateral acute pulmonary emboli. Thromboembolic burden is small to moderate, greater on the right involving the segmental arteries of the upper, middle and lower lobes. CT evidence of right heart strain (RV/LV Ratio = 0.95) consistent with at least submassive (intermediate risk) PE. The presence of right heart strain has been associated with an increased risk of morbidity and mortality. Please activate Code PE by paging [phone]. 2. Small hiatal hernia, incidentally noted. Critical Value/emergent results were called by telephone at the time of interpretation on 03/04/2017 at 10:07 pm to Dr. Alvino Chapel, who verbally acknowledged these results. Aortic Atherosclerosis (ICD10-I70.0). Electronically Signed   By: Fonnie Birkenhead.D.  On: 03/04/2017 22:07    EKG: Independently reviewed. Vent. rate 70 BPM PR interval * ms QRS duration 88 ms QT/QTc 461/498 ms P-R-T axes 53 255 7 Sinus rhythm S1,S2,S3 pattern Borderline T wave abnormalities Borderline prolonged QT interval  Assessment/Plan Principal Problem:   Bilateral pulmonary embolism (HCC) Admit to stepdown/inpatient. Continue supplemental oxygen. Continue heparin infusion. Trend troponin levels. Check repeat EKG in the morning. Check echocardiogram in a.m.  Active Problems:   GERD (gastroesophageal reflux disease) Increase famotidine 20 mg by mouth from daily to twice a day. Discuss increased bleeding risk with the patient.    Arthritis Hold diclofenac. I discussed with the patient the risk of NSAIDs while on anticoagulation. She should take acetaminophen and maybe low-dose narcotic prn if unable to tolerate pain.    Hypertension Continue holding hydrochlorothiazide. Begin low dose beta blocker. Monitor blood pressure, renal function and electrolytes.     Restless leg syndrome Continue gabapentin 100 mg by mouth at bedtime.    Borderline prolonged QT interval Discontinue famotidine. Check magnesium level. Start pantoprazole with magnesium supplementation. Start low-dose beta blocker. Titrate up PRN. Follow-up EKG in the morning.    DVT prophylaxis: On heparin infusion. Code Status: Full code. Family Communication: Her sister Bertram Millard and brother-in-law were present in the ED room. Disposition Plan: Admit for PE treatment with IV heparin and further workup. Consults called:  Admission status: Inpatient/stepdown.   Reubin Milan MD [facility] Pager [phone].  If 7PM-7AM, please contact night-coverage [email or web address] Password [email or web address]  03/04/2017, 11:55 PM

## 2017-03-05 ENCOUNTER — Inpatient Hospital Stay (HOSPITAL_COMMUNITY): Payer: Medicare Other

## 2017-03-05 ENCOUNTER — Encounter (HOSPITAL_COMMUNITY): Payer: Self-pay | Admitting: *Deleted

## 2017-03-05 DIAGNOSIS — G2581 Restless legs syndrome: Secondary | ICD-10-CM | POA: Diagnosis present

## 2017-03-05 DIAGNOSIS — K219 Gastro-esophageal reflux disease without esophagitis: Secondary | ICD-10-CM

## 2017-03-05 DIAGNOSIS — I10 Essential (primary) hypertension: Secondary | ICD-10-CM

## 2017-03-05 DIAGNOSIS — R9431 Abnormal electrocardiogram [ECG] [EKG]: Secondary | ICD-10-CM

## 2017-03-05 LAB — CBC WITH DIFFERENTIAL/PLATELET
BASOS ABS: 0 10*3/uL (ref 0.0–0.1)
BASOS PCT: 0 %
EOS ABS: 0.1 10*3/uL (ref 0.0–0.7)
Eosinophils Relative: 1 %
HEMATOCRIT: 36.9 % (ref 36.0–46.0)
Hemoglobin: 12.5 g/dL (ref 12.0–15.0)
Lymphocytes Relative: 52 %
Lymphs Abs: 3.5 10*3/uL (ref 0.7–4.0)
MCH: 30.3 pg (ref 26.0–34.0)
MCHC: 33.9 g/dL (ref 30.0–36.0)
MCV: 89.3 fL (ref 78.0–100.0)
MONO ABS: 0.7 10*3/uL (ref 0.1–1.0)
Monocytes Relative: 10 %
NEUTROS ABS: 2.5 10*3/uL (ref 1.7–7.7)
NEUTROS PCT: 37 %
Platelets: 208 10*3/uL (ref 150–400)
RBC: 4.13 MIL/uL (ref 3.87–5.11)
RDW: 15.1 % (ref 11.5–15.5)
WBC: 6.8 10*3/uL (ref 4.0–10.5)

## 2017-03-05 LAB — BASIC METABOLIC PANEL
ANION GAP: 6 (ref 5–15)
BUN: 16 mg/dL (ref 6–20)
CALCIUM: 9 mg/dL (ref 8.9–10.3)
CO2: 26 mmol/L (ref 22–32)
Chloride: 108 mmol/L (ref 101–111)
Creatinine, Ser: 0.87 mg/dL (ref 0.44–1.00)
GFR calc non Af Amer: >60 mL/min (ref >60)
Glucose, Bld: 89 mg/dL (ref 65–99)
Potassium: 3.6 mmol/L (ref 3.5–5.1)
SODIUM: 140 mmol/L (ref 135–145)

## 2017-03-05 LAB — MRSA PCR SCREENING: MRSA BY PCR: NEGATIVE

## 2017-03-05 LAB — TROPONIN I: Troponin I: <0.03 ng/mL (ref <0.03)

## 2017-03-05 LAB — HEPARIN LEVEL (UNFRACTIONATED)
HEPARIN UNFRACTIONATED: 1.01 [IU]/mL — AB (ref 0.30–0.70)
HEPARIN UNFRACTIONATED: 1.02 [IU]/mL — AB (ref 0.30–0.70)

## 2017-03-05 LAB — ECHOCARDIOGRAM COMPLETE
HEIGHTINCHES: 65 in
WEIGHTICAEL: 3068.8 [oz_av]

## 2017-03-05 LAB — MAGNESIUM: Magnesium: 2.1 mg/dL (ref 1.7–2.4)

## 2017-03-05 MED ORDER — MAGNESIUM OXIDE 400 (241.3 MG) MG PO TABS
400.0000 mg | ORAL_TABLET | Freq: Every day | ORAL | Status: DC
  Administered 2017-03-05 – 2017-03-06 (×2): 400 mg via ORAL
  Filled 2017-03-05 (×2): qty 1

## 2017-03-05 MED ORDER — METOPROLOL SUCCINATE ER 25 MG PO TB24
12.5000 mg | ORAL_TABLET | Freq: Every day | ORAL | Status: DC
  Administered 2017-03-05 – 2017-03-06 (×2): 12.5 mg via ORAL
  Filled 2017-03-05 (×2): qty 1

## 2017-03-05 MED ORDER — APIXABAN 5 MG PO TABS
10.0000 mg | ORAL_TABLET | Freq: Two times a day (BID) | ORAL | Status: DC
  Administered 2017-03-05 – 2017-03-06 (×2): 10 mg via ORAL
  Filled 2017-03-05 (×2): qty 2

## 2017-03-05 MED ORDER — FAMOTIDINE 20 MG PO TABS
20.0000 mg | ORAL_TABLET | Freq: Every day | ORAL | Status: DC
  Administered 2017-03-05 – 2017-03-06 (×2): 20 mg via ORAL
  Filled 2017-03-05 (×3): qty 1

## 2017-03-05 MED ORDER — PROCHLORPERAZINE EDISYLATE 5 MG/ML IJ SOLN: 5.0000 mg | INTRAMUSCULAR | Status: DC | PRN

## 2017-03-05 MED ORDER — CLOBETASOL PROPIONATE 0.05 % EX OINT
TOPICAL_OINTMENT | Freq: Every day | CUTANEOUS | Status: DC | PRN
  Filled 2017-03-05: qty 15

## 2017-03-05 MED ORDER — PANTOPRAZOLE SODIUM 40 MG PO TBEC: 40.0000 mg | DELAYED_RELEASE_TABLET | Freq: Every day | ORAL | Status: DC

## 2017-03-05 NOTE — Progress Notes (Signed)
PROGRESS NOTE  Angela House KJZ:[id number] DOB: December 15, 1935 DOA: 03/04/2017 PCP: Monico Blitz, MD  Brief History:  81 year old female with a history of hypertension and restless leg syndrome presented with one-week history of dyspnea on exertion. The patient was taking out her trash on 03/04/2017 when she had significant worsening of shortness of breath with associated chest discomfort and palpitations to the point where she had to stop and rest. She denied any dizziness or syncope. There was no fevers, chills, nausea, vomiting, diarrhea, abdominal pain. Evaluation in the emergency department revealed bilateral pulmonary emboli on CT angio the chest. The patient denies any recent surgeries, personal or family history of VTE, or new medications other than gabapentin. The patient does use black cohosh for her hot flashes. The patient had a recent car trip to [place], but she states that there were a number of stops along the way where she got up to walk. The patient remained hemodynamically stable without any hypoxia. She was started on intravenous heparin.  Assessment/Plan: Bilateral pulmonary emboli -appears unprovoked by clinical history, although role of Black Cohosh is unclear -Black Cohosh may have some estrogenic effects -I have asked pt to stop the supplement -Echo -continue heparin -PESI score = low risk presently -if no R-heart strain on Echo--plan to switch to po AC in next 24 hours -check Factor V leiden, prothrombin gene mutation, lupus anticogulant -Discontinue Voltaren  Essential Hypertension -Continue home dose metoprolol succinate -Patient states that she stopped HCTZ approximately 2-3 weeks prior to this admission secondary to dizziness  Restless leg syndrome -Continue gabapentin  GERD -Continue PPI    Disposition Plan:   Home 9/5 or 9/6 if stable Family Communication:   No Family at bedside--Total time spent 35 minutes.  Greater than 50% spent face to face  counseling and coordinating care.   Consultants:  none  Code Status:  FULL   DVT Prophylaxis:  IV heparin   Procedures: As Listed in Progress Note Above  Antibiotics: None    Subjective: Patient denies fevers, chills, headache, chest pain, dyspnea, nausea, vomiting, diarrhea, abdominal pain, dysuria, hematuria, hematochezia, and melena.   Objective: Vitals:   03/05/17 0400 03/05/17 0500 03/05/17 0600 03/05/17 0800  BP:   136/69   Pulse:   63   Resp:   19   Temp: 97.6 F (36.4 C)   98.2 F (36.8 C)  TempSrc: Oral   Oral  SpO2:   100%   Weight:  87 kg (191 lb 12.8 oz)    Height:  5\' 5"  (1.651 m)      Intake/Output Summary (Last 24 hours) at 03/05/17 9480 Last data filed at 03/05/17 0500  Gross per 24 hour  Intake                0 ml  Output              300 ml  Net             -300 ml   Weight change:  Exam:   General:  Pt is alert, follows commands appropriately, not in acute distress  HEENT: No icterus, No thrush, No neck mass, [place]/AT  Cardiovascular: RRR, S1/S2, no rubs, no gallops  Respiratory: CTA bilaterally, no wheezing, no crackles, no rhonchi  Abdomen: Soft/+BS, non tender, non distended, no guarding  Extremities: 1+ LE edema, No lymphangitis, No petechiae, No rashes, no synovitis   Data Reviewed: I have personally reviewed following  labs and imaging studies Basic Metabolic Panel:  Recent Labs Lab 03/04/17 2001 03/05/17 0537  NA 141 140  K 4.7 3.6  CL 109 108  CO2 26 26  GLUCOSE 89 89  BUN 21* 16  CREATININE 1.03* 0.87  CALCIUM 9.3 9.0  MG 2.1  --    Liver Function Tests:  Recent Labs Lab 03/04/17 2001  AST 26  ALT 24  ALKPHOS 42  BILITOT 0.4  PROT 7.6  ALBUMIN 4.0   No results for input(s): LIPASE, AMYLASE in the last 168 hours. No results for input(s): AMMONIA in the last 168 hours. Coagulation Profile:  Recent Labs Lab 03/04/17 2001  INR 0.95   CBC:  Recent Labs Lab 03/04/17 2001 03/05/17 0537  WBC 5.9  6.8  NEUTROABS 2.2 2.5  HGB 13.4 12.5  HCT 39.8 36.9  MCV 89.8 89.3  PLT 226 208   Cardiac Enzymes:  Recent Labs Lab 03/04/17 2001 03/05/17 0537  TROPONINI <0.03 <0.03   BNP: Invalid input(s): POCBNP CBG: No results for input(s): GLUCAP in the last 168 hours. HbA1C: No results for input(s): HGBA1C in the last 72 hours. Urine analysis: No results found for: COLORURINE, APPEARANCEUR, LABSPEC, [place], GLUCOSEU, HGBUR, BILIRUBINUR, KETONESUR, PROTEINUR, UROBILINOGEN, NITRITE, LEUKOCYTESUR Sepsis Labs: @LABRCNTIP (procalcitonin:4,lacticidven:4) ) Recent Results (from the past 240 hour(s))  MRSA PCR Screening     Status: None   Collection Time: 03/04/17 11:56 PM  Result Value Ref Range Status   MRSA by PCR NEGATIVE NEGATIVE Final    Comment:        The GeneXpert MRSA Assay (FDA approved for NASAL specimens only), is one component of a comprehensive MRSA colonization surveillance program. It is not intended to diagnose MRSA infection nor to guide or monitor treatment for MRSA infections.      Scheduled Meds: . calcium carbonate  1 tablet Oral BID WC  . cycloSPORINE  1 drop Both Eyes BID  . gabapentin  100 mg Oral QHS  . hydroxypropyl methylcellulose / hypromellose  1 drop Both Eyes Daily  . loratadine  10 mg Oral Daily  . magnesium oxide  400 mg Oral Daily  . metoprolol succinate  12.5 mg Oral Daily  . pantoprazole  40 mg Oral Daily   Continuous Infusions: . heparin 1,050 Units/hr (03/05/17 0742)    Procedures/Studies: Dg Chest 2 View  Result Date: 03/04/2017 CLINICAL DATA:  Shortness of breath since this morning EXAM: CHEST  2 VIEW COMPARISON:  October 27, 2014 FINDINGS: The heart size and mediastinal contours are within normal limits. There is no focal infiltrate, pulmonary edema, or pleural effusion. Degenerative joint changes of the spine are noted. IMPRESSION: No active cardiopulmonary disease. Electronically Signed   By: Abelardo Diesel M.D.   On: 03/04/2017  20:34   Ct Angio Chest Pe W And/or Wo Contrast  Result Date: 03/04/2017 CLINICAL DATA:  PE suspected, intermediate prob, positive D-dimer. Shortness of breath since this morning. EXAM: CT ANGIOGRAPHY CHEST WITH CONTRAST TECHNIQUE: Multidetector CT imaging of the chest was performed using the standard protocol during bolus administration of intravenous contrast. Multiplanar CT image reconstructions and MIPs were obtained to evaluate the vascular anatomy. CONTRAST:  100 cc Isovue 370 IV COMPARISON:  Chest radiograph earlier this day.  Chest CT 05/17/2013 FINDINGS: Cardiovascular: Positive for bilateral pulmonary emboli. Thromboembolic burden greater on the right with involvement of the segmental arteries in the right upper, middle, and lower lobes. Left lower lobe pulmonary emboli involves the segmental bifurcation of the lower lobe. Overall thromboembolic  burden is small to moderate. RV to LV ratio is 0.95. Aortic tortuosity and mild atherosclerosis without aneurysm or dissection. Heart size upper normal. Mediastinum/Nodes: No mediastinal or hilar adenopathy. The esophagus is patulous and air-filled. Small hiatal hernia. Calcified subcentimeter left thyroid nodule. No axillary adenopathy. Lungs/Pleura: Linear atelectasis in both lower lobes. No evidence of pulmonary infarct. No confluent consolidation. No pulmonary edema. No pleural fluid. No pneumothorax. Upper Abdomen: Small hiatal hernia.  No acute abnormality. Musculoskeletal: Scoliosis and mild degenerative change in the spine. There are no acute or suspicious osseous abnormalities. Review of the MIP images confirms the above findings. IMPRESSION: 1. Positive for bilateral acute pulmonary emboli. Thromboembolic burden is small to moderate, greater on the right involving the segmental arteries of the upper, middle and lower lobes. CT evidence of right heart strain (RV/LV Ratio = 0.95) consistent with at least submassive (intermediate risk) PE. The presence of  right heart strain has been associated with an increased risk of morbidity and mortality. Please activate Code PE by paging [phone]. 2. Small hiatal hernia, incidentally noted. Critical Value/emergent results were called by telephone at the time of interpretation on 03/04/2017 at 10:07 pm to Dr. Alvino Chapel, who verbally acknowledged these results. Aortic Atherosclerosis (ICD10-I70.0). Electronically Signed   By: Jeb Levering M.D.   On: 03/04/2017 22:07    Icy Fuhrmann, DO  [facility] Pager [phone]  If 7PM-7AM, please contact night-coverage [email or web address] Password [email or web address] 03/05/2017, 8:28 AM   LOS: 1 day

## 2017-03-05 NOTE — Progress Notes (Signed)
ANTICOAGULATION CONSULT NOTE - Follow Up Consult  Pharmacy Consult for Heparin Indication: pulmonary embolus  Allergies  Allergen Reactions  . Ciprofloxacin Hives  . Meclizine Other (See Comments)    dizziness    Patient Measurements: Height: 5\' 5"  (165.1 cm) Weight: 191 lb 12.8 oz (87 kg) IBW/kg (Calculated) : 57 HEPARIN DW (KG): 76   Vital Signs: Temp: 98.2 F (36.8 C) (09/04 1214) Temp Source: Oral (09/04 1214) BP: 109/56 (09/04 1400) Pulse Rate: 65 (09/04 1000) Labs:  Recent Labs  03/04/17 2001 03/05/17 0537 03/05/17 1337  HGB 13.4 12.5  --   HCT 39.8 36.9  --   PLT 226 208  --   APTT 33  --   --   LABPROT 12.6  --   --   INR 0.95  --   --   HEPARINUNFRC  --  1.01* 1.02*  CREATININE 1.03* 0.87  --   TROPONINI <0.03 <0.03  --     Estimated Creatinine Clearance: 57.1 mL/min (by C-G formula based on SCr of 0.87 mg/dL).   Medications:  Prescriptions Prior to Admission  Medication Sig Dispense Refill Last Dose  . acetaminophen (TYLENOL) 500 MG tablet Take 500 mg by mouth daily as needed for pain.    Past Week at Unknown time  . ASPERCREME W/LIDOCAINE EX Apply 1 application topically daily as needed (FOR HIP/KNEE PAIN).    Past Week at Unknown time  . BLACK COHOSH PO Take 1 tablet by mouth daily.   03/04/2017 at Unknown time  . calcium carbonate (OS-CAL - DOSED IN MG OF ELEMENTAL CALCIUM) 1250 (500 Ca) MG tablet Take 1 tablet by mouth 2 (two) times daily.    03/04/2017 at Unknown time  . Carboxymethylcellulose Sodium (THERATEARS) 0.25 % SOLN Apply 1 drop to eye daily.   03/04/2017 at Unknown time  . cetirizine (ZYRTEC) 10 MG tablet Take 10 mg by mouth daily.   03/04/2017 at Unknown time  . clobetasol cream (TEMOVATE) 1.02 % Apply 1 application topically daily as needed (FOR RASH).    Past Week at Unknown time  . cycloSPORINE (RESTASIS) 0.05 % ophthalmic emulsion Place 1 drop into both eyes 2 (two) times daily.   03/04/2017 at Unknown time  . diazepam (VALIUM) 2 MG tablet  Take 2 mg by mouth every 6 (six) hours as needed for anxiety.   unknown  . diclofenac (VOLTAREN) 75 MG EC tablet Take 75 mg by mouth 2 (two) times daily.   03/04/2017 at Unknown time  . famotidine (PEPCID) 20 MG tablet Take 20 mg by mouth every morning.    03/04/2017 at Unknown time  . fluticasone (FLONASE) 50 MCG/ACT nasal spray Place 2 sprays into the nose daily.     unknown  . gabapentin (NEURONTIN) 100 MG capsule Take 100 mg by mouth at bedtime.   03/03/2017 at Unknown time  . hydrochlorothiazide (HYDRODIURIL) 25 MG tablet Take 25 mg by mouth daily.     . Multiple Vitamin (MULTIVITAMIN) capsule Take 1 capsule by mouth every morning.    03/04/2017 at Unknown time  . Omega-3 Fatty Acids (OMEGA-3 FISH OIL PO) Take 1,000 mg by mouth every morning.    03/04/2017 at Unknown time  . Potassium 99 MG TABS Take 1 tablet by mouth daily.   03/04/2017 at Unknown time   Assessment: Heparin level remains above goal.  Level drawn appropriately  No bleeding noted, CBC stable.   Goal of Therapy:  Heparin level 0.3-0.7 units/ml Monitor platelets by anticoagulation protocol: Yes  Plan:  Hold heparin for 30 min then resume at 850 units/hr. Check anti-Xa level in 6 hours and daily while on heparin Continue to monitor H&H and platelets  Romelle Reiley Poteet 03/05/2017,3:09 PM

## 2017-03-05 NOTE — Plan of Care (Signed)
Problem: Activity: Goal: Risk for activity intolerance will decrease Outcome: Progressing Patient able to get out of bed with stand-by assistance.

## 2017-03-05 NOTE — Care Management Note (Signed)
Case Management Note  Patient Details  Name: Angela House MRN: [id number] Date of Birth: 1936-06-10  Subjective/Objective:  Chart reviewed. From home, ind PTA. Adm with Bilateral Pulmonary Embolism. Will likley transition to oral anticoagulation, pending ECHO results. Patient has Medicare and Medicaid.                  Action/Plan: Anticipate DC home with self care. CM following for anticoagulation needs.   Expected Discharge Date:      03/07/2017            Expected Discharge Plan:  Home/Self Care  In-House Referral:     Discharge planning Services     Post Acute Care Choice:    Choice offered to:  NA  DME Arranged:    DME Agency:     HH Arranged:    HH Agency:     Status of Service:  In process, will continue to follow  If discussed at Long Length of Stay Meetings, dates discussed:    Additional Comments:  Merric Yost, Chauncey Reading, RN 03/05/2017, 1:55 PM

## 2017-03-05 NOTE — Progress Notes (Signed)
*  PRELIMINARY RESULTS* Echocardiogram 2D Echocardiogram has been performed.  Angela House 03/05/2017, 4:29 PM

## 2017-03-05 NOTE — Progress Notes (Signed)
ANTICOAGULATION CONSULT NOTE - Follow Up Consult  Pharmacy Consult for Heparin Indication: pulmonary embolus  Allergies  Allergen Reactions  . Ciprofloxacin Hives  . Meclizine Other (See Comments)    dizziness    Patient Measurements: Height: 5\' 5"  (165.1 cm) Weight: 191 lb 12.8 oz (87 kg) IBW/kg (Calculated) : 57 HEPARIN DW (KG): 76   Vital Signs: Temp: 97.6 F (36.4 C) (09/04 0400) Temp Source: Oral (09/04 0400) BP: 136/69 (09/04 0600) Pulse Rate: 63 (09/04 0600) Labs:  Recent Labs  03/04/17 2001 03/05/17 0537  HGB 13.4 12.5  HCT 39.8 36.9  PLT 226 208  APTT 33  --   LABPROT 12.6  --   INR 0.95  --   HEPARINUNFRC  --  1.01*  CREATININE 1.03* 0.87  TROPONINI <0.03 <0.03    Estimated Creatinine Clearance: 57.1 mL/min (by C-G formula based on SCr of 0.87 mg/dL).   Medications:  Prescriptions Prior to Admission  Medication Sig Dispense Refill Last Dose  . acetaminophen (TYLENOL) 500 MG tablet Take 500 mg by mouth daily as needed for pain.    Past Week at Unknown time  . ASPERCREME W/LIDOCAINE EX Apply 1 application topically daily as needed (FOR HIP/KNEE PAIN).    Past Week at Unknown time  . BLACK COHOSH PO Take 1 tablet by mouth daily.   03/04/2017 at Unknown time  . calcium carbonate (OS-CAL - DOSED IN MG OF ELEMENTAL CALCIUM) 1250 (500 Ca) MG tablet Take 1 tablet by mouth 2 (two) times daily.    03/04/2017 at Unknown time  . Carboxymethylcellulose Sodium (THERATEARS) 0.25 % SOLN Apply 1 drop to eye daily.   03/04/2017 at Unknown time  . cetirizine (ZYRTEC) 10 MG tablet Take 10 mg by mouth daily.   03/04/2017 at Unknown time  . clobetasol cream (TEMOVATE) 1.09 % Apply 1 application topically daily as needed (FOR RASH).    Past Week at Unknown time  . cycloSPORINE (RESTASIS) 0.05 % ophthalmic emulsion Place 1 drop into both eyes 2 (two) times daily.   03/04/2017 at Unknown time  . diazepam (VALIUM) 2 MG tablet Take 2 mg by mouth every 6 (six) hours as needed for anxiety.    unknown  . diclofenac (VOLTAREN) 75 MG EC tablet Take 75 mg by mouth 2 (two) times daily.   03/04/2017 at Unknown time  . famotidine (PEPCID) 20 MG tablet Take 20 mg by mouth every morning.    03/04/2017 at Unknown time  . fluticasone (FLONASE) 50 MCG/ACT nasal spray Place 2 sprays into the nose daily.     unknown  . gabapentin (NEURONTIN) 100 MG capsule Take 100 mg by mouth at bedtime.   03/03/2017 at Unknown time  . hydrochlorothiazide (HYDRODIURIL) 25 MG tablet Take 25 mg by mouth daily.     . Multiple Vitamin (MULTIVITAMIN) capsule Take 1 capsule by mouth every morning.    03/04/2017 at Unknown time  . Omega-3 Fatty Acids (OMEGA-3 FISH OIL PO) Take 1,000 mg by mouth every morning.    03/04/2017 at Unknown time  . Potassium 99 MG TABS Take 1 tablet by mouth daily.   03/04/2017 at Unknown time   Assessment: Okay for Protocol, heparin level above goal this AM.  No bleeding noted, CBC stable.  Infusing properly per RN report.  Goal of Therapy:  Heparin level 0.3-0.7 units/ml Monitor platelets by anticoagulation protocol: Yes  Plan:  Reduce Heparin drip to 1050 units/hr. Check anti-Xa level in 6 hours and daily while on heparin Continue to  monitor H&H and platelets  Pricilla Larsson 03/05/2017,7:34 AM

## 2017-03-06 LAB — BASIC METABOLIC PANEL
Anion gap: 6 (ref 5–15)
BUN: 17 mg/dL (ref 6–20)
CALCIUM: 9.3 mg/dL (ref 8.9–10.3)
CHLORIDE: 109 mmol/L (ref 101–111)
CO2: 27 mmol/L (ref 22–32)
CREATININE: 0.97 mg/dL (ref 0.44–1.00)
GFR calc Af Amer: >60 mL/min (ref >60)
GFR calc non Af Amer: 54 mL/min — ABNORMAL LOW (ref >60)
GLUCOSE: 88 mg/dL (ref 65–99)
Potassium: 3.9 mmol/L (ref 3.5–5.1)
Sodium: 142 mmol/L (ref 135–145)

## 2017-03-06 LAB — CBC WITH DIFFERENTIAL/PLATELET
Basophils Absolute: 0 10*3/uL (ref 0.0–0.1)
Basophils Relative: 0 %
Eosinophils Absolute: 0.2 10*3/uL (ref 0.0–0.7)
Eosinophils Relative: 3 %
HEMATOCRIT: 39.1 % (ref 36.0–46.0)
HEMOGLOBIN: 13.3 g/dL (ref 12.0–15.0)
LYMPHS ABS: 3 10*3/uL (ref 0.7–4.0)
LYMPHS PCT: 51 %
MCH: 30.5 pg (ref 26.0–34.0)
MCHC: 34 g/dL (ref 30.0–36.0)
MCV: 89.7 fL (ref 78.0–100.0)
MONO ABS: 0.6 10*3/uL (ref 0.1–1.0)
MONOS PCT: 10 %
NEUTROS ABS: 2.1 10*3/uL (ref 1.7–7.7)
NEUTROS PCT: 36 %
Platelets: 216 10*3/uL (ref 150–400)
RBC: 4.36 MIL/uL (ref 3.87–5.11)
RDW: 15.4 % (ref 11.5–15.5)
WBC: 5.8 10*3/uL (ref 4.0–10.5)

## 2017-03-06 LAB — MAGNESIUM: Magnesium: 1.9 mg/dL (ref 1.7–2.4)

## 2017-03-06 MED ORDER — APIXABAN 5 MG PO TABS: ORAL_TABLET | ORAL | 1 refills | Status: DC

## 2017-03-06 MED ORDER — METOPROLOL SUCCINATE ER 25 MG PO TB24: 12.5000 mg | ORAL_TABLET | Freq: Every day | ORAL | 0 refills | Status: DC

## 2017-03-06 NOTE — Discharge Summary (Signed)
Physician Discharge Summary  Angela House YTK:[id number] DOB: [birth year] DOA: 03/04/2017  PCP: Monico Blitz, MD  Admit date: 03/04/2017 Discharge date: 03/06/2017  Admitted From: home Disposition:  home  Recommendations for Outpatient Follow-up:  1. Follow up with PCP in 1-2 weeks 2. Please obtain BMP/CBC in one week 3. Consider hypercoagulable panel work up, once the patient has completed a few months of anticoagulation   Home Health: Equipment/Devices:  Discharge Condition: stable CODE STATUS:full code Diet recommendation: heart healthy  Brief/Interim Summary: 81 year old female with a history of hypertension restless leg syndrome, admitted to the hospital with 1 week history of dyspnea on exertion. She said have bilateral pulmonary emboli and was started on intravenous heparin. PE as I score indicated low risk. Echocardiogram did not show any evidence of right heart strain. With anticoagulation, shortness of breath has improved. She is able to ambulate without significant difficulties. She's been transitioned to oral apixaban for me to complete at least 6-12 months of therapy. It does not appear that this event was provoked. Would consider checking hypercoagulable panel in the next 2-3 months when she's been adequately anticoagulated and out of the acute phase. Her NSAIDs have been discontinued due to risk of bleeding. She is breathing comfortably on room air is able to ambulate. Patient is otherwise stable for discharge.  Discharge Diagnoses:  Principal Problem:   Bilateral pulmonary embolism (HCC) Active Problems:   GERD (gastroesophageal reflux disease)   Arthritis   Hypertension   Restless leg syndrome   Borderline prolonged QT interval    Discharge Instructions  Discharge Instructions    Diet - low sodium heart healthy    Complete by:  As directed    Increase activity slowly    Complete by:  As directed      Allergies as of 03/06/2017      Reactions   Ciprofloxacin  Hives   Meclizine Other (See Comments)   dizziness      Medication List    STOP taking these medications   BLACK COHOSH PO   diclofenac 75 MG EC tablet Commonly known as:  VOLTAREN   hydrochlorothiazide 25 MG tablet Commonly known as:  HYDRODIURIL     TAKE these medications   acetaminophen 500 MG tablet Commonly known as:  TYLENOL Take 500 mg by mouth daily as needed for pain.   apixaban 5 MG Tabs tablet Commonly known as:  ELIQUIS Take 10mg  po bid for 7 days then 5mg  po bid   ASPERCREME W/LIDOCAINE EX Apply 1 application topically daily as needed (FOR HIP/KNEE PAIN).   calcium carbonate 1250 (500 Ca) MG tablet Commonly known as:  OS-CAL - dosed in mg of elemental calcium Take 1 tablet by mouth 2 (two) times daily.   cetirizine 10 MG tablet Commonly known as:  ZYRTEC Take 10 mg by mouth daily.   clobetasol cream 0.05 % Commonly known as:  TEMOVATE Apply 1 application topically daily as needed (FOR RASH).   cycloSPORINE 0.05 % ophthalmic emulsion Commonly known as:  RESTASIS Place 1 drop into both eyes 2 (two) times daily.   diazepam 2 MG tablet Commonly known as:  VALIUM Take 2 mg by mouth every 6 (six) hours as needed for anxiety.   famotidine 20 MG tablet Commonly known as:  PEPCID Take 20 mg by mouth every morning.   fluticasone 50 MCG/ACT nasal spray Commonly known as:  FLONASE Place 2 sprays into the nose daily.   gabapentin 100 MG capsule Commonly known as:  NEURONTIN  Take 100 mg by mouth at bedtime.   metoprolol succinate 25 MG 24 hr tablet Commonly known as:  TOPROL-XL Take 0.5 tablets (12.5 mg total) by mouth daily.   multivitamin capsule Take 1 capsule by mouth every morning.   OMEGA-3 FISH OIL PO Take 1,000 mg by mouth every morning.   Potassium 99 MG Tabs Take 1 tablet by mouth daily.   THERATEARS 0.25 % Soln Generic drug:  Carboxymethylcellulose Sodium Apply 1 drop to eye daily.            Discharge Care Instructions         Start     Ordered   03/06/17 0000  metoprolol succinate (TOPROL-XL) 25 MG 24 hr tablet  Daily     03/06/17 1104   03/06/17 0000  apixaban (ELIQUIS) 5 MG TABS tablet     03/06/17 1104   03/06/17 0000  Increase activity slowly     03/06/17 1104   03/06/17 0000  Diet - low sodium heart healthy     03/06/17 1104      Allergies  Allergen Reactions  . Ciprofloxacin Hives  . Meclizine Other (See Comments)    dizziness    Consultations:     Procedures/Studies: Dg Chest 2 View  Result Date: 03/04/2017 CLINICAL DATA:  Shortness of breath since this morning EXAM: CHEST  2 VIEW COMPARISON:  October 27, 2014 FINDINGS: The heart size and mediastinal contours are within normal limits. There is no focal infiltrate, pulmonary edema, or pleural effusion. Degenerative joint changes of the spine are noted. IMPRESSION: No active cardiopulmonary disease. Electronically Signed   By: Abelardo Diesel M.D.   On: 03/04/2017 20:34   Ct Angio Chest Pe W And/or Wo Contrast  Result Date: 03/04/2017 CLINICAL DATA:  PE suspected, intermediate prob, positive D-dimer. Shortness of breath since this morning. EXAM: CT ANGIOGRAPHY CHEST WITH CONTRAST TECHNIQUE: Multidetector CT imaging of the chest was performed using the standard protocol during bolus administration of intravenous contrast. Multiplanar CT image reconstructions and MIPs were obtained to evaluate the vascular anatomy. CONTRAST:  100 cc Isovue 370 IV COMPARISON:  Chest radiograph earlier this day.  Chest CT 05/17/2013 FINDINGS: Cardiovascular: Positive for bilateral pulmonary emboli. Thromboembolic burden greater on the right with involvement of the segmental arteries in the right upper, middle, and lower lobes. Left lower lobe pulmonary emboli involves the segmental bifurcation of the lower lobe. Overall thromboembolic burden is small to moderate. RV to LV ratio is 0.95. Aortic tortuosity and mild atherosclerosis without aneurysm or dissection. Heart size  upper normal. Mediastinum/Nodes: No mediastinal or hilar adenopathy. The esophagus is patulous and air-filled. Small hiatal hernia. Calcified subcentimeter left thyroid nodule. No axillary adenopathy. Lungs/Pleura: Linear atelectasis in both lower lobes. No evidence of pulmonary infarct. No confluent consolidation. No pulmonary edema. No pleural fluid. No pneumothorax. Upper Abdomen: Small hiatal hernia.  No acute abnormality. Musculoskeletal: Scoliosis and mild degenerative change in the spine. There are no acute or suspicious osseous abnormalities. Review of the MIP images confirms the above findings. IMPRESSION: 1. Positive for bilateral acute pulmonary emboli. Thromboembolic burden is small to moderate, greater on the right involving the segmental arteries of the upper, middle and lower lobes. CT evidence of right heart strain (RV/LV Ratio = 0.95) consistent with at least submassive (intermediate risk) PE. The presence of right heart strain has been associated with an increased risk of morbidity and mortality. Please activate Code PE by paging [phone]. 2. Small hiatal hernia, incidentally noted. Critical Value/emergent results  were called by telephone at the time of interpretation on 03/04/2017 at 10:07 pm to Dr. Alvino Chapel, who verbally acknowledged these results. Aortic Atherosclerosis (ICD10-I70.0). Electronically Signed   By: Jeb Levering M.D.   On: 03/04/2017 22:07    Echo: - Left ventricle: The cavity size was normal. Wall thickness was   increased in a pattern of mild LVH. Systolic function was   vigorous. The estimated ejection fraction was in the range of 65%   to 70%. Wall motion was normal; there were no regional wall   motion abnormalities. Left ventricular diastolic function   parameters were normal. - Aortic valve: Valve area (VTI): 2.5 cm^2. Valve area (Vmax): 2.61   cm^2. - Technically adequate study.   Subjective: Shortness of breath is better. No chest pain  Discharge  Exam: Vitals:   03/06/17 0800 03/06/17 0900  BP: (!) 84/57 107/74  Pulse: 60 (!) 58  Resp: (!) 26 (!) 21  Temp:    SpO2: 98% 100%   Vitals:   03/06/17 0700 03/06/17 0733 03/06/17 0800 03/06/17 0900  BP:   (!) 84/57 107/74  Pulse: (!) 59 (!) 56 60 (!) 58  Resp: (!) 21 13 (!) 26 (!) 21  Temp:  97.9 F (36.6 C)    TempSrc:  Oral    SpO2: 100% 100% 98% 100%  Weight:      Height:        General: Pt is alert, awake, not in acute distress Cardiovascular: RRR, S1/S2 +, no rubs, no gallops Respiratory: CTA bilaterally, no wheezing, no rhonchi Abdominal: Soft, NT, ND, bowel sounds + Extremities: no edema, no cyanosis    The results of significant diagnostics from this hospitalization (including imaging, microbiology, ancillary and laboratory) are listed below for reference.     Microbiology: Recent Results (from the past 240 hour(s))  MRSA PCR Screening     Status: None   Collection Time: 03/04/17 11:56 PM  Result Value Ref Range Status   MRSA by PCR NEGATIVE NEGATIVE Final    Comment:        The GeneXpert MRSA Assay (FDA approved for NASAL specimens only), is one component of a comprehensive MRSA colonization surveillance program. It is not intended to diagnose MRSA infection nor to guide or monitor treatment for MRSA infections.      Labs: BNP (last 3 results)  Recent Labs  03/04/17 2001  BNP 78.2   Basic Metabolic Panel:  Recent Labs Lab 03/04/17 2001 03/05/17 0537 03/06/17 0542  NA 141 140 142  K 4.7 3.6 3.9  CL 109 108 109  CO2 26 26 27   GLUCOSE 89 89 88  BUN 21* 16 17  CREATININE 1.03* 0.87 0.97  CALCIUM 9.3 9.0 9.3  MG 2.1  --  1.9   Liver Function Tests:  Recent Labs Lab 03/04/17 2001  AST 26  ALT 24  ALKPHOS 42  BILITOT 0.4  PROT 7.6  ALBUMIN 4.0   No results for input(s): LIPASE, AMYLASE in the last 168 hours. No results for input(s): AMMONIA in the last 168 hours. CBC:  Recent Labs Lab 03/04/17 2001 03/05/17 0537  03/06/17 0542  WBC 5.9 6.8 5.8  NEUTROABS 2.2 2.5 2.1  HGB 13.4 12.5 13.3  HCT 39.8 36.9 39.1  MCV 89.8 89.3 89.7  PLT 226 208 216   Cardiac Enzymes:  Recent Labs Lab 03/04/17 2001 03/05/17 0537  TROPONINI <0.03 <0.03   BNP: Invalid input(s): POCBNP CBG: No results for input(s): GLUCAP in the last 168  hours. D-Dimer  Recent Labs  03/04/17 2001  DDIMER 3.64*   Hgb A1c No results for input(s): HGBA1C in the last 72 hours. Lipid Profile No results for input(s): CHOL, HDL, LDLCALC, TRIG, CHOLHDL, LDLDIRECT in the last 72 hours. Thyroid function studies No results for input(s): TSH, T4TOTAL, T3FREE, THYROIDAB in the last 72 hours.  Invalid input(s): FREET3 Anemia work up No results for input(s): VITAMINB12, FOLATE, FERRITIN, TIBC, IRON, RETICCTPCT in the last 72 hours. Urinalysis No results found for: COLORURINE, APPEARANCEUR, [place], [place], [place], [place], [place], [place], PROTEINUR, UROBILINOGEN, NITRITE, LEUKOCYTESUR Sepsis Labs Invalid input(s): PROCALCITONIN,  WBC,  LACTICIDVEN Microbiology Recent Results (from the past 240 hour(s))  MRSA PCR Screening     Status: None   Collection Time: 03/04/17 11:56 PM  Result Value Ref Range Status   MRSA by PCR NEGATIVE NEGATIVE Final    Comment:        The GeneXpert MRSA Assay (FDA approved for NASAL specimens only), is one component of a comprehensive MRSA colonization surveillance program. It is not intended to diagnose MRSA infection nor to guide or monitor treatment for MRSA infections.      Time coordinating discharge: Over 30 minutes  SIGNED:   Kathie Dike, MD  [facility] 03/06/2017, 11:05 AM Pager   If 7PM-7AM, please contact night-coverage [email or web address] Password [email or web address]

## 2017-03-06 NOTE — Care Management Note (Addendum)
Case Management Note  Patient Details  Name: Angela House MRN: [id number] Date of Birth: Feb 27, 1936     Expected Discharge Date:  03/06/17               Expected Discharge Plan:  Home/Self Care  In-House Referral:     Discharge planning Services  CM Consult, Medication Assistance  Post Acute Care Choice:    Choice offered to:  NA  DME Arranged:    DME Agency:     HH Arranged:    [place] Agency:     Status of Service:  Completed, signed off  If discussed at [employer] of Stay Meetings, dates discussed:    Additional Comments: Patient discharging home today. Started on Eliquis. Voucher for 30 day free given. Benefits check on Eliquis in progress.   ADDENDUM: Eliquis will be $3 per benefits check.  Floye Fesler, Chauncey Reading, RN 03/06/2017, 11:55 AM

## 2017-03-06 NOTE — Plan of Care (Signed)
Problem: Activity: Goal: Risk for activity intolerance will decrease Outcome: Progressing Patient up and moving around in room with no issues, no SOB.

## 2017-03-06 NOTE — Care Management Important Message (Signed)
Important Message  Patient Details  Name: Angela House MRN: [id number] Date of Birth: February 11, 1936   Medicare Important Message Given:  Yes    Keera Altidor, Chauncey Reading, RN 03/06/2017, 11:54 AM

## 2017-03-06 NOTE — Progress Notes (Signed)
ANTICOAGULATION CONSULT NOTE - Follow Up Consult  Pharmacy Consult for Heparin-->Eliquis Indication: pulmonary embolus  Allergies  Allergen Reactions  . Ciprofloxacin Hives  . Meclizine Other (See Comments)    dizziness    Patient Measurements: Height: 5\' 5"  (165.1 cm) Weight: 190 lb 14.7 oz (86.6 kg) IBW/kg (Calculated) : 57 HEPARIN DW (KG): 76   Vital Signs: Temp: 97.9 F (36.6 C) (09/05 0733) Temp Source: Oral (09/05 0733) Pulse Rate: 56 (09/05 0733) Labs:  Recent Labs  03/04/17 2001 03/05/17 0537 03/05/17 1337 03/06/17 0542  HGB 13.4 12.5  --  13.3  HCT 39.8 36.9  --  39.1  PLT 226 208  --  216  APTT 33  --   --   --   LABPROT 12.6  --   --   --   INR 0.95  --   --   --   HEPARINUNFRC  --  1.01* 1.02*  --   CREATININE 1.03* 0.87  --  0.97  TROPONINI <0.03 <0.03  --   --     Estimated Creatinine Clearance: 51.1 mL/min (by C-G formula based on SCr of 0.97 mg/dL).   Medications:  Prescriptions Prior to Admission  Medication Sig Dispense Refill Last Dose  . acetaminophen (TYLENOL) 500 MG tablet Take 500 mg by mouth daily as needed for pain.    Past Week at Unknown time  . ASPERCREME W/LIDOCAINE EX Apply 1 application topically daily as needed (FOR HIP/KNEE PAIN).    Past Week at Unknown time  . BLACK COHOSH PO Take 1 tablet by mouth daily.   03/04/2017 at Unknown time  . calcium carbonate (OS-CAL - DOSED IN MG OF ELEMENTAL CALCIUM) 1250 (500 Ca) MG tablet Take 1 tablet by mouth 2 (two) times daily.    03/04/2017 at Unknown time  . Carboxymethylcellulose Sodium (THERATEARS) 0.25 % SOLN Apply 1 drop to eye daily.   03/04/2017 at Unknown time  . cetirizine (ZYRTEC) 10 MG tablet Take 10 mg by mouth daily.   03/04/2017 at Unknown time  . clobetasol cream (TEMOVATE) 7.51 % Apply 1 application topically daily as needed (FOR RASH).    Past Week at Unknown time  . cycloSPORINE (RESTASIS) 0.05 % ophthalmic emulsion Place 1 drop into both eyes 2 (two) times daily.   03/04/2017 at  Unknown time  . diazepam (VALIUM) 2 MG tablet Take 2 mg by mouth every 6 (six) hours as needed for anxiety.   unknown  . diclofenac (VOLTAREN) 75 MG EC tablet Take 75 mg by mouth 2 (two) times daily.   03/04/2017 at Unknown time  . famotidine (PEPCID) 20 MG tablet Take 20 mg by mouth every morning.    03/04/2017 at Unknown time  . fluticasone (FLONASE) 50 MCG/ACT nasal spray Place 2 sprays into the nose daily.     unknown  . gabapentin (NEURONTIN) 100 MG capsule Take 100 mg by mouth at bedtime.   03/03/2017 at Unknown time  . hydrochlorothiazide (HYDRODIURIL) 25 MG tablet Take 25 mg by mouth daily.     . Multiple Vitamin (MULTIVITAMIN) capsule Take 1 capsule by mouth every morning.    03/04/2017 at Unknown time  . Omega-3 Fatty Acids (OMEGA-3 FISH OIL PO) Take 1,000 mg by mouth every morning.    03/04/2017 at Unknown time  . Potassium 99 MG TABS Take 1 tablet by mouth daily.   03/04/2017 at Unknown time   Assessment:  81 yo female with unprovoked Bilateral PE. Started on heparin initially, now transitioning  to eliquis.  Goal of Therapy:  Heparin level 0.3-0.7 units/ml Monitor platelets by anticoagulation protocol: Yes  Plan:  Eliquis 10mg  po BID for 7 days, then 5mg  po BID Monitor for S/S of bleeding Educate on eliquis  Isac Sarna, BS Vena [place], BCPS Clinical Pharmacist Pager [phone] 03/06/2017,8:13 AM

## 2017-03-08 DIAGNOSIS — Z6831 Body mass index (BMI) 31.0-31.9, adult: Secondary | ICD-10-CM | POA: Diagnosis not present

## 2017-03-08 DIAGNOSIS — I2699 Other pulmonary embolism without acute cor pulmonale: Secondary | ICD-10-CM | POA: Diagnosis not present

## 2017-03-08 DIAGNOSIS — R5383 Other fatigue: Secondary | ICD-10-CM | POA: Diagnosis not present

## 2017-03-08 DIAGNOSIS — Z299 Encounter for prophylactic measures, unspecified: Secondary | ICD-10-CM | POA: Diagnosis not present

## 2017-03-08 DIAGNOSIS — Z713 Dietary counseling and surveillance: Secondary | ICD-10-CM | POA: Diagnosis not present

## 2017-03-11 DIAGNOSIS — I2699 Other pulmonary embolism without acute cor pulmonale: Secondary | ICD-10-CM | POA: Diagnosis not present

## 2017-03-12 ENCOUNTER — Emergency Department (HOSPITAL_COMMUNITY): Payer: Medicare Other

## 2017-03-12 ENCOUNTER — Encounter (HOSPITAL_COMMUNITY): Payer: Self-pay | Admitting: Emergency Medicine

## 2017-03-12 ENCOUNTER — Emergency Department (HOSPITAL_COMMUNITY)
Admission: EM | Admit: 2017-03-12 | Discharge: 2017-03-12 | Disposition: A | Discharge status: Home / Self Care | Payer: Medicare Other | Attending: Emergency Medicine | Admitting: Emergency Medicine

## 2017-03-12 DIAGNOSIS — Z79899 Other long term (current) drug therapy: Secondary | ICD-10-CM | POA: Diagnosis not present

## 2017-03-12 DIAGNOSIS — R079 Chest pain, unspecified: Secondary | ICD-10-CM | POA: Insufficient documentation

## 2017-03-12 DIAGNOSIS — I10 Essential (primary) hypertension: Secondary | ICD-10-CM | POA: Diagnosis not present

## 2017-03-12 DIAGNOSIS — Z7901 Long term (current) use of anticoagulants: Secondary | ICD-10-CM | POA: Diagnosis not present

## 2017-03-12 HISTORY — DX: Other pulmonary embolism without acute cor pulmonale: I26.99

## 2017-03-12 LAB — BASIC METABOLIC PANEL
Anion gap: 7 (ref 5–15)
BUN: 15 mg/dL (ref 6–20)
CALCIUM: 9.6 mg/dL (ref 8.9–10.3)
CO2: 27 mmol/L (ref 22–32)
CREATININE: 0.87 mg/dL (ref 0.44–1.00)
Chloride: 103 mmol/L (ref 101–111)
GFR calc non Af Amer: >60 mL/min (ref >60)
Glucose, Bld: 89 mg/dL (ref 65–99)
Potassium: 4.4 mmol/L (ref 3.5–5.1)
SODIUM: 137 mmol/L (ref 135–145)

## 2017-03-12 LAB — CBC
HCT: 41.7 % (ref 36.0–46.0)
Hemoglobin: 14.2 g/dL (ref 12.0–15.0)
MCH: 30.8 pg (ref 26.0–34.0)
MCHC: 34.1 g/dL (ref 30.0–36.0)
MCV: 90.5 fL (ref 78.0–100.0)
PLATELETS: 246 10*3/uL (ref 150–400)
RBC: 4.61 MIL/uL (ref 3.87–5.11)
RDW: 15.2 % (ref 11.5–15.5)
WBC: 5.4 10*3/uL (ref 4.0–10.5)

## 2017-03-12 LAB — TROPONIN I

## 2017-03-12 NOTE — ED Notes (Signed)
Pt back from x-ray.

## 2017-03-12 NOTE — ED Triage Notes (Signed)
Patient c/o left side chest pain that started yesterday. Patient seen here in ED on 9/5 for shortness of breath and diagnosed with bilateral PE Patient takes Eliquis 5mg . Patient reports improvement in shortness of breath. Per patient pain radiates in back and she has generalized weakness.

## 2017-03-12 NOTE — ED Notes (Signed)
Pt states no pain now, just a slight bit of pressure

## 2017-03-12 NOTE — Discharge Instructions (Signed)
Tylenol for pain. Continue Xarelto as prescribed.

## 2017-03-14 ENCOUNTER — Ambulatory Visit: Payer: Medicare Other | Admitting: Nurse Practitioner

## 2017-03-17 NOTE — ED Provider Notes (Signed)
[facility] DEPT Provider Note   CSN: [id number] Arrival date & time: 03/12/17  1000     History   Chief Complaint Chief Complaint  Patient presents with  . Chest Pain    HPI Angela House is a 81 y.o. female. CC: chest pain  HPI:  Patient recently diagnosed with PE. She is anticoagulated. States she will occasionally have some right-sided chest pain. Somewhat positional. Somewhat pleuritic. States her exertional capacity is not the same. When she exerts she still feels a bit short of breath. States she wasn't sure she was supposed to be having chest pain still. It is intermittent and brief. No cough. No fever. No hemoptysis.  Past Medical History:  Diagnosis Date  . Arthritis   . Hypertension   . PE (pulmonary thromboembolism) ([place])   . Restless leg syndrome     Patient Active Problem List   Diagnosis Date Noted  . Restless leg syndrome 03/05/2017  . Borderline prolonged QT interval 03/05/2017  . Bilateral pulmonary embolism ([place]) 03/04/2017  . Arthritis 03/04/2017  . Hypertension 03/04/2017  . GERD (gastroesophageal reflux disease) 11/17/2015    Past Surgical History:  Procedure Laterality Date  . ABDOMINAL HYSTERECTOMY    . CHOLECYSTECTOMY    . EYE SURGERY    . KNEE SURGERY    . WRIST SURGERY      OB History    No data available       Home Medications    Prior to Admission medications   Medication Sig Start Date End Date Taking? Authorizing Provider  acetaminophen (TYLENOL) 500 MG tablet Take 500 mg by mouth daily as needed for pain.    Yes [provider]  apixaban (ELIQUIS) 5 MG TABS tablet Take 10mg  po bid for 7 days then 5mg  po bid Patient taking differently: Take 5 mg by mouth daily.  03/06/17  Yes Kathie Dike, MD  ASPERCREME W/LIDOCAINE EX Apply 1 application topically daily as needed (FOR HIP/KNEE PAIN).    Yes [provider]  calcium carbonate (OS-CAL - DOSED IN MG OF ELEMENTAL CALCIUM) 1250 (500 Ca) MG tablet Take 1  tablet by mouth 2 (two) times daily.    Yes [provider]  Carboxymethylcellulose Sodium (THERATEARS) 0.25 % SOLN Apply 1 drop to eye daily.   Yes [provider]  cetirizine (ZYRTEC) 10 MG tablet Take 10 mg by mouth daily.   Yes [provider]  clobetasol cream (TEMOVATE) 9.51 % Apply 1 application topically daily as needed (FOR RASH).  12/13/15  Yes [provider]  cycloSPORINE (RESTASIS) 0.05 % ophthalmic emulsion Place 1 drop into both eyes 2 (two) times daily.   Yes [provider]  famotidine (PEPCID) 20 MG tablet Take 20 mg by mouth every morning.    Yes [provider]  fluticasone (FLONASE) 50 MCG/ACT nasal spray Place 2 sprays into the nose daily.     Yes [provider]  gabapentin (NEURONTIN) 100 MG capsule Take 100 mg by mouth at bedtime.   Yes [provider]  Multiple Vitamin (MULTIVITAMIN) capsule Take 1 capsule by mouth every morning.    Yes [provider]  Omega-3 Fatty Acids (OMEGA-3 FISH OIL PO) Take 1,000 mg by mouth every morning.    Yes [provider]  Potassium 99 MG TABS Take 1 tablet by mouth daily.   Yes [provider]  metoprolol succinate (TOPROL-XL) 25 MG 24 hr tablet Take 0.5 tablets (12.5 mg total) by mouth daily. Patient not taking:  Reported on 03/12/2017 03/06/17   Kathie Dike, MD    Family History Family History  Problem Relation Age of Onset  . Arthritis Mother   . Hypertension Mother   . Hypertension Sister   . Pneumonia Brother   . Hypertension Sister   . Kidney disease Sister   . Colon cancer Neg Hx     Social History Social History  Substance Use Topics  . Smoking status: Never Smoker  . Smokeless tobacco: Never Used  . Alcohol use No     Allergies   Ciprofloxacin and Meclizine   Review of Systems Review of Systems  Constitutional: Negative for appetite change, chills, diaphoresis, fatigue and fever.  HENT: Negative for mouth  sores, sore throat and trouble swallowing.   Eyes: Negative for visual disturbance.  Respiratory: Negative for cough, chest tightness, shortness of breath and wheezing.   Cardiovascular: Positive for chest pain.  Gastrointestinal: Negative for abdominal distention, abdominal pain, diarrhea, nausea and vomiting.  Endocrine: Negative for polydipsia, polyphagia and polyuria.  Genitourinary: Negative for dysuria, frequency and hematuria.  Musculoskeletal: Negative for gait problem.  Skin: Negative for color change, pallor and rash.  Neurological: Negative for dizziness, syncope, light-headedness and headaches.  Hematological: Does not bruise/bleed easily.  Psychiatric/Behavioral: Negative for behavioral problems and confusion.     Physical Exam Updated Vital Signs BP 121/69   Pulse (!) 58   Temp 98.1 F (36.7 C) (Oral)   Resp 11   Ht 5\' 5"  (1.651 m)   Wt 86.2 kg (190 lb)   SpO2 98%   BMI 31.62 kg/m   Physical Exam  Constitutional: She is oriented to person, place, and time. She appears well-developed and well-nourished. No distress.  HENT:  Head: Normocephalic.  Eyes: Pupils are equal, round, and reactive to light. Conjunctivae are normal. No scleral icterus.  Neck: Normal range of motion. Neck supple. No thyromegaly present.  Cardiovascular: Normal rate and regular rhythm.  Exam reveals no gallop and no friction rub.   No murmur heard. Pulmonary/Chest: Effort normal and breath sounds normal. No respiratory distress. She has no wheezes. She has no rales.  Clear bilateral breath sounds. Well oxygenated. Not tachycardic.  Abdominal: Soft. Bowel sounds are normal. She exhibits no distension. There is no tenderness. There is no rebound.  Musculoskeletal: Normal range of motion.  Neurological: She is alert and oriented to person, place, and time.  Skin: Skin is warm and dry. No rash noted.  Psychiatric: She has a normal mood and affect. Her behavior is normal.     ED Treatments /  Results  Labs (all labs ordered are listed, but only abnormal results are displayed) Labs Reviewed  BASIC METABOLIC PANEL  CBC  TROPONIN I    EKG  EKG Interpretation  Date/Time:  Tuesday March 12 2017 10:46:27 EDT Ventricular Rate:  65 PR Interval:    QRS Duration: 95 QT Interval:  413 QTC Calculation: 430 R Axis:   -17 Text Interpretation:  Sinus rhythm Borderline left axis deviation Nonspecific T abnormalities, diffuse leads Confirmed by Tanna Furry [phone]) on 03/12/2017 10:59:00 AM       Radiology No results found.  Procedures Procedures (including critical care time)  Medications Ordered in ED Medications - No data to display   Initial Impression / Assessment and Plan / ED Course  I have reviewed the triage vital signs and the nursing notes.  Pertinent labs & imaging results that were available during my care of the patient were reviewed by me and  considered in my medical decision making (see chart for details).    Reassuring evaluation here. Clinically, radiographically no signs of right heart strain or failure. No edema. Not hypoxemic or tachycardic. Appropriate for discharge home.  Final Clinical Impressions(s) / ED Diagnoses   Final diagnoses:  Chest pain, unspecified type    New Prescriptions Discharge Medication List as of 03/12/2017 12:51 PM       Tanna Furry, MD 03/17/17 1512

## 2017-03-19 DIAGNOSIS — I2699 Other pulmonary embolism without acute cor pulmonale: Secondary | ICD-10-CM | POA: Diagnosis not present

## 2017-03-19 DIAGNOSIS — K219 Gastro-esophageal reflux disease without esophagitis: Secondary | ICD-10-CM | POA: Diagnosis not present

## 2017-03-19 DIAGNOSIS — Z299 Encounter for prophylactic measures, unspecified: Secondary | ICD-10-CM | POA: Diagnosis not present

## 2017-03-19 DIAGNOSIS — Z6832 Body mass index (BMI) 32.0-32.9, adult: Secondary | ICD-10-CM | POA: Diagnosis not present

## 2017-03-19 DIAGNOSIS — G629 Polyneuropathy, unspecified: Secondary | ICD-10-CM | POA: Diagnosis not present

## 2017-03-19 DIAGNOSIS — N183 Chronic kidney disease, stage 3 (moderate): Secondary | ICD-10-CM | POA: Diagnosis not present

## 2017-03-22 DIAGNOSIS — Z85828 Personal history of other malignant neoplasm of skin: Secondary | ICD-10-CM | POA: Diagnosis not present

## 2017-03-22 DIAGNOSIS — Z7901 Long term (current) use of anticoagulants: Secondary | ICD-10-CM | POA: Diagnosis not present

## 2017-03-22 DIAGNOSIS — K219 Gastro-esophageal reflux disease without esophagitis: Secondary | ICD-10-CM | POA: Diagnosis not present

## 2017-03-22 DIAGNOSIS — Z7982 Long term (current) use of aspirin: Secondary | ICD-10-CM | POA: Diagnosis not present

## 2017-03-22 DIAGNOSIS — Z79899 Other long term (current) drug therapy: Secondary | ICD-10-CM | POA: Diagnosis not present

## 2017-03-22 DIAGNOSIS — R252 Cramp and spasm: Secondary | ICD-10-CM | POA: Diagnosis not present

## 2017-03-22 DIAGNOSIS — Z86711 Personal history of pulmonary embolism: Secondary | ICD-10-CM | POA: Diagnosis not present

## 2017-03-22 DIAGNOSIS — I10 Essential (primary) hypertension: Secondary | ICD-10-CM | POA: Diagnosis not present

## 2017-03-23 DIAGNOSIS — R05 Cough: Secondary | ICD-10-CM | POA: Diagnosis not present

## 2017-03-23 DIAGNOSIS — R079 Chest pain, unspecified: Secondary | ICD-10-CM | POA: Diagnosis not present

## 2017-03-23 DIAGNOSIS — E039 Hypothyroidism, unspecified: Secondary | ICD-10-CM | POA: Diagnosis not present

## 2017-03-23 DIAGNOSIS — I10 Essential (primary) hypertension: Secondary | ICD-10-CM | POA: Diagnosis not present

## 2017-03-23 DIAGNOSIS — Z7901 Long term (current) use of anticoagulants: Secondary | ICD-10-CM | POA: Diagnosis not present

## 2017-03-23 DIAGNOSIS — M1990 Unspecified osteoarthritis, unspecified site: Secondary | ICD-10-CM | POA: Diagnosis not present

## 2017-03-23 DIAGNOSIS — Z8249 Family history of ischemic heart disease and other diseases of the circulatory system: Secondary | ICD-10-CM | POA: Diagnosis not present

## 2017-03-23 DIAGNOSIS — J45909 Unspecified asthma, uncomplicated: Secondary | ICD-10-CM | POA: Diagnosis not present

## 2017-03-23 DIAGNOSIS — Z79899 Other long term (current) drug therapy: Secondary | ICD-10-CM | POA: Diagnosis not present

## 2017-03-23 DIAGNOSIS — J209 Acute bronchitis, unspecified: Secondary | ICD-10-CM | POA: Diagnosis not present

## 2017-03-23 DIAGNOSIS — R042 Hemoptysis: Secondary | ICD-10-CM | POA: Diagnosis not present

## 2017-03-26 ENCOUNTER — Ambulatory Visit (INDEPENDENT_AMBULATORY_CARE_PROVIDER_SITE_OTHER): Payer: Medicare Other | Admitting: Gastroenterology

## 2017-03-26 ENCOUNTER — Encounter: Payer: Self-pay | Admitting: Gastroenterology

## 2017-03-26 VITALS — BP 126/70 | HR 71 | Temp 97.3°F | Ht 65.0 in | Wt 192.8 lb

## 2017-03-26 DIAGNOSIS — I2699 Other pulmonary embolism without acute cor pulmonale: Secondary | ICD-10-CM

## 2017-03-26 DIAGNOSIS — R1012 Left upper quadrant pain: Secondary | ICD-10-CM | POA: Insufficient documentation

## 2017-03-26 DIAGNOSIS — K219 Gastro-esophageal reflux disease without esophagitis: Secondary | ICD-10-CM | POA: Diagnosis not present

## 2017-03-26 NOTE — Progress Notes (Signed)
cc'ed to pcp °

## 2017-03-26 NOTE — Assessment & Plan Note (Signed)
Several weeks of LUQ pain. Previously on NSAIDs. Now on Eliquis for bilateral PEs. PEs possibly related to long car ride. Given new onset abdominal pain and h/o PEs, will pursue CT A/P with contrast. Need to exclude malignancy. If CT unremarkable, we will transition her over to PPI to see if helps with pain. Advised to monitor for any evidence of GI bleeding. Warning signs discussed. Further recommendations to follow.

## 2017-03-26 NOTE — Progress Notes (Signed)
Primary Care Physician: Monico Blitz, MD  Primary Gastroenterologist:  Garfield Cornea, MD   Chief Complaint  Patient presents with  . Gastroesophageal Reflux    HPI: Angela House is a 81 y.o. female here for f/u GERD and for new LUQ pain. States she was in hospital earlier this month with bilateral PEs. Possibly related to a road trip she took couple of weeks prior. Patient reports PCP did not find any blood clots in her legs by u/s. She has had a lot of problems with leg discomfort at night. Had to go back to ED after discharge for ?hemoptysis. Started on zithromax for bronchitis. Her Diclofenac was D/C due to risk of bleeding on Eliquis.   She was seen by [place] in 2017 for GERD that was responsive to PPI. She came off PPI sometime in late 2017. Has been using Pepcid in AMs with good results and only occasional heartburn. No dysphagia. No vomiting.   Complains of left upper abd pain for several weeks. Hurts if she lays on left side. Hurts first thing in the mornings. Doesn't notice any relation to food. BM normal. No melena, brbpr. No weight loss.   Current Outpatient Prescriptions  Medication Sig Dispense Refill  . acetaminophen (TYLENOL) 500 MG tablet Take 500 mg by mouth daily as needed for pain.     Marland Kitchen apixaban (ELIQUIS) 5 MG TABS tablet Take 10mg  po bid for 7 days then 5mg  po bid (Patient taking differently: Take 5 mg by mouth daily. ) 60 tablet 1  . ASPERCREME W/LIDOCAINE EX Apply 1 application topically daily as needed (FOR HIP/KNEE PAIN).     Marland Kitchen azithromycin (ZITHROMAX) 250 MG tablet Take 250 mg by mouth daily.    . calcium carbonate (OS-CAL - DOSED IN MG OF ELEMENTAL CALCIUM) 1250 (500 Ca) MG tablet Take 1 tablet by mouth 2 (two) times daily.     . Carboxymethylcellulose Sodium (THERATEARS) 0.25 % SOLN Apply 1 drop to eye daily.    . cetirizine (ZYRTEC) 10 MG tablet Take 10 mg by mouth daily.    . clobetasol cream (TEMOVATE) 2.13 % Apply 1 application topically daily as  needed (FOR RASH).     Marland Kitchen cycloSPORINE (RESTASIS) 0.05 % ophthalmic emulsion Place 1 drop into both eyes 2 (two) times daily.    . diazepam (VALIUM) 2 MG tablet Take 2 mg by mouth every 6 (six) hours as needed for anxiety.    . famotidine (PEPCID) 20 MG tablet Take 20 mg by mouth every morning.     . fluticasone (FLONASE) 50 MCG/ACT nasal spray Place 2 sprays into the nose daily.      Marland Kitchen gabapentin (NEURONTIN) 100 MG capsule Take 100 mg by mouth 2 (two) times daily.     . Multiple Vitamin (MULTIVITAMIN) capsule Take 1 capsule by mouth every morning.     . Omega-3 Fatty Acids (OMEGA-3 FISH OIL PO) Take 1,000 mg by mouth every morning.     . Potassium 99 MG TABS Take 1 tablet by mouth daily.    . metoprolol succinate (TOPROL-XL) 25 MG 24 hr tablet Take 0.5 tablets (12.5 mg total) by mouth daily. (Patient not taking: Reported on 03/12/2017) 30 tablet 0  . VENTOLIN HFA 108 (90 Base) MCG/ACT inhaler Inhale 1-2 puffs into the lungs as needed.     No current facility-administered medications for this visit.     Allergies as of 03/26/2017 - Review Complete 03/26/2017  Allergen Reaction Noted  . Ciprofloxacin  Hives 09/21/2015  . Meclizine Other (See Comments) 09/21/2015    ROS:  General: Negative for anorexia, weight loss, fever, chills, fatigue, weakness. ENT: Negative for hoarseness, difficulty swallowing , nasal congestion. CV: Negative for chest pain, angina, palpitations, dyspnea on exertion, peripheral edema.  Respiratory: Negative for dyspnea at rest, dyspnea on exertion, cough, sputum, wheezing.  GI: See history of present illness. GU:  Negative for dysuria, hematuria, urinary incontinence, urinary frequency, nocturnal urination.  Endo: Negative for unusual weight change.    Physical Examination:   BP 126/70   Pulse 71   Temp (!) 97.3 F (36.3 C) (Oral)   Ht 5\' 5"  (1.651 m)   Wt 192 lb 12.8 oz (87.5 kg)   BMI 32.08 kg/m   General: Well-nourished, well-developed in no acute  distress.  Eyes: No icterus. Mouth: Oropharyngeal mucosa moist and pink , no lesions erythema or exudate. Lungs: Clear to auscultation bilaterally.  Heart: Regular rate and rhythm, no murmurs rubs or gallops.  Abdomen: Bowel sounds are normal,   nondistended, no hepatosplenomegaly or masses, no abdominal bruits or hernia , no rebound or guarding.  Pain in left upper abd with deep palpation and into epigastrium some. Pain below rib cage margin.  Extremities: No lower extremity edema. No clubbing or deformities. Neuro: Alert and oriented x 4   Skin: Warm and dry, no jaundice.   Psych: Alert and cooperative, normal mood and affect.  Labs:  Lab Results  Component Value Date   CREATININE 0.87 03/12/2017   BUN 15 03/12/2017   NA 137 03/12/2017   K 4.4 03/12/2017   CL 103 03/12/2017   CO2 27 03/12/2017   Lab Results  Component Value Date   ALT 24 03/04/2017   AST 26 03/04/2017   ALKPHOS 42 03/04/2017   BILITOT 0.4 03/04/2017   Lab Results  Component Value Date   WBC 5.4 03/12/2017   HGB 14.2 03/12/2017   HCT 41.7 03/12/2017   MCV 90.5 03/12/2017   PLT 246 03/12/2017   No results found for: LIPASE  Imaging Studies: Dg Chest 2 View  Result Date: 03/12/2017 CLINICAL DATA:  LEFT chest pain since yesterday, history hypertension, pulmonary embolism EXAM: CHEST  2 VIEW COMPARISON:  03/04/2017 FINDINGS: Normal heart size, mediastinal contours, and pulmonary vascularity. Tortuous thoracic aorta with minimal atherosclerotic calcification at arch. Minimal linear subsegmental atelectasis at LEFT base again seen. Remaining lungs clear. No acute infiltrate, pleural effusion or pneumothorax. Thoracolumbar scoliosis without acute bony abnormalities. IMPRESSION: Minimal persistent subsegmental atelectasis at LEFT base. Electronically Signed   By: Lavonia Dana M.D.   On: 03/12/2017 11:53   Dg Chest 2 View  Result Date: 03/04/2017 CLINICAL DATA:  Shortness of breath since this morning EXAM: CHEST  2  VIEW COMPARISON:  October 27, 2014 FINDINGS: The heart size and mediastinal contours are within normal limits. There is no focal infiltrate, pulmonary edema, or pleural effusion. Degenerative joint changes of the spine are noted. IMPRESSION: No active cardiopulmonary disease. Electronically Signed   By: Abelardo Diesel M.D.   On: 03/04/2017 20:34   Ct Angio Chest Pe W And/or Wo Contrast  Result Date: 03/04/2017 CLINICAL DATA:  PE suspected, intermediate prob, positive D-dimer. Shortness of breath since this morning. EXAM: CT ANGIOGRAPHY CHEST WITH CONTRAST TECHNIQUE: Multidetector CT imaging of the chest was performed using the standard protocol during bolus administration of intravenous contrast. Multiplanar CT image reconstructions and MIPs were obtained to evaluate the vascular anatomy. CONTRAST:  100 cc Isovue 370 IV COMPARISON:  Chest radiograph earlier this day.  Chest CT 05/17/2013 FINDINGS: Cardiovascular: Positive for bilateral pulmonary emboli. Thromboembolic burden greater on the right with involvement of the segmental arteries in the right upper, middle, and lower lobes. Left lower lobe pulmonary emboli involves the segmental bifurcation of the lower lobe. Overall thromboembolic burden is small to moderate. RV to LV ratio is 0.95. Aortic tortuosity and mild atherosclerosis without aneurysm or dissection. Heart size upper normal. Mediastinum/Nodes: No mediastinal or hilar adenopathy. The esophagus is patulous and air-filled. Small hiatal hernia. Calcified subcentimeter left thyroid nodule. No axillary adenopathy. Lungs/Pleura: Linear atelectasis in both lower lobes. No evidence of pulmonary infarct. No confluent consolidation. No pulmonary edema. No pleural fluid. No pneumothorax. Upper Abdomen: Small hiatal hernia.  No acute abnormality. Musculoskeletal: Scoliosis and mild degenerative change in the spine. There are no acute or suspicious osseous abnormalities. Review of the MIP images confirms the above  findings. IMPRESSION: 1. Positive for bilateral acute pulmonary emboli. Thromboembolic burden is small to moderate, greater on the right involving the segmental arteries of the upper, middle and lower lobes. CT evidence of right heart strain (RV/LV Ratio = 0.95) consistent with at least submassive (intermediate risk) PE. The presence of right heart strain has been associated with an increased risk of morbidity and mortality. Please activate Code PE by paging [phone]. 2. Small hiatal hernia, incidentally noted. Critical Value/emergent results were called by telephone at the time of interpretation on 03/04/2017 at 10:07 pm to Dr. Alvino Chapel, who verbally acknowledged these results. Aortic Atherosclerosis (ICD10-I70.0). Electronically Signed   By: Jeb Levering M.D.   On: 03/04/2017 22:07

## 2017-03-26 NOTE — Patient Instructions (Addendum)
1. Please have your CT scan done. 2. We will contact you with results as available.

## 2017-03-26 NOTE — Assessment & Plan Note (Signed)
Currently doing well on Pepcid.

## 2017-04-11 ENCOUNTER — Ambulatory Visit (HOSPITAL_COMMUNITY)
Admission: RE | Admit: 2017-04-11 | Discharge: 2017-04-11 | Disposition: A | Discharge status: Home / Self Care | Payer: Medicare Other | Source: Ambulatory Visit | Attending: Gastroenterology | Admitting: Gastroenterology

## 2017-04-11 DIAGNOSIS — R1012 Left upper quadrant pain: Secondary | ICD-10-CM | POA: Insufficient documentation

## 2017-04-11 DIAGNOSIS — M4185 Other forms of scoliosis, thoracolumbar region: Secondary | ICD-10-CM | POA: Insufficient documentation

## 2017-04-11 DIAGNOSIS — Z86711 Personal history of pulmonary embolism: Secondary | ICD-10-CM | POA: Insufficient documentation

## 2017-04-11 DIAGNOSIS — Z7901 Long term (current) use of anticoagulants: Secondary | ICD-10-CM | POA: Diagnosis not present

## 2017-04-11 DIAGNOSIS — Z9049 Acquired absence of other specified parts of digestive tract: Secondary | ICD-10-CM | POA: Diagnosis not present

## 2017-04-11 DIAGNOSIS — K449 Diaphragmatic hernia without obstruction or gangrene: Secondary | ICD-10-CM | POA: Insufficient documentation

## 2017-04-11 DIAGNOSIS — J9811 Atelectasis: Secondary | ICD-10-CM | POA: Diagnosis not present

## 2017-04-11 DIAGNOSIS — M47814 Spondylosis without myelopathy or radiculopathy, thoracic region: Secondary | ICD-10-CM | POA: Insufficient documentation

## 2017-04-11 DIAGNOSIS — Z90710 Acquired absence of both cervix and uterus: Secondary | ICD-10-CM | POA: Diagnosis not present

## 2017-04-11 MED ORDER — IOPAMIDOL (ISOVUE-300) INJECTION 61%
100.0000 mL | Freq: Once | INTRAVENOUS | Status: AC | PRN
  Administered 2017-04-11: 100 mL via INTRAVENOUS

## 2017-04-17 ENCOUNTER — Telehealth: Payer: Self-pay | Admitting: General Practice

## 2017-04-17 NOTE — Telephone Encounter (Signed)
Patient called in requesting her ct results she had done last week.  I explained to the patient our policy in the time it takes to relay results.  Routing to [place].

## 2017-04-18 ENCOUNTER — Other Ambulatory Visit: Payer: Self-pay

## 2017-04-18 DIAGNOSIS — Z299 Encounter for prophylactic measures, unspecified: Secondary | ICD-10-CM | POA: Diagnosis not present

## 2017-04-18 DIAGNOSIS — Z1331 Encounter for screening for depression: Secondary | ICD-10-CM | POA: Diagnosis not present

## 2017-04-18 DIAGNOSIS — Z79899 Other long term (current) drug therapy: Secondary | ICD-10-CM | POA: Diagnosis not present

## 2017-04-18 DIAGNOSIS — Z1339 Encounter for screening examination for other mental health and behavioral disorders: Secondary | ICD-10-CM | POA: Diagnosis not present

## 2017-04-18 DIAGNOSIS — R5383 Other fatigue: Secondary | ICD-10-CM | POA: Diagnosis not present

## 2017-04-18 DIAGNOSIS — Z0000 Encounter for general adult medical examination without abnormal findings: Secondary | ICD-10-CM | POA: Diagnosis not present

## 2017-04-18 DIAGNOSIS — Z6832 Body mass index (BMI) 32.0-32.9, adult: Secondary | ICD-10-CM | POA: Diagnosis not present

## 2017-04-18 DIAGNOSIS — E559 Vitamin D deficiency, unspecified: Secondary | ICD-10-CM | POA: Diagnosis not present

## 2017-04-18 DIAGNOSIS — Z7189 Other specified counseling: Secondary | ICD-10-CM | POA: Diagnosis not present

## 2017-04-18 MED ORDER — PANTOPRAZOLE SODIUM 40 MG PO TBEC: 40.0000 mg | DELAYED_RELEASE_TABLET | Freq: Every day | ORAL | 1 refills | Status: DC

## 2017-04-18 NOTE — Telephone Encounter (Signed)
See result note.  

## 2017-04-18 NOTE — Telephone Encounter (Incomplete)
Pt notified of results, Rx Pantoprazole 40mg  one tab po 30 min before breakfast with 1 rf has been sent to pharmacy. Routing message to [place] to schedule f/u apt with Dr. Gala Romney in 4-6 weeks as directed.

## 2017-04-18 NOTE — Progress Notes (Signed)
Please let patient noted that she has a moderate amount of stool and gas within the left colon, possibly related to constipation. Degenerative changes seen in the scoliotic thoracolumbar spine, some levels at least moderate in degree with possible associated nerve root impingement, suspected nerve root impingement on the left at L4-L5 level.  I would suggest f/u with Dr. Luna Glasgow regarding the spine findings. Radiologist is suspicious of nerve root impingement thoracic region as well as L4-L5. Could consider MR of the areas to determine for sure. Left upper quadrant pain may or may not be related to thoracic nerve impingement.  Would recommend two things to see if left upper quadrant pain improves. Would start miralax once capful daily, except hold for diarrhea buy otc. Start pantoprazole 40mg  daily, 30 minutes before breakfast. #30, 1 refill. Return to see Dr. Gala Romney in 4-6 weeks.

## 2017-04-19 ENCOUNTER — Encounter: Payer: Self-pay | Admitting: Internal Medicine

## 2017-04-19 NOTE — Progress Notes (Signed)
PATIENT SCHEDULED  °

## 2017-04-22 DIAGNOSIS — L280 Lichen simplex chronicus: Secondary | ICD-10-CM | POA: Diagnosis not present

## 2017-04-22 DIAGNOSIS — Z85828 Personal history of other malignant neoplasm of skin: Secondary | ICD-10-CM | POA: Diagnosis not present

## 2017-04-25 ENCOUNTER — Encounter: Payer: Self-pay | Admitting: Orthopaedic Surgery

## 2017-04-25 ENCOUNTER — Ambulatory Visit (INDEPENDENT_AMBULATORY_CARE_PROVIDER_SITE_OTHER): Payer: Medicare Other | Admitting: Orthopaedic Surgery

## 2017-04-25 ENCOUNTER — Ambulatory Visit (INDEPENDENT_AMBULATORY_CARE_PROVIDER_SITE_OTHER): Payer: Medicare Other

## 2017-04-25 VITALS — BP 138/75 | HR 67 | Temp 97.5°F | Ht 66.0 in | Wt 194.0 lb

## 2017-04-25 DIAGNOSIS — G8929 Other chronic pain: Secondary | ICD-10-CM | POA: Diagnosis not present

## 2017-04-25 DIAGNOSIS — M5442 Lumbago with sciatica, left side: Secondary | ICD-10-CM | POA: Diagnosis not present

## 2017-04-25 DIAGNOSIS — M25552 Pain in left hip: Secondary | ICD-10-CM

## 2017-04-25 NOTE — Progress Notes (Signed)
Patient Angela House, female DOB:[birth year], 81 y.o. NAT:[id number]  Chief Complaint  Patient presents with  . Back Pain  . Hip Pain    left    HPI  Angela House is a 81 y.o. female who is having lower back pain and left hip pain.  She had a CT of the abdomen and it showed: IMPRESSION: 1. No acute findings. No source for left upper quadrant pain identified. No bowel obstruction or evidence of bowel wall inflammation. No free fluid or abscess. No evidence of acute solid organ abnormality. No renal or ureteral calculi. 2. Moderate amount of stool and gas within the left colon and distal transverse colon (constipation? ). 3. Small hiatal hernia. 4. Degenerative changes of the scoliotic thoracolumbar spine, some levels at least moderate in degree, with possible associated nerve root impingements, suspected nerve root impingement on the left at the L4-5 level. If any history of chronic lower back pain or radiculopathic symptoms, would consider lumbar spine MRI for further characterization.  She has more pain of the left hip laterally.  I will order MRI of the lumbar spine as follow-up to suggestions from CT scan per above.   HPI  Body mass index is 31.31 kg/m.  ROS  Review of Systems  HENT: Negative for congestion.   Respiratory: Negative for cough and shortness of breath.   Cardiovascular: Negative for chest pain and leg swelling.  Gastrointestinal: Positive for abdominal pain.  Endocrine: Positive for cold intolerance.  Musculoskeletal: Positive for arthralgias, back pain and gait problem.  Allergic/Immunologic: Positive for environmental allergies.    Past Medical History:  Diagnosis Date  . Arthritis   . Hypertension   . PE (pulmonary thromboembolism) ([place])    bilaterally, 03/2017  . Restless leg syndrome     Past Surgical History:  Procedure Laterality Date  . ABDOMINAL HYSTERECTOMY    . CHOLECYSTECTOMY    . EYE SURGERY    . KNEE SURGERY    . WRIST  SURGERY      Family History  Problem Relation Age of Onset  . Arthritis Mother   . Hypertension Mother   . Hypertension Sister   . Pneumonia Brother   . Hypertension Sister   . Kidney disease Sister   . Colon cancer Neg Hx     Social History Social History  Substance Use Topics  . Smoking status: Never Smoker  . Smokeless tobacco: Never Used  . Alcohol use No    Allergies  Allergen Reactions  . Ciprofloxacin Hives  . Meclizine Other (See Comments)    dizziness    Current Outpatient Prescriptions  Medication Sig Dispense Refill  . apixaban (ELIQUIS) 5 MG TABS tablet Take 10mg  po bid for 7 days then 5mg  po bid (Patient taking differently: Take 5 mg by mouth daily. ) 60 tablet 1  . gabapentin (NEURONTIN) 100 MG capsule Take 100 mg by mouth 2 (two) times daily.     . pantoprazole (PROTONIX) 40 MG tablet Take 1 tablet (40 mg total) by mouth daily. 30 tablet 1  . VENTOLIN HFA 108 (90 Base) MCG/ACT inhaler Inhale 1-2 puffs into the lungs as needed.    Marland Kitchen acetaminophen (TYLENOL) 500 MG tablet Take 500 mg by mouth daily as needed for pain.     . ASPERCREME W/LIDOCAINE EX Apply 1 application topically daily as needed (FOR HIP/KNEE PAIN).     Marland Kitchen calcium carbonate (OS-CAL - DOSED IN MG OF ELEMENTAL CALCIUM) 1250 (500 Ca) MG tablet Take 1 tablet  by mouth 2 (two) times daily.     . Carboxymethylcellulose Sodium (THERATEARS) 0.25 % SOLN Apply 1 drop to eye daily.    . cetirizine (ZYRTEC) 10 MG tablet Take 10 mg by mouth daily.    . clobetasol cream (TEMOVATE) 1.54 % Apply 1 application topically daily as needed (FOR RASH).     Marland Kitchen cycloSPORINE (RESTASIS) 0.05 % ophthalmic emulsion Place 1 drop into both eyes 2 (two) times daily.    . diazepam (VALIUM) 2 MG tablet Take 2 mg by mouth every 6 (six) hours as needed for anxiety.    . famotidine (PEPCID) 20 MG tablet Take 20 mg by mouth every morning.     . fluticasone (FLONASE) 50 MCG/ACT nasal spray Place 2 sprays into the nose daily.      Marland Kitchen  HYDROcodone-acetaminophen (NORCO/VICODIN) 5-325 MG tablet     . metoprolol succinate (TOPROL-XL) 25 MG 24 hr tablet     . Multiple Vitamin (MULTIVITAMIN) capsule Take 1 capsule by mouth every morning.     . Omega-3 Fatty Acids (OMEGA-3 FISH OIL PO) Take 1,000 mg by mouth every morning.     . Potassium 99 MG TABS Take 1 tablet by mouth daily.     No current facility-administered medications for this visit.      Physical Exam  Blood pressure 138/75, pulse 67, temperature (!) 97.5 F (36.4 C), height 5\' 6"  (1.676 m), weight 194 lb (88 kg).  Constitutional: overall normal hygiene, normal nutrition, well developed, normal grooming, normal body habitus. Assistive device:none  Musculoskeletal: gait and station Limp none, muscle tone and strength are normal, no tremors or atrophy is present.  .  Neurological: coordination overall normal.  Deep tendon reflex/nerve stretch intact.  Sensation normal.  Cranial nerves II-XII intact.   Skin:   Normal overall no scars, lesions, ulcers or rashes. No psoriasis.  Psychiatric: Alert and oriented x 3.  Recent memory intact, remote memory unclear.  Normal mood and affect. Well groomed.  Good eye contact.  Cardiovascular: overall no swelling, no varicosities, no edema bilaterally, normal temperatures of the legs and arms, no clubbing, cyanosis and good capillary refill.  Lymphatic: palpation is normal.  All other systems reviewed and are negative   Left hip is tender over the trochanteric area.  She has no swelling or redness. ROM of the left hip is full.  I have reviewed the CT scan.  Lower back has no spasm.  ROM is good, NV intact.  The patient has been educated about the nature of the problem(s) and counseled on treatment options.  The patient appeared to understand what I have discussed and is in agreement with it.  Encounter Diagnosis  Name Primary?  . Left hip pain Yes   PROCEDURE NOTE:  The patient request injection, verbal consent was  obtained.  The left trochanteric area of the hip was prepped appropriately after time out was performed.   Sterile technique was observed and injection of 1 cc of Depo-Medrol 40 mg with several cc's of plain xylocaine. Anesthesia was provided by ethyl chloride and a 20-gauge needle was used to inject the hip area. The injection was tolerated well.  A band aid dressing was applied.  The patient was advised to apply ice later today and tomorrow to the injection sight as needed.   PLAN Call if any problems.  Precautions discussed.  Continue current medications.   Return to clinic after MRI of the lumbar spine   Electronically Signed Sanjuana Kava, MD 10/25/20182:32  PM   

## 2017-04-25 NOTE — Progress Notes (Signed)
ba

## 2017-04-26 DIAGNOSIS — J309 Allergic rhinitis, unspecified: Secondary | ICD-10-CM | POA: Diagnosis not present

## 2017-04-26 DIAGNOSIS — Z299 Encounter for prophylactic measures, unspecified: Secondary | ICD-10-CM | POA: Diagnosis not present

## 2017-04-26 DIAGNOSIS — I10 Essential (primary) hypertension: Secondary | ICD-10-CM | POA: Diagnosis not present

## 2017-04-26 DIAGNOSIS — J40 Bronchitis, not specified as acute or chronic: Secondary | ICD-10-CM | POA: Diagnosis not present

## 2017-04-26 DIAGNOSIS — I739 Peripheral vascular disease, unspecified: Secondary | ICD-10-CM | POA: Diagnosis not present

## 2017-04-26 DIAGNOSIS — I2699 Other pulmonary embolism without acute cor pulmonale: Secondary | ICD-10-CM | POA: Diagnosis not present

## 2017-04-26 DIAGNOSIS — K219 Gastro-esophageal reflux disease without esophagitis: Secondary | ICD-10-CM | POA: Diagnosis not present

## 2017-04-26 DIAGNOSIS — N183 Chronic kidney disease, stage 3 (moderate): Secondary | ICD-10-CM | POA: Diagnosis not present

## 2017-04-26 DIAGNOSIS — Z6832 Body mass index (BMI) 32.0-32.9, adult: Secondary | ICD-10-CM | POA: Diagnosis not present

## 2017-04-27 DIAGNOSIS — Z23 Encounter for immunization: Secondary | ICD-10-CM | POA: Diagnosis not present

## 2017-04-29 DIAGNOSIS — L259 Unspecified contact dermatitis, unspecified cause: Secondary | ICD-10-CM | POA: Diagnosis not present

## 2017-04-30 DIAGNOSIS — Z7901 Long term (current) use of anticoagulants: Secondary | ICD-10-CM | POA: Diagnosis not present

## 2017-04-30 DIAGNOSIS — L259 Unspecified contact dermatitis, unspecified cause: Secondary | ICD-10-CM | POA: Diagnosis not present

## 2017-04-30 DIAGNOSIS — I10 Essential (primary) hypertension: Secondary | ICD-10-CM | POA: Diagnosis not present

## 2017-04-30 DIAGNOSIS — L309 Dermatitis, unspecified: Secondary | ICD-10-CM | POA: Diagnosis not present

## 2017-05-06 DIAGNOSIS — M4727 Other spondylosis with radiculopathy, lumbosacral region: Secondary | ICD-10-CM | POA: Diagnosis not present

## 2017-05-06 DIAGNOSIS — M4726 Other spondylosis with radiculopathy, lumbar region: Secondary | ICD-10-CM | POA: Diagnosis not present

## 2017-05-06 DIAGNOSIS — M5116 Intervertebral disc disorders with radiculopathy, lumbar region: Secondary | ICD-10-CM | POA: Diagnosis not present

## 2017-05-10 DIAGNOSIS — D3131 Benign neoplasm of right choroid: Secondary | ICD-10-CM | POA: Diagnosis not present

## 2017-05-10 DIAGNOSIS — D3130 Benign neoplasm of unspecified choroid: Secondary | ICD-10-CM | POA: Diagnosis not present

## 2017-05-10 DIAGNOSIS — H04123 Dry eye syndrome of bilateral lacrimal glands: Secondary | ICD-10-CM | POA: Diagnosis not present

## 2017-05-10 DIAGNOSIS — D3132 Benign neoplasm of left choroid: Secondary | ICD-10-CM | POA: Diagnosis not present

## 2017-05-10 DIAGNOSIS — Z961 Presence of intraocular lens: Secondary | ICD-10-CM | POA: Diagnosis not present

## 2017-05-15 ENCOUNTER — Encounter: Payer: Self-pay | Admitting: Orthopaedic Surgery

## 2017-05-15 ENCOUNTER — Ambulatory Visit (INDEPENDENT_AMBULATORY_CARE_PROVIDER_SITE_OTHER): Payer: Medicare Other | Admitting: Orthopaedic Surgery

## 2017-05-15 VITALS — BP 130/76 | HR 70 | Temp 97.5°F | Ht 66.0 in | Wt 194.0 lb

## 2017-05-15 DIAGNOSIS — Z299 Encounter for prophylactic measures, unspecified: Secondary | ICD-10-CM | POA: Diagnosis not present

## 2017-05-15 DIAGNOSIS — G8929 Other chronic pain: Secondary | ICD-10-CM

## 2017-05-15 DIAGNOSIS — N183 Chronic kidney disease, stage 3 (moderate): Secondary | ICD-10-CM | POA: Diagnosis not present

## 2017-05-15 DIAGNOSIS — Z6832 Body mass index (BMI) 32.0-32.9, adult: Secondary | ICD-10-CM | POA: Diagnosis not present

## 2017-05-15 DIAGNOSIS — Z789 Other specified health status: Secondary | ICD-10-CM | POA: Diagnosis not present

## 2017-05-15 DIAGNOSIS — M5442 Lumbago with sciatica, left side: Secondary | ICD-10-CM | POA: Diagnosis not present

## 2017-05-15 DIAGNOSIS — I10 Essential (primary) hypertension: Secondary | ICD-10-CM | POA: Diagnosis not present

## 2017-05-15 DIAGNOSIS — J069 Acute upper respiratory infection, unspecified: Secondary | ICD-10-CM | POA: Diagnosis not present

## 2017-05-15 NOTE — Progress Notes (Signed)
Patient [name], female DOB:18-Jul-1935, 81 y.o. LOV:[id number]  Chief Complaint  Patient presents with  . Results    MRI Lumbar    HPI  Angela House is a 81 y.o. female who has lower back pain and buttock pain.  She had MRI at [facility] and it showed:  midlumbar disc degenerative changes, facet arthrosis and levoscoliosis with right L3-4 subarticular recess and foraminal stenosis and also at L4-5 on the left.  She cannot take NSAIDs secondary to prior blood clots.  I have told her to take Tylenol, up to three times a day.  Be active.   HPI  Body mass index is 31.31 kg/m.  ROS  Review of Systems  HENT: Negative for congestion.   Respiratory: Negative for cough and shortness of breath.   Cardiovascular: Negative for chest pain and leg swelling.  Gastrointestinal: Positive for abdominal pain.  Endocrine: Positive for cold intolerance.  Musculoskeletal: Positive for arthralgias, back pain and gait problem.  Allergic/Immunologic: Positive for environmental allergies.    Past Medical History:  Diagnosis Date  . Arthritis   . Hypertension   . PE (pulmonary thromboembolism) ([place])    bilaterally, 03/2017  . Restless leg syndrome     Past Surgical History:  Procedure Laterality Date  . ABDOMINAL HYSTERECTOMY    . CHOLECYSTECTOMY    . EYE SURGERY    . KNEE SURGERY    . WRIST SURGERY      Family History  Problem Relation Age of Onset  . Arthritis Mother   . Hypertension Mother   . Hypertension Sister   . Pneumonia Brother   . Hypertension Sister   . Kidney disease Sister   . Colon cancer Neg Hx     Social History Social History   Tobacco Use  . Smoking status: Never Smoker  . Smokeless tobacco: Never Used  Substance Use Topics  . Alcohol use: No    Alcohol/week: 0.0 oz  . Drug use: No    Allergies  Allergen Reactions  . Ciprofloxacin Hives  . Meclizine Other (See Comments)    dizziness    Current Outpatient Medications  Medication Sig  Dispense Refill  . acetaminophen (TYLENOL) 500 MG tablet Take 500 mg by mouth daily as needed for pain.     Marland Kitchen apixaban (ELIQUIS) 5 MG TABS tablet Take 10mg  po bid for 7 days then 5mg  po bid (Patient taking differently: Take 5 mg by mouth daily. ) 60 tablet 1  . ASPERCREME W/LIDOCAINE EX Apply 1 application topically daily as needed (FOR HIP/KNEE PAIN).     Marland Kitchen calcium carbonate (OS-CAL - DOSED IN MG OF ELEMENTAL CALCIUM) 1250 (500 Ca) MG tablet Take 1 tablet by mouth 2 (two) times daily.     . Carboxymethylcellulose Sodium (THERATEARS) 0.25 % SOLN Apply 1 drop to eye daily.    . cetirizine (ZYRTEC) 10 MG tablet Take 10 mg by mouth daily.    . clobetasol cream (TEMOVATE) 8.84 % Apply 1 application topically daily as needed (FOR RASH).     Marland Kitchen cycloSPORINE (RESTASIS) 0.05 % ophthalmic emulsion Place 1 drop into both eyes 2 (two) times daily.    . diazepam (VALIUM) 2 MG tablet Take 2 mg by mouth every 6 (six) hours as needed for anxiety.    . famotidine (PEPCID) 20 MG tablet Take 20 mg by mouth every morning.     . fluticasone (FLONASE) 50 MCG/ACT nasal spray Place 2 sprays into the nose daily.      Marland Kitchen  gabapentin (NEURONTIN) 100 MG capsule Take 100 mg by mouth 2 (two) times daily.     Marland Kitchen HYDROcodone-acetaminophen (NORCO/VICODIN) 5-325 MG tablet     . metoprolol succinate (TOPROL-XL) 25 MG 24 hr tablet     . Multiple Vitamin (MULTIVITAMIN) capsule Take 1 capsule by mouth every morning.     . Omega-3 Fatty Acids (OMEGA-3 FISH OIL PO) Take 1,000 mg by mouth every morning.     . pantoprazole (PROTONIX) 40 MG tablet Take 1 tablet (40 mg total) by mouth daily. 30 tablet 1  . Potassium 99 MG TABS Take 1 tablet by mouth daily.    . VENTOLIN HFA 108 (90 Base) MCG/ACT inhaler Inhale 1-2 puffs into the lungs as needed.     No current facility-administered medications for this visit.      Physical Exam  Blood pressure 130/76, pulse 70, temperature (!) 97.5 F (36.4 C), height 5\' 6"  (1.676 m), weight 194 lb  (88 kg).  Constitutional: overall normal hygiene, normal nutrition, well developed, normal grooming, normal body habitus. Assistive device:none  Musculoskeletal: gait and station Limp none, muscle tone and strength are normal, no tremors or atrophy is present.  .  Neurological: coordination overall normal.  Deep tendon reflex/nerve stretch intact.  Sensation normal.  Cranial nerves II-XII intact.   Skin:   Normal overall no scars, lesions, ulcers or rashes. No psoriasis.  Psychiatric: Alert and oriented x 3.  Recent memory intact, remote memory unclear.  Normal mood and affect. Well groomed.  Good eye contact.  Cardiovascular: overall no swelling, no varicosities, no edema bilaterally, normal temperatures of the legs and arms, no clubbing, cyanosis and good capillary refill.  Lymphatic: palpation is normal.  All other systems reviewed and are negative   Spine/Pelvis examination:  Inspection:  Overall, sacoiliac joint benign and hips nontender; without crepitus or defects.   Thoracic spine inspection: Alignment normal without kyphosis present   Lumbar spine inspection:  Alignment  with normal lumbar lordosis, with scoliosis apparent.   Thoracic spine palpation:  without tenderness of spinal processes   Lumbar spine palpation: with tenderness of lumbar area; without tightness of lumbar muscles    Range of Motion:   Lumbar flexion, forward flexion is 30 without pain or tenderness    Lumbar extension is 5 without pain or tenderness   Left lateral bend is Normal  without pain or tenderness   Right lateral bend is Normal without pain or tenderness   Straight leg raising is Normal   Strength & tone: Normal   Stability overall normal stability    The patient has been educated about the nature of the problem(s) and counseled on treatment options.  The patient appeared to understand what I have discussed and is in agreement with it.  Encounter Diagnosis  Name Primary?  . Chronic  left-sided low back pain with left-sided sciatica Yes    PLAN Call if any problems.  Precautions discussed.  Continue current medications.   Return to clinic 1 month   Electronically Signed Sanjuana Kava, MD 11/14/20182:44 PM

## 2017-05-21 ENCOUNTER — Ambulatory Visit: Payer: Medicare Other | Admitting: Internal Medicine

## 2017-05-28 DIAGNOSIS — N183 Chronic kidney disease, stage 3 (moderate): Secondary | ICD-10-CM | POA: Diagnosis not present

## 2017-05-28 DIAGNOSIS — E2839 Other primary ovarian failure: Secondary | ICD-10-CM | POA: Diagnosis not present

## 2017-05-28 DIAGNOSIS — Z6832 Body mass index (BMI) 32.0-32.9, adult: Secondary | ICD-10-CM | POA: Diagnosis not present

## 2017-05-28 DIAGNOSIS — Z299 Encounter for prophylactic measures, unspecified: Secondary | ICD-10-CM | POA: Diagnosis not present

## 2017-05-28 DIAGNOSIS — K219 Gastro-esophageal reflux disease without esophagitis: Secondary | ICD-10-CM | POA: Diagnosis not present

## 2017-05-28 DIAGNOSIS — I2699 Other pulmonary embolism without acute cor pulmonale: Secondary | ICD-10-CM | POA: Diagnosis not present

## 2017-06-04 ENCOUNTER — Ambulatory Visit: Payer: Medicare Other | Admitting: Internal Medicine

## 2017-06-05 ENCOUNTER — Telehealth: Payer: Self-pay | Admitting: Internal Medicine

## 2017-06-05 DIAGNOSIS — K219 Gastro-esophageal reflux disease without esophagitis: Secondary | ICD-10-CM

## 2017-06-05 NOTE — Telephone Encounter (Signed)
Pt was scheduled to see RMR 06/04/17 and r/s to [id number]. Pt needs a refill of Pantoprazole 40mg  one tab daily sent to [facility].

## 2017-06-05 NOTE — Telephone Encounter (Signed)
Pt was rescheduled to see RMR in January. She was supposed to have seen him yesterday, but was rescheduled due to him being out of the office. She is needing a prescription of pantoprazole called to [facility].

## 2017-06-10 MED ORDER — PANTOPRAZOLE SODIUM 40 MG PO TBEC: 40.0000 mg | DELAYED_RELEASE_TABLET | Freq: Every day | ORAL | 3 refills | Status: DC

## 2017-06-10 NOTE — Telephone Encounter (Signed)
Rx sent to pharmacy   

## 2017-06-10 NOTE — Addendum Note (Signed)
Addended by: Gordy Levan, Waylyn Tenbrink A on: 06/10/2017 12:28 PM   Modules accepted: Orders

## 2017-06-12 ENCOUNTER — Ambulatory Visit: Payer: Medicare Other | Admitting: Orthopaedic Surgery

## 2017-06-13 ENCOUNTER — Other Ambulatory Visit: Payer: Self-pay | Admitting: Gastroenterology

## 2017-06-17 DIAGNOSIS — Z1231 Encounter for screening mammogram for malignant neoplasm of breast: Secondary | ICD-10-CM | POA: Diagnosis not present

## 2017-06-20 ENCOUNTER — Ambulatory Visit (INDEPENDENT_AMBULATORY_CARE_PROVIDER_SITE_OTHER): Payer: Medicare Other | Admitting: Orthopaedic Surgery

## 2017-06-20 ENCOUNTER — Encounter: Payer: Self-pay | Admitting: Orthopaedic Surgery

## 2017-06-20 VITALS — BP 131/71 | HR 73 | Temp 97.4°F | Ht 66.0 in | Wt 194.0 lb

## 2017-06-20 DIAGNOSIS — G8929 Other chronic pain: Secondary | ICD-10-CM

## 2017-06-20 DIAGNOSIS — M5442 Lumbago with sciatica, left side: Secondary | ICD-10-CM | POA: Diagnosis not present

## 2017-06-20 DIAGNOSIS — G2581 Restless legs syndrome: Secondary | ICD-10-CM | POA: Diagnosis not present

## 2017-06-20 DIAGNOSIS — M79671 Pain in right foot: Secondary | ICD-10-CM | POA: Diagnosis not present

## 2017-06-20 NOTE — Progress Notes (Signed)
Patient Angela House, female DOB:[birth year], 81 y.o. VPX:[id number]  Chief Complaint  Patient presents with  . Back Pain    HPI  Angela House is a 81 y.o. female who has chronic lower back pain.  She has had some increased pain with the cold weather.  She has no new trauma.  She has some left sided paresthesias.  She has no weakness.  She is taking Neurontin for the restless leg syndrome and peripheral neuropathy.  It is helping.  She has some pain of the right lateral foot. She has no fall, no trauma, no redness.  She has used a rub and it has helped.  She has no new shoe wear changes. HPI  Body mass index is 31.31 kg/m.  ROS  Review of Systems  HENT: Negative for congestion.   Respiratory: Negative for cough and shortness of breath.   Cardiovascular: Negative for chest pain and leg swelling.  Gastrointestinal: Positive for abdominal pain.  Endocrine: Positive for cold intolerance.  Musculoskeletal: Positive for arthralgias, back pain and gait problem.  Allergic/Immunologic: Positive for environmental allergies.  All other systems reviewed and are negative.   Past Medical History:  Diagnosis Date  . Arthritis   . Hypertension   . PE (pulmonary thromboembolism) ([place])    bilaterally, 03/2017  . Restless leg syndrome     Past Surgical History:  Procedure Laterality Date  . ABDOMINAL HYSTERECTOMY    . CHOLECYSTECTOMY    . EYE SURGERY    . KNEE SURGERY    . WRIST SURGERY      Family History  Problem Relation Age of Onset  . Arthritis Mother   . Hypertension Mother   . Hypertension Sister   . Pneumonia Brother   . Hypertension Sister   . Kidney disease Sister   . Colon cancer Neg Hx     Social History Social History   Tobacco Use  . Smoking status: Never Smoker  . Smokeless tobacco: Never Used  Substance Use Topics  . Alcohol use: No    Alcohol/week: 0.0 oz  . Drug use: No    Allergies  Allergen Reactions  . Ciprofloxacin Hives  . Meclizine  Other (See Comments)    dizziness    Current Outpatient Medications  Medication Sig Dispense Refill  . acetaminophen (TYLENOL) 500 MG tablet Take 500 mg by mouth daily as needed for pain.     Marland Kitchen apixaban (ELIQUIS) 5 MG TABS tablet Take 10mg  po bid for 7 days then 5mg  po bid (Patient taking differently: Take 5 mg by mouth daily. ) 60 tablet 1  . ASPERCREME W/LIDOCAINE EX Apply 1 application topically daily as needed (FOR HIP/KNEE PAIN).     Marland Kitchen calcium carbonate (OS-CAL - DOSED IN MG OF ELEMENTAL CALCIUM) 1250 (500 Ca) MG tablet Take 1 tablet by mouth 2 (two) times daily.     . Carboxymethylcellulose Sodium (THERATEARS) 0.25 % SOLN Apply 1 drop to eye daily.    . cetirizine (ZYRTEC) 10 MG tablet Take 10 mg by mouth daily.    . clobetasol cream (TEMOVATE) 4.62 % Apply 1 application topically daily as needed (FOR RASH).     Marland Kitchen cycloSPORINE (RESTASIS) 0.05 % ophthalmic emulsion Place 1 drop into both eyes 2 (two) times daily.    . diazepam (VALIUM) 2 MG tablet Take 2 mg by mouth every 6 (six) hours as needed for anxiety.    . famotidine (PEPCID) 20 MG tablet Take 20 mg by mouth every morning.     Marland Kitchen  fluticasone (FLONASE) 50 MCG/ACT nasal spray Place 2 sprays into the nose daily.      Marland Kitchen gabapentin (NEURONTIN) 100 MG capsule Take 100 mg by mouth 2 (two) times daily.     Marland Kitchen HYDROcodone-acetaminophen (NORCO/VICODIN) 5-325 MG tablet     . metoprolol succinate (TOPROL-XL) 25 MG 24 hr tablet     . Multiple Vitamin (MULTIVITAMIN) capsule Take 1 capsule by mouth every morning.     . Omega-3 Fatty Acids (OMEGA-3 FISH OIL PO) Take 1,000 mg by mouth every morning.     . pantoprazole (PROTONIX) 40 MG tablet Take 1 tablet (40 mg total) by mouth daily. 30 tablet 3  . pantoprazole (PROTONIX) 40 MG tablet TAKE 1 TABLET BY MOUTH ONCE DAILY 90 tablet 3  . Potassium 99 MG TABS Take 1 tablet by mouth daily.    . VENTOLIN HFA 108 (90 Base) MCG/ACT inhaler Inhale 1-2 puffs into the lungs as needed.     No current  facility-administered medications for this visit.      Physical Exam  Blood pressure 131/71, pulse 73, temperature (!) 97.4 F (36.3 C), height 5\' 6"  (1.676 m), weight 194 lb (88 kg).  Constitutional: overall normal hygiene, normal nutrition, well developed, normal grooming, normal body habitus. Assistive device:none  Musculoskeletal: gait and station Limp none, muscle tone and strength are normal, no tremors or atrophy is present.  .  Neurological: coordination overall normal.  Deep tendon reflex/nerve stretch intact.  Sensation normal.  Cranial nerves II-XII intact.   Skin:   Normal overall no scars, lesions, ulcers or rashes. No psoriasis.  Psychiatric: Alert and oriented x 3.  Recent memory intact, remote memory unclear.  Normal mood and affect. Well groomed.  Good eye contact.  Cardiovascular: overall no swelling, no varicosities, no edema bilaterally, normal temperatures of the legs and arms, no clubbing, cyanosis and good capillary refill.  Lymphatic: palpation is normal.  All other systems reviewed and are negative   Spine/Pelvis examination:  Inspection:  Overall, sacoiliac joint benign and hips nontender; without crepitus or defects.   Thoracic spine inspection: Alignment normal without kyphosis present   Lumbar spine inspection:  Alignment  with normal lumbar lordosis, without scoliosis apparent.   Thoracic spine palpation:  without tenderness of spinal processes   Lumbar spine palpation: without tenderness of lumbar area; without tightness of lumbar muscles    Range of Motion:   Lumbar flexion, forward flexion is normal without pain or tenderness    Lumbar extension is full without pain or tenderness   Left lateral bend is normal without pain or tenderness   Right lateral bend is normal without pain or tenderness   Straight leg raising is normal  Strength & tone: normal   Stability overall normal stability  The right foot has tenderness at the base of the  fifth metatarsal but no swelling, no redness, no fluctuance. ROM is full.  NV is intact.  She has no weakness or limp.    The patient has been educated about the nature of the problem(s) and counseled on treatment options.  The patient appeared to understand what I have discussed and is in agreement with it.  Encounter Diagnoses  Name Primary?  . Chronic left-sided low back pain with left-sided sciatica Yes  . Pain in right foot   . Restless leg syndrome     PLAN Call if any problems.  Precautions discussed.  Continue current medications.   Return to clinic 2 months   Electronically Jenner  Luna Glasgow, MD 12/20/201810:09 AM

## 2017-06-28 DIAGNOSIS — Z299 Encounter for prophylactic measures, unspecified: Secondary | ICD-10-CM | POA: Diagnosis not present

## 2017-06-28 DIAGNOSIS — Z6832 Body mass index (BMI) 32.0-32.9, adult: Secondary | ICD-10-CM | POA: Diagnosis not present

## 2017-06-28 DIAGNOSIS — F419 Anxiety disorder, unspecified: Secondary | ICD-10-CM | POA: Diagnosis not present

## 2017-06-28 DIAGNOSIS — I739 Peripheral vascular disease, unspecified: Secondary | ICD-10-CM | POA: Diagnosis not present

## 2017-06-28 DIAGNOSIS — I2699 Other pulmonary embolism without acute cor pulmonale: Secondary | ICD-10-CM | POA: Diagnosis not present

## 2017-06-28 DIAGNOSIS — I10 Essential (primary) hypertension: Secondary | ICD-10-CM | POA: Diagnosis not present

## 2017-07-03 DIAGNOSIS — L720 Epidermal cyst: Secondary | ICD-10-CM | POA: Diagnosis not present

## 2017-07-11 DIAGNOSIS — D485 Neoplasm of uncertain behavior of skin: Secondary | ICD-10-CM | POA: Diagnosis not present

## 2017-07-11 DIAGNOSIS — L720 Epidermal cyst: Secondary | ICD-10-CM | POA: Diagnosis not present

## 2017-07-15 DIAGNOSIS — L0100 Impetigo, unspecified: Secondary | ICD-10-CM | POA: Diagnosis not present

## 2017-07-16 ENCOUNTER — Other Ambulatory Visit: Payer: Self-pay

## 2017-07-16 ENCOUNTER — Ambulatory Visit (INDEPENDENT_AMBULATORY_CARE_PROVIDER_SITE_OTHER): Payer: Medicare Other | Admitting: Internal Medicine

## 2017-07-16 ENCOUNTER — Encounter: Payer: Self-pay | Admitting: Internal Medicine

## 2017-07-16 VITALS — BP 126/80 | HR 73 | Temp 97.0°F | Ht 65.0 in | Wt 199.0 lb

## 2017-07-16 DIAGNOSIS — K219 Gastro-esophageal reflux disease without esophagitis: Secondary | ICD-10-CM

## 2017-07-16 NOTE — Patient Instructions (Signed)
GERD information provided  Elevate head of bed 30 degrees with blocks or foam wedge  Decrease Protonix 20 mg daily (Rx #30 - 20 mg daily - 11 refills)  Office visit in 6 months

## 2017-07-16 NOTE — Progress Notes (Signed)
Primary Care Physician:  Monico Blitz, MD Primary Gastroenterologist:  Dr. Gala Romney  Pre-Procedure History & Physical: HPI:  Angela House is a 82 y.o. female here for  Follow-up of GERD. GERD well controlled on Protonix 40 mg daily although she does have occasional bouts of nocturnal reflux when she eats late at night. Relatively newly diagnosed a pulmonary embolus. On chronic anticoagulation now. She still says she has multiple persisting questions about the origin of her pulmonary embolus and how long she is going to have to take anticoagulation.  She states she is concerned about the side effects of Protonix potentially adversely affecting her mental status and renal function.  Past Medical History:  Diagnosis Date  . Arthritis   . Hypertension   . PE (pulmonary thromboembolism) ([place])    bilaterally, 03/2017  . Restless leg syndrome     Past Surgical History:  Procedure Laterality Date  . ABDOMINAL HYSTERECTOMY    . CHOLECYSTECTOMY    . EYE SURGERY    . KNEE SURGERY    . WRIST SURGERY      Prior to Admission medications   Medication Sig Start Date End Date Taking? Authorizing Provider  acetaminophen (TYLENOL) 500 MG tablet Take 500 mg by mouth daily as needed for pain.    Yes [provider]  apixaban (ELIQUIS) 5 MG TABS tablet Take 10mg  po bid for 7 days then 5mg  po bid Patient taking differently: Take 5 mg by mouth daily.  03/06/17  Yes Kathie Dike, MD  ASPERCREME W/LIDOCAINE EX Apply 1 application topically daily as needed (FOR HIP/KNEE PAIN).    Yes [provider]  calcium carbonate (OS-CAL - DOSED IN MG OF ELEMENTAL CALCIUM) 1250 (500 Ca) MG tablet Take 1 tablet by mouth 2 (two) times daily.    Yes [provider]  Carboxymethylcellulose Sodium (THERATEARS) 0.25 % SOLN Apply 1 drop to eye daily.   Yes [provider]  cetirizine (ZYRTEC) 10 MG tablet Take 10 mg by mouth as needed.    Yes [provider]  clobetasol cream  (TEMOVATE) 5.39 % Apply 1 application topically daily as needed (FOR RASH).  12/13/15  Yes [provider]  cycloSPORINE (RESTASIS) 0.05 % ophthalmic emulsion Place 1 drop into both eyes 2 (two) times daily.   Yes [provider]  diazepam (VALIUM) 2 MG tablet Take 2 mg by mouth every 6 (six) hours as needed for anxiety.   Yes [provider]  fluticasone (FLONASE) 50 MCG/ACT nasal spray Place 2 sprays into the nose as needed.    Yes [provider]  gabapentin (NEURONTIN) 100 MG capsule Take 100 mg by mouth 2 (two) times daily.    Yes [provider]  Multiple Vitamin (MULTIVITAMIN) capsule Take 1 capsule by mouth every morning.    Yes [provider]  pantoprazole (PROTONIX) 40 MG tablet Take 1 tablet (40 mg total) by mouth daily. 06/10/17  Yes Carlis Stable, NP  pantoprazole (PROTONIX) 40 MG tablet TAKE 1 TABLET BY MOUTH ONCE DAILY 06/13/17  Yes Annitta Needs, NP  Potassium 99 MG TABS Take 1 tablet by mouth every other day.    Yes [provider]  sulfamethoxazole-trimethoprim (BACTRIM DS,SEPTRA DS) 800-160 MG tablet Take 1 tablet by mouth 2 (two) times daily. 07/15/17  Yes [provider]  VENTOLIN HFA 108 (90 Base) MCG/ACT inhaler Inhale 1-2 puffs into the lungs as needed. 03/24/17  Yes [provider]  famotidine (PEPCID) 20 MG tablet  Take 20 mg by mouth every morning.     [provider]  HYDROcodone-acetaminophen (NORCO/VICODIN) 5-325 MG tablet  03/22/17   [provider]  metoprolol succinate (TOPROL-XL) 25 MG 24 hr tablet  03/06/17   [provider]  Omega-3 Fatty Acids (OMEGA-3 FISH OIL PO) Take 1,000 mg by mouth every morning.     [provider]    Allergies as of 07/16/2017 - Review Complete 07/16/2017  Allergen Reaction Noted  . Ciprofloxacin Hives 09/21/2015  . Meclizine Other (See Comments) 09/21/2015    Family History  Problem Relation Age of Onset  . Arthritis  Mother   . Hypertension Mother   . Hypertension Sister   . Pneumonia Brother   . Hypertension Sister   . Kidney disease Sister   . Colon cancer Neg Hx     Social History   Socioeconomic History  . Marital status: Widowed    Spouse name: Not on file  . Number of children: Not on file  . Years of education: Not on file  . Highest education level: Not on file  Social Needs  . Financial resource strain: Not on file  . Food insecurity - worry: Not on file  . Food insecurity - inability: Not on file  . Transportation needs - medical: Not on file  . Transportation needs - non-medical: Not on file  Occupational History  . Not on file  Tobacco Use  . Smoking status: Never Smoker  . Smokeless tobacco: Never Used  Substance and Sexual Activity  . Alcohol use: No    Alcohol/week: 0.0 oz  . Drug use: No  . Sexual activity: Not on file  Other Topics Concern  . Not on file  Social History Narrative  . Not on file    Review of Systems: See HPI, otherwise negative ROS  Physical Exam: BP 126/80   Pulse 73   Temp (!) 97 F (36.1 C) (Oral)   Ht 5\' 5"  (1.651 m)   Wt 199 lb (90.3 kg)   BMI 33.12 kg/m  General:   Alert,   pleasant and cooperative in NAD  Impression:   Pleasant 82 year old lady with long-standing GERD responsive to PPI therapy. We talked about the date of this out in the literature regarding health risk of taking chronic acid suppression therapy. Overall, data is weak on supporting a causative relationship between renal issues, dementia etc. With PPIs. A good risk-benefit analysis continues to be the best way to individualized treatment for a given patient. We discussed effective treatment of her reflux while getting it done on the very least amount of medication possible is a conservative approach.    Recommendations:    GERD information provided  Elevate head of bed 30 degrees with blocks or foam wedge  Decrease Protonix 20 mg daily (Rx #30 - 20 mg daily -  11 refills)  Office visit in 6 months  Patient should follow-up Dr. Manuella Ghazi regarding her questions about pulmonary embolus and obtaining more information about duration of anticoagulation.          Notice: This dictation was prepared with Dragon dictation along with smaller phrase technology. Any transcriptional errors that result from this process are unintentional and may not be corrected upon review.

## 2017-07-25 DIAGNOSIS — Z4802 Encounter for removal of sutures: Secondary | ICD-10-CM | POA: Diagnosis not present

## 2017-07-30 ENCOUNTER — Encounter (HOSPITAL_COMMUNITY): Payer: Self-pay | Admitting: Emergency Medicine

## 2017-07-30 ENCOUNTER — Other Ambulatory Visit: Payer: Self-pay

## 2017-07-30 ENCOUNTER — Emergency Department (HOSPITAL_COMMUNITY)
Admission: EM | Admit: 2017-07-30 | Discharge: 2017-07-31 | Disposition: A | Discharge status: Home / Self Care | Payer: Medicare Other | Attending: Emergency Medicine | Admitting: Emergency Medicine

## 2017-07-30 ENCOUNTER — Emergency Department (HOSPITAL_COMMUNITY): Payer: Medicare Other

## 2017-07-30 DIAGNOSIS — I2699 Other pulmonary embolism without acute cor pulmonale: Secondary | ICD-10-CM | POA: Insufficient documentation

## 2017-07-30 DIAGNOSIS — Z79899 Other long term (current) drug therapy: Secondary | ICD-10-CM | POA: Insufficient documentation

## 2017-07-30 DIAGNOSIS — I10 Essential (primary) hypertension: Secondary | ICD-10-CM | POA: Insufficient documentation

## 2017-07-30 DIAGNOSIS — M7989 Other specified soft tissue disorders: Secondary | ICD-10-CM | POA: Diagnosis not present

## 2017-07-30 DIAGNOSIS — Z7901 Long term (current) use of anticoagulants: Secondary | ICD-10-CM | POA: Diagnosis not present

## 2017-07-30 DIAGNOSIS — R079 Chest pain, unspecified: Secondary | ICD-10-CM | POA: Insufficient documentation

## 2017-07-30 DIAGNOSIS — R0789 Other chest pain: Secondary | ICD-10-CM | POA: Diagnosis not present

## 2017-07-30 DIAGNOSIS — R0602 Shortness of breath: Secondary | ICD-10-CM | POA: Diagnosis not present

## 2017-07-30 LAB — CBC
HCT: 41.4 % (ref 36.0–46.0)
HEMOGLOBIN: 13.6 g/dL (ref 12.0–15.0)
MCH: 29.6 pg (ref 26.0–34.0)
MCHC: 32.9 g/dL (ref 30.0–36.0)
MCV: 90 fL (ref 78.0–100.0)
Platelets: 268 10*3/uL (ref 150–400)
RBC: 4.6 MIL/uL (ref 3.87–5.11)
RDW: 16.4 % — ABNORMAL HIGH (ref 11.5–15.5)
WBC: 6.7 10*3/uL (ref 4.0–10.5)

## 2017-07-30 LAB — BASIC METABOLIC PANEL
ANION GAP: 11 (ref 5–15)
BUN: 14 mg/dL (ref 6–20)
CALCIUM: 10 mg/dL (ref 8.9–10.3)
CO2: 25 mmol/L (ref 22–32)
Chloride: 106 mmol/L (ref 101–111)
Creatinine, Ser: 0.87 mg/dL (ref 0.44–1.00)
GFR calc Af Amer: >60 mL/min (ref >60)
GLUCOSE: 81 mg/dL (ref 65–99)
POTASSIUM: 4 mmol/L (ref 3.5–5.1)
Sodium: 142 mmol/L (ref 135–145)

## 2017-07-30 LAB — I-STAT TROPONIN, ED: TROPONIN I, POC: 0.01 ng/mL (ref 0.00–0.08)

## 2017-07-30 MED ORDER — IOPAMIDOL (ISOVUE-370) INJECTION 76%
100.0000 mL | Freq: Once | INTRAVENOUS | Status: AC | PRN
  Administered 2017-07-30: 100 mL via INTRAVENOUS

## 2017-07-30 MED ORDER — KETOROLAC TROMETHAMINE 30 MG/ML IJ SOLN
15.0000 mg | Freq: Once | INTRAMUSCULAR | Status: AC
  Administered 2017-07-30: 15 mg via INTRAVENOUS
  Filled 2017-07-30: qty 1

## 2017-07-30 MED ORDER — IBUPROFEN 400 MG PO TABS: 400.0000 mg | ORAL_TABLET | Freq: Four times a day (QID) | ORAL | 0 refills | Status: DC | PRN

## 2017-07-30 NOTE — ED Triage Notes (Signed)
Patient reports chest pain that started on Saturday. Patient states she has had increased pain today. Patient denies n/v or diaphoresis. Patient has history of bilat PE.

## 2017-07-30 NOTE — Discharge Instructions (Signed)
Your testing revealed that your lungs are much more clear without large blood clots.  There is still a very small blood clot at the bottom of your right lung but this is getting better. Your blood work was unremarkable and reassuring Your EKG did not show any signs of heart attack We have given you a dose of an anti-inflammatory and you should continue this anti-inflammatory for the next couple of days only if you are having pain Follow-up with your doctor for a recheck within the next 2-3 days if still having symptoms or the emergency department for worsening symptoms If you would like to follow-up with a doctor in this area please see the list below.  [facility] Primary Care Doctor List    Sinda Du MD. Specialty: Pulmonary Disease Contact information: [place]  [place] [address]  [phone]   Tula Nakayama, MD. Specialty: [facility] Medicine Contact information: [address] [place] [address]  [phone]   Sallee Lange, MD. Specialty: [facility] Medicine Contact information: [place]  [place] [address]  [phone]   Rosita Fire, MD Specialty: Internal Medicine Contact information: [place] [address]  [phone]   Delphina Cahill, MD. Specialty: Internal Medicine Contact information: [place] [address]  [phone]    [facility] (Dr. Maudie Mercury) Specialty: Family Medicine Contact information: [place] [address]  [phone]   Leslie Andrea, MD. Specialty: [facility] Medicine Contact information: [place] [address]  [phone]   Asencion Noble, MD. Specialty: Internal Medicine Contact information: [address]  [place] [address]  [place]  [address] [place], [place] [address] [phone]  Services [facility] offers a variety of basic health services.  Services include but are not limited to: Blood pressure checks  Heart rate checks  Blood sugar checks  Urine analysis  Rapid strep tests  Pregnancy tests.  Health education and referrals  People needing more complex services will be directed to a physician online. Using these virtual visits, doctors can evaluate and prescribe medicine and treatments. There will be no medication on-site, though [facility] will help patients fill their prescriptions at little to no cost.   For More information please go to: [email or web address]

## 2017-07-30 NOTE — ED Triage Notes (Signed)
Patient is on Eliquis

## 2017-07-30 NOTE — ED Provider Notes (Signed)
[facility] EMERGENCY DEPARTMENT Provider Note   CSN: [id number] Arrival date & time: 07/30/17  1850     History   Chief Complaint Chief Complaint  Patient presents with  . Chest Pain    HPI Angela House is a 82 y.o. female.  HPI Pleasant 82 year old female with a known history of pulmonary embolism which was found in September 2018.  Findings of right heart strain with bilateral acute pulmonary emboli were found at that time.  Taking Eliquis religiously as prescribed however several days ago developed sharp and stabbing pain in the left chest underneath the left breast, this was not positional exertional or related to deep breathing.  She has had some intermittent shortness of breath with this and now feels a mid chest discomfort.  She has mild swelling of the right lower extremity which seems to be chronic.  She does not have any coughing but has been clearing her throat frequently despite having no phlegm, no runny nose, no headache, no fevers or chills.  She does have some chronic swelling of the right lower extremity.  She denies having any prior cardiac history, she has had a normal stress test and heart catheterization in the distant past but cannot remember when that was.  She denies any tobacco use  Past Medical History:  Diagnosis Date  . Arthritis   . Hypertension   . PE (pulmonary thromboembolism) ([place])    bilaterally, 03/2017  . Restless leg syndrome     Patient Active Problem List   Diagnosis Date Noted  . LUQ pain 03/26/2017  . Restless leg syndrome 03/05/2017  . Borderline prolonged QT interval 03/05/2017  . Bilateral pulmonary embolism ([place]) 03/04/2017  . Arthritis 03/04/2017  . Hypertension 03/04/2017  . GERD (gastroesophageal reflux disease) 11/17/2015    Past Surgical History:  Procedure Laterality Date  . ABDOMINAL HYSTERECTOMY    . CHOLECYSTECTOMY    . EYE SURGERY    . KNEE SURGERY    . WRIST SURGERY      OB History    Gravida Para Term  Preterm AB Living             0   SAB TAB Ectopic Multiple Live Births                   Home Medications    Prior to Admission medications   Medication Sig Start Date End Date Taking? Authorizing Provider  acetaminophen (TYLENOL) 500 MG tablet Take 500 mg by mouth daily as needed for pain.    Yes [provider]  apixaban (ELIQUIS) 5 MG TABS tablet Take 10mg  po bid for 7 days then 5mg  po bid Patient taking differently: Take 5 mg by mouth 2 (two) times daily.  03/06/17  Yes Kathie Dike, MD  ASPERCREME W/LIDOCAINE EX Apply 1 application topically daily as needed (FOR HIP/KNEE PAIN).    Yes [provider]  calcium carbonate (OS-CAL - DOSED IN MG OF ELEMENTAL CALCIUM) 1250 (500 Ca) MG tablet Take 1 tablet by mouth 2 (two) times daily.    Yes [provider]  Carboxymethylcellulose Sodium (THERATEARS) 0.25 % SOLN Apply 1 drop to eye daily.   Yes [provider]  clobetasol (TEMOVATE) 0.05 % external solution Apply 1 application topically daily. Applied to scalp 06/11/17  Yes [provider]  clobetasol cream (TEMOVATE) 2.77 % Apply 1 application topically daily as needed (FOR RASH).  12/13/15  Yes [provider]  cycloSPORINE (RESTASIS) 0.05 % ophthalmic emulsion  Place 1 drop into both eyes 2 (two) times daily.   Yes [provider]  diazepam (VALIUM) 2 MG tablet Take 2 mg by mouth every 6 (six) hours as needed for anxiety.   Yes [provider]  fluticasone (FLONASE) 50 MCG/ACT nasal spray Place 2 sprays into the nose daily as needed for allergies or rhinitis.    Yes [provider]  gabapentin (NEURONTIN) 100 MG capsule Take 100 mg by mouth 2 (two) times daily.    Yes [provider]  Multiple Vitamin (MULTIVITAMIN) capsule Take 1 capsule by mouth every morning.    Yes [provider]  pantoprazole (PROTONIX) 20 MG tablet Take 20 mg by mouth daily.  07/16/17  Yes [provider]    pantoprazole (PROTONIX) 40 MG tablet TAKE 1 TABLET BY MOUTH ONCE DAILY 06/13/17  Yes Annitta Needs, NP  Potassium 99 MG TABS Take 1 tablet by mouth every other day.    Yes [provider]  VENTOLIN HFA 108 (90 Base) MCG/ACT inhaler Inhale 1-2 puffs into the lungs every 6 (six) hours as needed.  03/24/17  Yes [provider]  cetirizine (ZYRTEC) 10 MG tablet Take 10 mg by mouth daily as needed.     [provider]  ibuprofen (ADVIL,MOTRIN) 400 MG tablet Take 1 tablet (400 mg total) by mouth every 6 (six) hours as needed. 07/30/17   Noemi Chapel, MD  pantoprazole (PROTONIX) 40 MG tablet Take 1 tablet (40 mg total) by mouth daily. Patient not taking: Reported on 07/30/2017 06/10/17   Carlis Stable, NP  sulfamethoxazole-trimethoprim (BACTRIM DS,SEPTRA DS) 800-160 MG tablet Take 1 tablet by mouth 2 (two) times daily. 07/15/17   [provider]    Family History Family History  Problem Relation Age of Onset  . Arthritis Mother   . Hypertension Mother   . Hypertension Sister   . Pneumonia Brother   . Hypertension Sister   . Kidney disease Sister   . Colon cancer Neg Hx     Social History Social History   Tobacco Use  . Smoking status: Never Smoker  . Smokeless tobacco: Never Used  Substance Use Topics  . Alcohol use: No    Alcohol/week: 0.0 oz  . Drug use: No     Allergies   Ciprofloxacin and Meclizine   Review of Systems Review of Systems  All other systems reviewed and are negative.    Physical Exam Updated Vital Signs BP (!) 142/68   Pulse 83   Temp 98.4 F (36.9 C) (Oral)   Resp 19   Ht 5\' 5"  (1.651 m)   Wt 89.8 kg (198 lb)   SpO2 99%   BMI 32.95 kg/m   Physical Exam  Constitutional: She appears well-developed and well-nourished. No distress.  HENT:  Head: Normocephalic and atraumatic.  Mouth/Throat: Oropharynx is clear and moist. No oropharyngeal exudate.  Eyes: Conjunctivae and EOM are normal. Pupils are equal, round, and  reactive to light. Right eye exhibits no discharge. Left eye exhibits no discharge. No scleral icterus.  Neck: Normal range of motion. Neck supple. No JVD present. No thyromegaly present.  Cardiovascular: Normal rate, regular rhythm, normal heart sounds and intact distal pulses. Exam reveals no gallop and no friction rub.  No murmur heard. Pulmonary/Chest: Effort normal and breath sounds normal. No respiratory distress. She has no wheezes. She has no rales.  Abdominal: Soft. Bowel sounds are normal. She exhibits no distension and no mass. There is no tenderness.  Musculoskeletal: Normal range of motion. She exhibits no edema or tenderness.  Lymphadenopathy:    She has no cervical adenopathy.  Neurological: She is alert. Coordination normal.  Skin: Skin is warm and dry. No rash noted. No erythema.  Psychiatric: She has a normal mood and affect. Her behavior is normal.  Nursing note and vitals reviewed.    ED Treatments / Results  Labs (all labs ordered are listed, but only abnormal results are displayed) Labs Reviewed  CBC - Abnormal; Notable for the following components:      Result Value   RDW 16.4 (*)    All other components within normal limits  BASIC METABOLIC PANEL  I-STAT TROPONIN, ED    ED ECG REPORT  I personally interpreted this EKG   Date: 07/30/2017   Rate: 77  Rhythm: normal sinus rhythm  QRS Axis: indeterminate  Intervals: normal  ST/T Wave abnormalities: nonspecific T wave changes  Conduction Disutrbances:none  Narrative Interpretation:   Old EKG Reviewed: compared wtih prior - QT shorter today and Q waves are persistent  Radiology Dg Chest 2 View  Result Date: 07/30/2017 CLINICAL DATA:  Lower chest pain since Saturday EXAM: CHEST  2 VIEW COMPARISON:  March 23, 2017 FINDINGS: The heart size and mediastinal contours are within normal limits. There is no focal infiltrate, pulmonary edema, or pleural effusion. The visualized skeletal structures are  unremarkable. IMPRESSION: No active cardiopulmonary disease. Electronically Signed   By: Abelardo Diesel M.D.   On: 07/30/2017 19:29   Ct Angio Chest Pe W And/or Wo Contrast  Result Date: 07/30/2017 CLINICAL DATA:  Patient reports left side chest pain that started on Saturday moved to mid chest today. Patient states she has had increased pain today; hx bilateral PE EXAM: CT ANGIOGRAPHY CHEST WITH CONTRAST TECHNIQUE: Multidetector CT imaging of the chest was performed using the standard protocol during bolus administration of intravenous contrast. Multiplanar CT image reconstructions and MIPs were obtained to evaluate the vascular anatomy. CONTRAST:  143mL ISOVUE-370 IOPAMIDOL (ISOVUE-370) INJECTION 76% COMPARISON:  Current chest radiograph.  Chest CTA, 03/04/2017. FINDINGS: Cardiovascular: Satisfactory opacification of the pulmonary arteries to the segmental level. There is no convincing acute pulmonary embolism. There is a subtle nonocclusive filling defect in the right lower lobe segmental branch in an area where there was significant thrombus on the prior CT. This is likely minimal chronic pulmonary embolus. No other convincing chronic pulmonary emboli. Heart is normal in size and configuration. No pericardial effusion. No coronary artery calcifications. Thoracic aorta is normal caliber. No dissection. No significant atherosclerosis. Mediastinum/Nodes: No neck base or axillary masses or enlarged lymph nodes. No mediastinal or hilar masses or adenopathy. Esophagus is mildly distended. Small hiatal hernia. Trachea is widely patent. Lungs/Pleura: Mild linear opacities associated with minor bronchiectasis in the posteromedial lower lobes, similar to the prior CT. No evidence of pneumonia. No pulmonary edema. No lung mass or nodule. No pleural effusion or pneumothorax. Upper Abdomen: No acute abnormality. Musculoskeletal: No fracture or acute finding. Mild dextroscoliosis of the midthoracic spine. Disc degenerative  changes noted along the thoracic spine. No osteoblastic or osteolytic lesions. Review of the MIP images confirms the above findings. IMPRESSION: 1. No acute pulmonary embolus. No acute abnormalities on the chest CTA. 2. Small residual, chronic pulmonary embolus in a segmental branch of the right lower lobe. No other evidence of residual/clinic pulmonary emboli from the previous CTA. 3. Mild posteromedial lower lobe scarring/subsegmental atelectasis associated with minor bronchiectasis, stable from the prior CT. No evidence of pneumonia  or pulmonary edema. Electronically Signed   By: Lajean Manes M.D.   On: 07/30/2017 21:17    Procedures Procedures (including critical care time)  Medications Ordered in ED Medications  ketorolac (TORADOL) 30 MG/ML injection 15 mg (not administered)  iopamidol (ISOVUE-370) 76 % injection 100 mL (100 mLs Intravenous Contrast Given 07/30/17 2056)     Initial Impression / Assessment and Plan / ED Course  I have reviewed the triage vital signs and the nursing notes.  Pertinent labs & imaging results that were available during my care of the patient were reviewed by me and considered in my medical decision making (see chart for details).  Clinical Course as of Jul 30 2134  Tue Jul 30, 2017  2003 X-ray has been reviewed and interpreted by myself, 2 view PA and lateral chest x-ray which shows no acute infiltrates, no pneumothorax, normal mediastinum, no subdiaphragmatic air.  [BM]    Clinical Course User Index [BM] Noemi Chapel, MD    At this time the patient has findings that could be consistent with recurrent PE though that would be unlikely given her anticoagulated status.  That being said with sharp and stabbing pain it raises some concern.  X-ray is negative, labs have been drawn, may need a CT scan of labs are negative.  Her symptoms have been going on for several days thus making an acute coronary syndrome less likely.  On repeat exam the patient is  improved, she has no sharp chest pain, she still has this feeling of "stuttering" in her chest which is clearing her throat.  There is no signs of infiltrate on the x-ray, CT scan reviewed and showed that she has a resolving chronic PE and a very isolated subsegmental area.  The patient was informed of all of her results, she is stable for discharge, given some Toradol prior to discharge for a type of sharp pain in her chest.  Given the instructions for return and expressed her understanding and agreement with the plan  Final Clinical Impressions(s) / ED Diagnoses   Final diagnoses:  Left sided chest pain    ED Discharge Orders        Ordered    ibuprofen (ADVIL,MOTRIN) 400 MG tablet  Every 6 hours PRN     07/30/17 2135       Noemi Chapel, MD 07/30/17 2136

## 2017-08-12 DIAGNOSIS — N183 Chronic kidney disease, stage 3 (moderate): Secondary | ICD-10-CM | POA: Diagnosis not present

## 2017-08-12 DIAGNOSIS — Z789 Other specified health status: Secondary | ICD-10-CM | POA: Diagnosis not present

## 2017-08-12 DIAGNOSIS — Z6832 Body mass index (BMI) 32.0-32.9, adult: Secondary | ICD-10-CM | POA: Diagnosis not present

## 2017-08-12 DIAGNOSIS — I10 Essential (primary) hypertension: Secondary | ICD-10-CM | POA: Diagnosis not present

## 2017-08-12 DIAGNOSIS — Z299 Encounter for prophylactic measures, unspecified: Secondary | ICD-10-CM | POA: Diagnosis not present

## 2017-08-12 DIAGNOSIS — I2699 Other pulmonary embolism without acute cor pulmonale: Secondary | ICD-10-CM | POA: Diagnosis not present

## 2017-08-12 DIAGNOSIS — R609 Edema, unspecified: Secondary | ICD-10-CM | POA: Diagnosis not present

## 2017-08-20 ENCOUNTER — Ambulatory Visit: Payer: Medicare Other | Admitting: Family Medicine

## 2017-08-22 ENCOUNTER — Encounter: Payer: Self-pay | Admitting: Orthopaedic Surgery

## 2017-08-22 ENCOUNTER — Ambulatory Visit (INDEPENDENT_AMBULATORY_CARE_PROVIDER_SITE_OTHER): Payer: Medicare Other | Admitting: Orthopaedic Surgery

## 2017-08-22 VITALS — BP 138/75 | HR 67 | Temp 97.7°F | Ht 65.0 in | Wt 196.0 lb

## 2017-08-22 DIAGNOSIS — M5442 Lumbago with sciatica, left side: Secondary | ICD-10-CM | POA: Diagnosis not present

## 2017-08-22 DIAGNOSIS — M25552 Pain in left hip: Secondary | ICD-10-CM | POA: Diagnosis not present

## 2017-08-22 DIAGNOSIS — G2581 Restless legs syndrome: Secondary | ICD-10-CM | POA: Diagnosis not present

## 2017-08-22 DIAGNOSIS — G8929 Other chronic pain: Secondary | ICD-10-CM

## 2017-08-22 NOTE — Progress Notes (Signed)
Patient Angela House, female DOB:15-Nov-1935, 82 y.o. QBH:[id number]  Chief Complaint  Patient presents with  . Hip Pain    left    HPI  Angela House is a 82 y.o. female who has lower back pain and restless leg pain.She has good and bad days.  She is on Neurontin and doing well with it.She wants to do exercises and I have [facility] and [facility].  She will look into it. HPI  Body mass index is 32.62 kg/m.  ROS  Review of Systems  HENT: Negative for congestion.   Respiratory: Negative for cough and shortness of breath.   Cardiovascular: Negative for chest pain and leg swelling.  Gastrointestinal: Positive for abdominal pain.  Endocrine: Positive for cold intolerance.  Musculoskeletal: Positive for arthralgias, back pain and gait problem.  Allergic/Immunologic: Positive for environmental allergies.  All other systems reviewed and are negative.   Past Medical History:  Diagnosis Date  . Arthritis   . Hypertension   . PE (pulmonary thromboembolism) ([place])    bilaterally, 03/2017  . Restless leg syndrome     Past Surgical History:  Procedure Laterality Date  . ABDOMINAL HYSTERECTOMY    . CHOLECYSTECTOMY    . EYE SURGERY    . KNEE SURGERY    . WRIST SURGERY      Family History  Problem Relation Age of Onset  . Arthritis Mother   . Hypertension Mother   . Hypertension Sister   . Pneumonia Brother   . Hypertension Sister   . Kidney disease Sister   . Colon cancer Neg Hx     Social History Social History   Tobacco Use  . Smoking status: Never Smoker  . Smokeless tobacco: Never Used  Substance Use Topics  . Alcohol use: No    Alcohol/week: 0.0 oz  . Drug use: No    Allergies  Allergen Reactions  . Ciprofloxacin Hives  . Meclizine Other (See Comments)    dizziness    Current Outpatient Medications  Medication Sig Dispense Refill  . acetaminophen (TYLENOL) 500 MG tablet Take 500 mg by mouth daily as needed for  pain.     Marland Kitchen apixaban (ELIQUIS) 5 MG TABS tablet Take 10mg  po bid for 7 days then 5mg  po bid (Patient taking differently: Take 5 mg by mouth 2 (two) times daily. ) 60 tablet 1  . ASPERCREME W/LIDOCAINE EX Apply 1 application topically daily as needed (FOR HIP/KNEE PAIN).     Marland Kitchen calcium carbonate (OS-CAL - DOSED IN MG OF ELEMENTAL CALCIUM) 1250 (500 Ca) MG tablet Take 1 tablet by mouth 2 (two) times daily.     . Carboxymethylcellulose Sodium (THERATEARS) 0.25 % SOLN Apply 1 drop to eye daily.    . cetirizine (ZYRTEC) 10 MG tablet Take 10 mg by mouth daily as needed.     . clobetasol (TEMOVATE) 0.05 % external solution Apply 1 application topically daily. Applied to scalp    . clobetasol cream (TEMOVATE) 0.97 % Apply 1 application topically daily as needed (FOR RASH).     Marland Kitchen cycloSPORINE (RESTASIS) 0.05 % ophthalmic emulsion Place 1 drop into both eyes 2 (two) times daily.    . diazepam (VALIUM) 2 MG tablet Take 2 mg by mouth every 6 (six) hours as needed for anxiety.    . fluticasone (FLONASE) 50 MCG/ACT nasal spray Place 2 sprays into the nose daily as needed for allergies or rhinitis.     Marland Kitchen gabapentin (  NEURONTIN) 100 MG capsule Take 100 mg by mouth 2 (two) times daily.     Marland Kitchen ibuprofen (ADVIL,MOTRIN) 400 MG tablet Take 1 tablet (400 mg total) by mouth every 6 (six) hours as needed. 30 tablet 0  . Multiple Vitamin (MULTIVITAMIN) capsule Take 1 capsule by mouth every morning.     . pantoprazole (PROTONIX) 20 MG tablet Take 20 mg by mouth daily.     . pantoprazole (PROTONIX) 40 MG tablet Take 1 tablet (40 mg total) by mouth daily. (Patient not taking: Reported on 07/30/2017) 30 tablet 3  . pantoprazole (PROTONIX) 40 MG tablet TAKE 1 TABLET BY MOUTH ONCE DAILY 90 tablet 3  . Potassium 99 MG TABS Take 1 tablet by mouth every other day.     . sulfamethoxazole-trimethoprim (BACTRIM DS,SEPTRA DS) 800-160 MG tablet Take 1 tablet by mouth 2 (two) times daily.    . VENTOLIN HFA 108 (90 Base) MCG/ACT inhaler  Inhale 1-2 puffs into the lungs every 6 (six) hours as needed.      No current facility-administered medications for this visit.      Physical Exam  Blood pressure 138/75, pulse 67, temperature 97.7 F (36.5 C), height 5\' 5"  (1.651 m), weight 196 lb (88.9 kg).  Constitutional: overall normal hygiene, normal nutrition, well developed, normal grooming, normal body habitus. Assistive device:none  Musculoskeletal: gait and station Limp none, muscle tone and strength are normal, no tremors or atrophy is present.  .  Neurological: coordination overall normal.  Deep tendon reflex/nerve stretch intact.  Sensation normal slightly decreased of the feet bilaterally consistent with her neuropathy.  Cranial nerves II-XII intact.   Skin:   Normal overall no scars, lesions, ulcers or rashes. No psoriasis.  Psychiatric: Alert and oriented x 3.  Recent memory intact, remote memory unclear.  Normal mood and affect. Well groomed.  Good eye contact.  Cardiovascular: overall no swelling, no varicosities, no edema bilaterally, normal temperatures of the legs and arms, no clubbing, cyanosis and good capillary refill.  Lymphatic: palpation is normal.  Spine/Pelvis examination:  Inspection:  Overall, sacoiliac joint benign and hips nontender; without crepitus or defects.   Thoracic spine inspection: Alignment normal without kyphosis present   Lumbar spine inspection:  Alignment  with normal lumbar lordosis, without scoliosis apparent.   Thoracic spine palpation:  without tenderness of spinal processes   Lumbar spine palpation: without tenderness of lumbar area; without tightness of lumbar muscles    Range of Motion:   Lumbar flexion, forward flexion is normal without pain or tenderness    Lumbar extension is full without pain or tenderness   Left lateral bend is normal without pain or tenderness   Right lateral bend is normal without pain or tenderness   Straight leg raising is normal  Strength &  tone: normal   Stability overall normal stability  All other systems reviewed and are negative   The patient has been educated about the nature of the problem(s) and counseled on treatment options.  The patient appeared to understand what I have discussed and is in agreement with it.  Encounter Diagnoses  Name Primary?  . Chronic left-sided low back pain with left-sided sciatica Yes  . Left hip pain   . Restless leg syndrome     PLAN Call if any problems.  Precautions discussed.  Continue current medications.   Return to clinic 3 months   Electronically Signed Sanjuana Kava, MD 2/21/20191:48 PM

## 2017-08-27 DIAGNOSIS — Z299 Encounter for prophylactic measures, unspecified: Secondary | ICD-10-CM | POA: Diagnosis not present

## 2017-08-27 DIAGNOSIS — I2699 Other pulmonary embolism without acute cor pulmonale: Secondary | ICD-10-CM | POA: Diagnosis not present

## 2017-08-27 DIAGNOSIS — R600 Localized edema: Secondary | ICD-10-CM | POA: Diagnosis not present

## 2017-08-27 DIAGNOSIS — I739 Peripheral vascular disease, unspecified: Secondary | ICD-10-CM | POA: Diagnosis not present

## 2017-08-27 DIAGNOSIS — Z789 Other specified health status: Secondary | ICD-10-CM | POA: Diagnosis not present

## 2017-08-27 DIAGNOSIS — Z6832 Body mass index (BMI) 32.0-32.9, adult: Secondary | ICD-10-CM | POA: Diagnosis not present

## 2017-08-27 DIAGNOSIS — I10 Essential (primary) hypertension: Secondary | ICD-10-CM | POA: Diagnosis not present

## 2017-09-02 DIAGNOSIS — H10523 Angular blepharoconjunctivitis, bilateral: Secondary | ICD-10-CM | POA: Diagnosis not present

## 2017-09-02 DIAGNOSIS — Z961 Presence of intraocular lens: Secondary | ICD-10-CM | POA: Diagnosis not present

## 2017-09-10 DIAGNOSIS — Z299 Encounter for prophylactic measures, unspecified: Secondary | ICD-10-CM | POA: Diagnosis not present

## 2017-09-10 DIAGNOSIS — F419 Anxiety disorder, unspecified: Secondary | ICD-10-CM | POA: Diagnosis not present

## 2017-09-10 DIAGNOSIS — I10 Essential (primary) hypertension: Secondary | ICD-10-CM | POA: Diagnosis not present

## 2017-09-10 DIAGNOSIS — I739 Peripheral vascular disease, unspecified: Secondary | ICD-10-CM | POA: Diagnosis not present

## 2017-09-10 DIAGNOSIS — Z789 Other specified health status: Secondary | ICD-10-CM | POA: Diagnosis not present

## 2017-09-10 DIAGNOSIS — Z6832 Body mass index (BMI) 32.0-32.9, adult: Secondary | ICD-10-CM | POA: Diagnosis not present

## 2017-09-10 DIAGNOSIS — I2699 Other pulmonary embolism without acute cor pulmonale: Secondary | ICD-10-CM | POA: Diagnosis not present

## 2017-09-10 DIAGNOSIS — N183 Chronic kidney disease, stage 3 (moderate): Secondary | ICD-10-CM | POA: Diagnosis not present

## 2017-09-10 DIAGNOSIS — R609 Edema, unspecified: Secondary | ICD-10-CM | POA: Diagnosis not present

## 2017-09-10 DIAGNOSIS — H5789 Other specified disorders of eye and adnexa: Secondary | ICD-10-CM | POA: Diagnosis not present

## 2017-09-16 DIAGNOSIS — Z7901 Long term (current) use of anticoagulants: Secondary | ICD-10-CM | POA: Diagnosis not present

## 2017-09-16 DIAGNOSIS — I2699 Other pulmonary embolism without acute cor pulmonale: Secondary | ICD-10-CM | POA: Diagnosis not present

## 2017-09-27 DIAGNOSIS — Z299 Encounter for prophylactic measures, unspecified: Secondary | ICD-10-CM | POA: Diagnosis not present

## 2017-09-27 DIAGNOSIS — I739 Peripheral vascular disease, unspecified: Secondary | ICD-10-CM | POA: Diagnosis not present

## 2017-09-27 DIAGNOSIS — I2699 Other pulmonary embolism without acute cor pulmonale: Secondary | ICD-10-CM | POA: Diagnosis not present

## 2017-09-27 DIAGNOSIS — H04123 Dry eye syndrome of bilateral lacrimal glands: Secondary | ICD-10-CM | POA: Diagnosis not present

## 2017-09-27 DIAGNOSIS — I10 Essential (primary) hypertension: Secondary | ICD-10-CM | POA: Diagnosis not present

## 2017-09-27 DIAGNOSIS — Z6832 Body mass index (BMI) 32.0-32.9, adult: Secondary | ICD-10-CM | POA: Diagnosis not present

## 2017-10-03 DIAGNOSIS — Z961 Presence of intraocular lens: Secondary | ICD-10-CM | POA: Diagnosis not present

## 2017-10-03 DIAGNOSIS — D3130 Benign neoplasm of unspecified choroid: Secondary | ICD-10-CM | POA: Diagnosis not present

## 2017-10-03 DIAGNOSIS — H04123 Dry eye syndrome of bilateral lacrimal glands: Secondary | ICD-10-CM | POA: Diagnosis not present

## 2017-10-03 DIAGNOSIS — H10523 Angular blepharoconjunctivitis, bilateral: Secondary | ICD-10-CM | POA: Diagnosis not present

## 2017-10-08 ENCOUNTER — Ambulatory Visit: Payer: Medicare Other | Admitting: Family Medicine

## 2017-10-22 DIAGNOSIS — Z85828 Personal history of other malignant neoplasm of skin: Secondary | ICD-10-CM | POA: Diagnosis not present

## 2017-10-22 DIAGNOSIS — L280 Lichen simplex chronicus: Secondary | ICD-10-CM | POA: Diagnosis not present

## 2017-11-21 ENCOUNTER — Encounter: Payer: Self-pay | Admitting: Orthopaedic Surgery

## 2017-11-21 ENCOUNTER — Ambulatory Visit (INDEPENDENT_AMBULATORY_CARE_PROVIDER_SITE_OTHER): Payer: Medicare Other | Admitting: Orthopaedic Surgery

## 2017-11-21 VITALS — BP 143/70 | HR 83 | Temp 98.4°F | Ht 65.0 in | Wt 192.0 lb

## 2017-11-21 DIAGNOSIS — M7062 Trochanteric bursitis, left hip: Secondary | ICD-10-CM

## 2017-11-21 NOTE — Progress Notes (Signed)
PROCEDURE NOTE:  The patient request injection, verbal consent was obtained.  The left trochanteric area of the hip was prepped appropriately after time out was performed.   Sterile technique was observed and injection of 1 cc of Depo-Medrol 40 mg with several cc's of plain xylocaine. Anesthesia was provided by ethyl chloride and a 20-gauge needle was used to inject the hip area. The injection was tolerated well.  A band aid dressing was applied.  The patient was advised to apply ice later today and tomorrow to the injection sight as needed.  Return in three months.  Call if any problem.  Precautions discussed.   Electronically Signed Sanjuana Kava, MD 5/23/20192:13 PM

## 2017-12-06 ENCOUNTER — Encounter: Payer: Self-pay | Admitting: Internal Medicine

## 2017-12-14 ENCOUNTER — Emergency Department (HOSPITAL_COMMUNITY)
Admission: EM | Admit: 2017-12-14 | Discharge: 2017-12-14 | Disposition: A | Discharge status: Home / Self Care | Payer: Medicare Other | Attending: Emergency Medicine | Admitting: Emergency Medicine

## 2017-12-14 ENCOUNTER — Encounter (HOSPITAL_COMMUNITY): Payer: Self-pay | Admitting: Cardiology

## 2017-12-14 DIAGNOSIS — I10 Essential (primary) hypertension: Secondary | ICD-10-CM | POA: Insufficient documentation

## 2017-12-14 DIAGNOSIS — H5712 Ocular pain, left eye: Secondary | ICD-10-CM | POA: Diagnosis present

## 2017-12-14 DIAGNOSIS — Z79899 Other long term (current) drug therapy: Secondary | ICD-10-CM | POA: Insufficient documentation

## 2017-12-14 DIAGNOSIS — R0602 Shortness of breath: Secondary | ICD-10-CM | POA: Diagnosis not present

## 2017-12-14 DIAGNOSIS — H1032 Unspecified acute conjunctivitis, left eye: Secondary | ICD-10-CM | POA: Insufficient documentation

## 2017-12-14 MED ORDER — TOBRAMYCIN 0.3 % OP SOLN: 2.0000 [drp] | Freq: Four times a day (QID) | OPHTHALMIC | 0 refills | Status: DC

## 2017-12-14 NOTE — ED Provider Notes (Signed)
[facility] EMERGENCY DEPARTMENT Provider Note   CSN: [id number] Arrival date & time: 12/14/17  9678     History   Chief Complaint Chief Complaint  Patient presents with  . Eye Problem  . Shortness of Breath    HPI Angela House is a 82 y.o. female.  Left eye redness and discharge for 2 days.  Vision normal.  Patient also complains of intermittent dyspnea for unknown length of time.  No chest pain.  Symptoms are not associate with any activity.  She is currently not having any chest pain or dyspnea.  Severity of symptoms is mild.  Nothing makes symptoms better or worse     Past Medical History:  Diagnosis Date  . Arthritis   . Hypertension   . PE (pulmonary thromboembolism) ([place])    bilaterally, 03/2017  . Restless leg syndrome     Patient Active Problem List   Diagnosis Date Noted  . LUQ pain 03/26/2017  . Restless leg syndrome 03/05/2017  . Borderline prolonged QT interval 03/05/2017  . Bilateral pulmonary embolism ([place]) 03/04/2017  . Arthritis 03/04/2017  . Hypertension 03/04/2017  . GERD (gastroesophageal reflux disease) 11/17/2015    Past Surgical History:  Procedure Laterality Date  . ABDOMINAL HYSTERECTOMY    . CHOLECYSTECTOMY    . EYE SURGERY    . KNEE SURGERY    . WRIST SURGERY       OB History    Gravida      Para      Term      Preterm      AB      Living  0     SAB      TAB      Ectopic      Multiple      Live Births               Home Medications    Prior to Admission medications   Medication Sig Start Date End Date Taking? Authorizing Provider  acetaminophen (TYLENOL) 500 MG tablet Take 500 mg by mouth daily as needed for pain.     [provider]  apixaban (ELIQUIS) 5 MG TABS tablet Take 10mg  po bid for 7 days then 5mg  po bid Patient taking differently: Take 5 mg by mouth 2 (two) times daily.  03/06/17   Kathie Dike, MD  ASPERCREME W/LIDOCAINE EX Apply 1 application topically daily as needed (FOR  HIP/KNEE PAIN).     [provider]  calcium carbonate (OS-CAL - DOSED IN MG OF ELEMENTAL CALCIUM) 1250 (500 Ca) MG tablet Take 1 tablet by mouth 2 (two) times daily.     [provider]  Carboxymethylcellulose Sodium (THERATEARS) 0.25 % SOLN Apply 1 drop to eye daily.    [provider]  cetirizine (ZYRTEC) 10 MG tablet Take 10 mg by mouth daily as needed.     [provider]  clobetasol (TEMOVATE) 0.05 % external solution Apply 1 application topically daily. Applied to scalp 06/11/17   [provider]  clobetasol cream (TEMOVATE) 9.38 % Apply 1 application topically daily as needed (FOR RASH).  12/13/15   [provider]  cycloSPORINE (RESTASIS) 0.05 % ophthalmic emulsion Place 1 drop into both eyes 2 (two) times daily.    [provider]  diazepam (VALIUM) 2 MG tablet Take 2 mg by mouth every 6 (six) hours as needed for anxiety.    [provider]  fluticasone (FLONASE) 50 MCG/ACT nasal spray Place 2 sprays  into the nose daily as needed for allergies or rhinitis.     [provider]  gabapentin (NEURONTIN) 100 MG capsule Take 100 mg by mouth 2 (two) times daily.     [provider]  ibuprofen (ADVIL,MOTRIN) 400 MG tablet Take 1 tablet (400 mg total) by mouth every 6 (six) hours as needed. 07/30/17   Noemi Chapel, MD  Multiple Vitamin (MULTIVITAMIN) capsule Take 1 capsule by mouth every morning.     [provider]  pantoprazole (PROTONIX) 20 MG tablet Take 20 mg by mouth daily.  07/16/17   [provider]  pantoprazole (PROTONIX) 40 MG tablet Take 1 tablet (40 mg total) by mouth daily. Patient not taking: Reported on 07/30/2017 06/10/17   Carlis Stable, NP  pantoprazole (PROTONIX) 40 MG tablet TAKE 1 TABLET BY MOUTH ONCE DAILY 06/13/17   Annitta Needs, NP  Potassium 99 MG TABS Take 1 tablet by mouth every other day.     [provider]  sulfamethoxazole-trimethoprim (BACTRIM DS,SEPTRA  DS) 800-160 MG tablet Take 1 tablet by mouth 2 (two) times daily. 07/15/17   [provider]  tobramycin (TOBREX) 0.3 % ophthalmic solution Place 2 drops into the left eye 4 (four) times daily. 12/14/17   Nat Christen, MD  VENTOLIN HFA 108 [id number] Base) MCG/ACT inhaler Inhale 1-2 puffs into the lungs every 6 (six) hours as needed.  03/24/17   [provider]    Family History Family History  Problem Relation Age of Onset  . Arthritis Mother   . Hypertension Mother   . Hypertension Sister   . Pneumonia Brother   . Hypertension Sister   . Kidney disease Sister   . Colon cancer Neg Hx     Social History Social History   Tobacco Use  . Smoking status: Never Smoker  . Smokeless tobacco: Never Used  Substance Use Topics  . Alcohol use: No    Alcohol/week: 0.0 oz  . Drug use: No     Allergies   Ciprofloxacin and Meclizine   Review of Systems Review of Systems  All other systems reviewed and are negative.    Physical Exam Updated Vital Signs BP 122/71   Pulse 63   Temp 97.8 F (36.6 C) (Oral)   Resp (!) 21   Ht 5\' 5"  (1.651 m)   Wt 85.7 kg (189 lb)   SpO2 93%   BMI 31.45 kg/m   Physical Exam  Constitutional: She is oriented to person, place, and time. She appears well-developed and well-nourished.  HENT:  Head: Normocephalic and atraumatic.  Left eye: Conjunctiva injected with minimal clear discharge.  Eyes: Conjunctivae are normal.  Neck: Neck supple.  Cardiovascular: Normal rate and regular rhythm.  Pulmonary/Chest: Effort normal and breath sounds normal.  Abdominal: Soft. Bowel sounds are normal.  Musculoskeletal: Normal range of motion.  Neurological: She is alert and oriented to person, place, and time.  Skin: Skin is warm and dry.  Psychiatric: She has a normal mood and affect. Her behavior is normal.  Nursing note and vitals reviewed.    ED Treatments / Results  Labs (all labs ordered are listed, but only abnormal results are  displayed) Labs Reviewed - No data to display  EKG EKG Interpretation  Date/Time:  Saturday December 14 2017 09:27:02 EDT Ventricular Rate:  71 PR Interval:    QRS Duration: 92 QT Interval:  379 QTC Calculation: 412 R Axis:   -140 Text Interpretation:  Sinus rhythm S1,S2,S3 pattern Borderline T  abnormalities, diffuse leads Confirmed by Nat Christen [id number]) on 12/14/2017 10:01:29 AM   Radiology No results found.  Procedures Procedures (including critical care time)  Medications Ordered in ED Medications - No data to display   Initial Impression / Assessment and Plan / ED Course  I have reviewed the triage vital signs and the nursing notes.  Pertinent labs & imaging results that were available during my care of the patient were reviewed by me and considered in my medical decision making (see chart for details).   Patient presents with left eye redness and discharge consistent with conjunctivitis.  EKG shows no acute changes.  She opted to follow-up with her primary care doctor for her concerns of dyspnea.  She did not choose to have any further evaluation for this concern.    Final Clinical Impressions(s) / ED Diagnoses   Final diagnoses:  Acute conjunctivitis of left eye, unspecified acute conjunctivitis type    ED Discharge Orders        Ordered    tobramycin (TOBREX) 0.3 % ophthalmic solution  4 times daily     12/14/17 1004       Nat Christen, MD 12/14/17 1046

## 2017-12-14 NOTE — Discharge Instructions (Addendum)
Rinse your eyes with tap water.  Apply drops.  Good handwashing.  Follow-up your primary care doctor.

## 2017-12-14 NOTE — ED Triage Notes (Addendum)
C/o SOB  times one week.  SOB worse with exertion.  States she takes PRN lasix for leg swelling.  Also c/o left eye redness since Thursday.

## 2017-12-27 DIAGNOSIS — Z86711 Personal history of pulmonary embolism: Secondary | ICD-10-CM | POA: Diagnosis not present

## 2017-12-27 DIAGNOSIS — I10 Essential (primary) hypertension: Secondary | ICD-10-CM | POA: Diagnosis not present

## 2017-12-27 DIAGNOSIS — R0602 Shortness of breath: Secondary | ICD-10-CM | POA: Diagnosis not present

## 2017-12-27 DIAGNOSIS — I739 Peripheral vascular disease, unspecified: Secondary | ICD-10-CM | POA: Diagnosis not present

## 2017-12-27 DIAGNOSIS — N183 Chronic kidney disease, stage 3 (moderate): Secondary | ICD-10-CM | POA: Diagnosis not present

## 2017-12-27 DIAGNOSIS — Z6831 Body mass index (BMI) 31.0-31.9, adult: Secondary | ICD-10-CM | POA: Diagnosis not present

## 2017-12-27 DIAGNOSIS — Z299 Encounter for prophylactic measures, unspecified: Secondary | ICD-10-CM | POA: Diagnosis not present

## 2018-01-01 DIAGNOSIS — I2699 Other pulmonary embolism without acute cor pulmonale: Secondary | ICD-10-CM | POA: Diagnosis not present

## 2018-01-01 DIAGNOSIS — J984 Other disorders of lung: Secondary | ICD-10-CM | POA: Diagnosis not present

## 2018-01-09 DIAGNOSIS — Z6831 Body mass index (BMI) 31.0-31.9, adult: Secondary | ICD-10-CM | POA: Diagnosis not present

## 2018-01-09 DIAGNOSIS — R040 Epistaxis: Secondary | ICD-10-CM | POA: Diagnosis not present

## 2018-01-09 DIAGNOSIS — I10 Essential (primary) hypertension: Secondary | ICD-10-CM | POA: Diagnosis not present

## 2018-01-09 DIAGNOSIS — Z299 Encounter for prophylactic measures, unspecified: Secondary | ICD-10-CM | POA: Diagnosis not present

## 2018-01-09 DIAGNOSIS — I2699 Other pulmonary embolism without acute cor pulmonale: Secondary | ICD-10-CM | POA: Diagnosis not present

## 2018-01-21 DIAGNOSIS — I2699 Other pulmonary embolism without acute cor pulmonale: Secondary | ICD-10-CM | POA: Diagnosis not present

## 2018-01-21 DIAGNOSIS — I10 Essential (primary) hypertension: Secondary | ICD-10-CM | POA: Diagnosis not present

## 2018-01-21 DIAGNOSIS — K219 Gastro-esophageal reflux disease without esophagitis: Secondary | ICD-10-CM | POA: Diagnosis not present

## 2018-01-21 DIAGNOSIS — M1990 Unspecified osteoarthritis, unspecified site: Secondary | ICD-10-CM | POA: Diagnosis not present

## 2018-01-21 DIAGNOSIS — R1012 Left upper quadrant pain: Secondary | ICD-10-CM | POA: Diagnosis not present

## 2018-01-21 DIAGNOSIS — D759 Disease of blood and blood-forming organs, unspecified: Secondary | ICD-10-CM | POA: Diagnosis not present

## 2018-01-21 DIAGNOSIS — R9431 Abnormal electrocardiogram [ECG] [EKG]: Secondary | ICD-10-CM | POA: Diagnosis not present

## 2018-01-21 DIAGNOSIS — Z09 Encounter for follow-up examination after completed treatment for conditions other than malignant neoplasm: Secondary | ICD-10-CM | POA: Diagnosis not present

## 2018-01-27 DIAGNOSIS — Z01419 Encounter for gynecological examination (general) (routine) without abnormal findings: Secondary | ICD-10-CM | POA: Diagnosis not present

## 2018-01-27 DIAGNOSIS — Z1272 Encounter for screening for malignant neoplasm of vagina: Secondary | ICD-10-CM | POA: Diagnosis not present

## 2018-01-30 DIAGNOSIS — M1990 Unspecified osteoarthritis, unspecified site: Secondary | ICD-10-CM | POA: Diagnosis not present

## 2018-01-30 DIAGNOSIS — Z86711 Personal history of pulmonary embolism: Secondary | ICD-10-CM | POA: Diagnosis not present

## 2018-01-30 DIAGNOSIS — Z7901 Long term (current) use of anticoagulants: Secondary | ICD-10-CM | POA: Diagnosis not present

## 2018-01-30 DIAGNOSIS — R079 Chest pain, unspecified: Secondary | ICD-10-CM | POA: Diagnosis not present

## 2018-01-30 DIAGNOSIS — K219 Gastro-esophageal reflux disease without esophagitis: Secondary | ICD-10-CM | POA: Diagnosis not present

## 2018-01-30 DIAGNOSIS — E039 Hypothyroidism, unspecified: Secondary | ICD-10-CM | POA: Diagnosis not present

## 2018-01-30 DIAGNOSIS — I2510 Atherosclerotic heart disease of native coronary artery without angina pectoris: Secondary | ICD-10-CM | POA: Diagnosis not present

## 2018-01-30 DIAGNOSIS — I10 Essential (primary) hypertension: Secondary | ICD-10-CM | POA: Diagnosis not present

## 2018-01-30 DIAGNOSIS — Z79899 Other long term (current) drug therapy: Secondary | ICD-10-CM | POA: Diagnosis not present

## 2018-01-30 DIAGNOSIS — Z90710 Acquired absence of both cervix and uterus: Secondary | ICD-10-CM | POA: Diagnosis not present

## 2018-01-30 DIAGNOSIS — J45909 Unspecified asthma, uncomplicated: Secondary | ICD-10-CM | POA: Diagnosis not present

## 2018-01-30 DIAGNOSIS — Z888 Allergy status to other drugs, medicaments and biological substances status: Secondary | ICD-10-CM | POA: Diagnosis not present

## 2018-01-30 DIAGNOSIS — Z8249 Family history of ischemic heart disease and other diseases of the circulatory system: Secondary | ICD-10-CM | POA: Diagnosis not present

## 2018-01-30 DIAGNOSIS — M542 Cervicalgia: Secondary | ICD-10-CM | POA: Diagnosis not present

## 2018-01-30 DIAGNOSIS — R0789 Other chest pain: Secondary | ICD-10-CM | POA: Diagnosis not present

## 2018-01-30 DIAGNOSIS — Z9049 Acquired absence of other specified parts of digestive tract: Secondary | ICD-10-CM | POA: Diagnosis not present

## 2018-01-30 DIAGNOSIS — Z881 Allergy status to other antibiotic agents status: Secondary | ICD-10-CM | POA: Diagnosis not present

## 2018-01-31 DIAGNOSIS — Z7901 Long term (current) use of anticoagulants: Secondary | ICD-10-CM | POA: Diagnosis not present

## 2018-01-31 DIAGNOSIS — R079 Chest pain, unspecified: Secondary | ICD-10-CM | POA: Diagnosis not present

## 2018-01-31 DIAGNOSIS — M542 Cervicalgia: Secondary | ICD-10-CM | POA: Diagnosis not present

## 2018-01-31 DIAGNOSIS — Z86711 Personal history of pulmonary embolism: Secondary | ICD-10-CM | POA: Diagnosis not present

## 2018-02-04 DIAGNOSIS — R079 Chest pain, unspecified: Secondary | ICD-10-CM | POA: Diagnosis not present

## 2018-02-04 DIAGNOSIS — I10 Essential (primary) hypertension: Secondary | ICD-10-CM | POA: Diagnosis not present

## 2018-02-04 DIAGNOSIS — Z86711 Personal history of pulmonary embolism: Secondary | ICD-10-CM | POA: Diagnosis not present

## 2018-02-04 DIAGNOSIS — I2699 Other pulmonary embolism without acute cor pulmonale: Secondary | ICD-10-CM | POA: Diagnosis not present

## 2018-02-04 DIAGNOSIS — G2581 Restless legs syndrome: Secondary | ICD-10-CM | POA: Diagnosis not present

## 2018-02-04 DIAGNOSIS — R1012 Left upper quadrant pain: Secondary | ICD-10-CM | POA: Diagnosis not present

## 2018-02-04 DIAGNOSIS — Z7901 Long term (current) use of anticoagulants: Secondary | ICD-10-CM | POA: Diagnosis not present

## 2018-02-04 DIAGNOSIS — K219 Gastro-esophageal reflux disease without esophagitis: Secondary | ICD-10-CM | POA: Diagnosis not present

## 2018-02-04 DIAGNOSIS — M1990 Unspecified osteoarthritis, unspecified site: Secondary | ICD-10-CM | POA: Diagnosis not present

## 2018-02-04 DIAGNOSIS — Z79899 Other long term (current) drug therapy: Secondary | ICD-10-CM | POA: Diagnosis not present

## 2018-02-07 DIAGNOSIS — I10 Essential (primary) hypertension: Secondary | ICD-10-CM | POA: Diagnosis not present

## 2018-02-07 DIAGNOSIS — I739 Peripheral vascular disease, unspecified: Secondary | ICD-10-CM | POA: Diagnosis not present

## 2018-02-07 DIAGNOSIS — K219 Gastro-esophageal reflux disease without esophagitis: Secondary | ICD-10-CM | POA: Diagnosis not present

## 2018-02-07 DIAGNOSIS — Z299 Encounter for prophylactic measures, unspecified: Secondary | ICD-10-CM | POA: Diagnosis not present

## 2018-02-07 DIAGNOSIS — N183 Chronic kidney disease, stage 3 (moderate): Secondary | ICD-10-CM | POA: Diagnosis not present

## 2018-02-07 DIAGNOSIS — I2699 Other pulmonary embolism without acute cor pulmonale: Secondary | ICD-10-CM | POA: Diagnosis not present

## 2018-02-07 DIAGNOSIS — Z6831 Body mass index (BMI) 31.0-31.9, adult: Secondary | ICD-10-CM | POA: Diagnosis not present

## 2018-02-17 DIAGNOSIS — R102 Pelvic and perineal pain: Secondary | ICD-10-CM | POA: Diagnosis not present

## 2018-02-17 DIAGNOSIS — R1031 Right lower quadrant pain: Secondary | ICD-10-CM | POA: Diagnosis not present

## 2018-02-17 DIAGNOSIS — R109 Unspecified abdominal pain: Secondary | ICD-10-CM | POA: Diagnosis not present

## 2018-02-17 DIAGNOSIS — Z90710 Acquired absence of both cervix and uterus: Secondary | ICD-10-CM | POA: Diagnosis not present

## 2018-02-17 DIAGNOSIS — Z9049 Acquired absence of other specified parts of digestive tract: Secondary | ICD-10-CM | POA: Diagnosis not present

## 2018-02-19 ENCOUNTER — Ambulatory Visit (INDEPENDENT_AMBULATORY_CARE_PROVIDER_SITE_OTHER): Payer: Medicare Other | Admitting: Orthopaedic Surgery

## 2018-02-19 ENCOUNTER — Encounter: Payer: Self-pay | Admitting: Orthopaedic Surgery

## 2018-02-19 VITALS — BP 124/71 | HR 63 | Ht 64.0 in | Wt 187.0 lb

## 2018-02-19 DIAGNOSIS — M79672 Pain in left foot: Secondary | ICD-10-CM

## 2018-02-19 NOTE — Progress Notes (Signed)
Patient [name], female DOB:[birth year], 82 y.o. WHQ:[id number]  Chief Complaint  Patient presents with  . Hip Pain    left  . Toe Pain    left    HPI  Angela House is a 82 y.o. female who has left foot pain today.  She has pain on the medial side of the great toe and has developed a little swelling over the PIP joint dorsally of the second toe.  She has no trauma, no redness.  I looked at her foot and at her shoe.  She needs a wider shoe to wear and that should help her problem.  She has little room for the toes to move and the shoe is pushing causing pain.  She will get wider shoes.   Body mass index is 32.1 kg/m.  ROS  Review of Systems  HENT: Negative for congestion.   Respiratory: Negative for cough and shortness of breath.   Cardiovascular: Negative for chest pain and leg swelling.  Gastrointestinal: Positive for abdominal pain.  Endocrine: Positive for cold intolerance.  Musculoskeletal: Positive for arthralgias, back pain and gait problem.  Allergic/Immunologic: Positive for environmental allergies.  All other systems reviewed and are negative.   All other systems reviewed and are negative.  The following is a summary of the past history medically, past history surgically, known current medicines, social history and family history.  This information is gathered electronically by the computer from prior information and documentation.  I review this each visit and have found including this information at this point in the chart is beneficial and informative.    Past Medical History:  Diagnosis Date  . Arthritis   . Hypertension   . PE (pulmonary thromboembolism) ([place])    bilaterally, 03/2017  . Restless leg syndrome     Past Surgical History:  Procedure Laterality Date  . ABDOMINAL HYSTERECTOMY    . CHOLECYSTECTOMY    . EYE SURGERY    . KNEE SURGERY    . WRIST SURGERY      Family History  Problem Relation Age of Onset  . Arthritis Mother   .  Hypertension Mother   . Hypertension Sister   . Pneumonia Brother   . Hypertension Sister   . Kidney disease Sister   . Colon cancer Neg Hx     Social History Social History   Tobacco Use  . Smoking status: Never Smoker  . Smokeless tobacco: Never Used  Substance Use Topics  . Alcohol use: No    Alcohol/week: 0.0 standard drinks  . Drug use: No    Allergies  Allergen Reactions  . Ciprofloxacin Hives  . Meclizine Other (See Comments)    dizziness    Current Outpatient Medications  Medication Sig Dispense Refill  . acetaminophen (TYLENOL) 500 MG tablet Take 500 mg by mouth daily as needed for pain.     Marland Kitchen apixaban (ELIQUIS) 5 MG TABS tablet Take 10mg  po bid for 7 days then 5mg  po bid (Patient taking differently: Take 5 mg by mouth 2 (two) times daily. ) 60 tablet 1  . ASPERCREME W/LIDOCAINE EX Apply 1 application topically daily as needed (FOR HIP/KNEE PAIN).     Marland Kitchen calcium carbonate (OS-CAL - DOSED IN MG OF ELEMENTAL CALCIUM) 1250 (500 Ca) MG tablet Take 1 tablet by mouth 2 (two) times daily.     . Carboxymethylcellulose Sodium (THERATEARS) 0.25 % SOLN Apply 1 drop to eye daily.    . cetirizine (ZYRTEC) 10 MG tablet Take 10  mg by mouth daily as needed.     . clobetasol (TEMOVATE) 0.05 % external solution Apply 1 application topically daily. Applied to scalp    . clobetasol cream (TEMOVATE) 6.62 % Apply 1 application topically daily as needed (FOR RASH).     Marland Kitchen cycloSPORINE (RESTASIS) 0.05 % ophthalmic emulsion Place 1 drop into both eyes 2 (two) times daily.    . diazepam (VALIUM) 2 MG tablet Take 2 mg by mouth every 6 (six) hours as needed for anxiety.    . fluticasone (FLONASE) 50 MCG/ACT nasal spray Place 2 sprays into the nose daily as needed for allergies or rhinitis.     . furosemide (LASIX) 20 MG tablet Take by mouth.    . gabapentin (NEURONTIN) 100 MG capsule Take 100 mg by mouth 2 (two) times daily.     Marland Kitchen ibuprofen (ADVIL,MOTRIN) 400 MG tablet Take 1 tablet (400 mg  total) by mouth every 6 (six) hours as needed. 30 tablet 0  . Multiple Vitamin (MULTIVITAMIN) capsule Take 1 capsule by mouth every morning.     . Potassium 99 MG TABS Take 1 tablet by mouth every other day.     . sulfamethoxazole-trimethoprim (BACTRIM DS,SEPTRA DS) 800-160 MG tablet Take 1 tablet by mouth 2 (two) times daily.    Marland Kitchen tobramycin (TOBREX) 0.3 % ophthalmic solution Place 2 drops into the left eye 4 (four) times daily. 5 mL 0  . VENTOLIN HFA 108 (90 Base) MCG/ACT inhaler Inhale 1-2 puffs into the lungs every 6 (six) hours as needed.     . pantoprazole (PROTONIX) 40 MG tablet TAKE 1 TABLET BY MOUTH ONCE DAILY (Patient not taking: Reported on 02/19/2018) 90 tablet 3   No current facility-administered medications for this visit.      Physical Exam  Blood pressure 124/71, pulse 63, height 5\' 4"  (1.626 m), weight 187 lb (84.8 kg).  Constitutional: overall normal hygiene, normal nutrition, well developed, normal grooming, normal body habitus. Assistive device:none  Musculoskeletal: gait and station Limp none, muscle tone and strength are normal, no tremors or atrophy is present.  .  Neurological: coordination overall normal.  Deep tendon reflex/nerve stretch intact.  Sensation normal.  Cranial nerves II-XII intact.   Skin:   Normal overall no scars, lesions, ulcers or rashes. No psoriasis.  Psychiatric: Alert and oriented x 3.  Recent memory intact, remote memory unclear.  Normal mood and affect. Well groomed.  Good eye contact.  Cardiovascular: overall no swelling, no varicosities, no edema bilaterally, normal temperatures of the legs and arms, no clubbing, cyanosis and good capillary refill.  Lymphatic: palpation is normal.  She has tenderness of the great toe medially and of the first metatarsal medially of her left foot but no redness or swelling.  NV intact.  She has slight swelling over the dorsal PIP joint of the second toe.  All other systems reviewed and are negative    The patient has been educated about the nature of the problem(s) and counseled on treatment options.  The patient appeared to understand what I have discussed and is in agreement with it.  Encounter Diagnosis  Name Primary?  . Left foot pain Yes    PLAN Call if any problems.  Precautions discussed.  Continue current medications.   Return to clinic 3 months   She will get wider shoes.   Electronically Signed Sanjuana Kava, MD 8/21/20192:23 PM

## 2018-02-20 ENCOUNTER — Ambulatory Visit: Payer: Medicare Other | Admitting: Orthopaedic Surgery

## 2018-03-10 DIAGNOSIS — Z6831 Body mass index (BMI) 31.0-31.9, adult: Secondary | ICD-10-CM | POA: Diagnosis not present

## 2018-03-10 DIAGNOSIS — Z299 Encounter for prophylactic measures, unspecified: Secondary | ICD-10-CM | POA: Diagnosis not present

## 2018-03-10 DIAGNOSIS — M25562 Pain in left knee: Secondary | ICD-10-CM | POA: Diagnosis not present

## 2018-03-10 DIAGNOSIS — I10 Essential (primary) hypertension: Secondary | ICD-10-CM | POA: Diagnosis not present

## 2018-03-10 DIAGNOSIS — I2699 Other pulmonary embolism without acute cor pulmonale: Secondary | ICD-10-CM | POA: Diagnosis not present

## 2018-03-19 DIAGNOSIS — Z299 Encounter for prophylactic measures, unspecified: Secondary | ICD-10-CM | POA: Diagnosis not present

## 2018-03-19 DIAGNOSIS — I10 Essential (primary) hypertension: Secondary | ICD-10-CM | POA: Diagnosis not present

## 2018-03-19 DIAGNOSIS — J069 Acute upper respiratory infection, unspecified: Secondary | ICD-10-CM | POA: Diagnosis not present

## 2018-03-19 DIAGNOSIS — Z6831 Body mass index (BMI) 31.0-31.9, adult: Secondary | ICD-10-CM | POA: Diagnosis not present

## 2018-03-19 DIAGNOSIS — N183 Chronic kidney disease, stage 3 (moderate): Secondary | ICD-10-CM | POA: Diagnosis not present

## 2018-03-25 ENCOUNTER — Ambulatory Visit (INDEPENDENT_AMBULATORY_CARE_PROVIDER_SITE_OTHER): Payer: Medicare Other | Admitting: Internal Medicine

## 2018-03-25 ENCOUNTER — Encounter: Payer: Self-pay | Admitting: Internal Medicine

## 2018-03-25 ENCOUNTER — Other Ambulatory Visit: Payer: Self-pay

## 2018-03-25 VITALS — BP 139/77 | HR 64 | Temp 97.4°F | Ht 64.0 in | Wt 187.8 lb

## 2018-03-25 DIAGNOSIS — K219 Gastro-esophageal reflux disease without esophagitis: Secondary | ICD-10-CM | POA: Diagnosis not present

## 2018-03-25 MED ORDER — FAMOTIDINE 20 MG PO TABS: 20.0000 mg | ORAL_TABLET | Freq: Two times a day (BID) | ORAL | 1 refills | Status: DC

## 2018-03-25 NOTE — Progress Notes (Signed)
Primary Care Physician:  Monico Blitz, MD Primary Gastroenterologist:  Dr. Gala Romney  Pre-Procedure History & Physical: HPI:  Angela House is a 82 y.o. female here for follow-up of GERD.  Previously on Protonix.  She broke out in a rash and stopped Protonix.  Now on Pepcid 20 mg each morning.  Occasionally breakthrough symptoms in the evening.  No dysphagia; no melena or rectal bleeding.  She can anticipate getting reflux if she eats something she does not eat or eats too close to bedtime. 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  Apparently, her hypercoagulable work-up was negative.  Past Medical History:  Diagnosis Date  . Arthritis   . Hypertension   . PE (pulmonary thromboembolism) ([place])    bilaterally, 03/2017  . Restless leg syndrome     Past Surgical History:  Procedure Laterality Date  . ABDOMINAL HYSTERECTOMY    . CHOLECYSTECTOMY    . EYE SURGERY    . KNEE SURGERY    . WRIST SURGERY      Prior to Admission medications   Medication Sig Start Date End Date Taking? Authorizing Provider  acetaminophen (TYLENOL) 325 MG tablet Take 325 mg by mouth every 6 (six) hours as needed.   Yes [provider]  apixaban (ELIQUIS) 5 MG TABS tablet Take 10mg  po bid for 7 days then 5mg  po bid Patient taking differently: Take 5 mg by mouth 2 (two) times daily.  03/06/17  Yes Kathie Dike, MD  ASPERCREME W/LIDOCAINE EX Apply 1 application topically daily as needed (FOR HIP/KNEE PAIN).    Yes [provider]  calcium carbonate (OS-CAL - DOSED IN MG OF ELEMENTAL CALCIUM) 1250 (500 Ca) MG tablet Take 1 tablet by mouth 2 (two) times daily.    Yes [provider]  Carboxymethylcellulose Sodium (THERATEARS) 0.25 % SOLN Apply 1 drop to eye as needed.    Yes [provider]  cetirizine (ZYRTEC) 10 MG tablet Take 10 mg by mouth daily as needed.    Yes [provider]  clobetasol (TEMOVATE) 0.05 % external solution Apply  1 application topically daily. Applied to scalp 06/11/17  Yes [provider]  clobetasol cream (TEMOVATE) 2.59 % Apply 1 application topically daily as needed (FOR RASH).  12/13/15  Yes [provider]  cycloSPORINE (RESTASIS) 0.05 % ophthalmic emulsion Place 1 drop into both eyes 2 (two) times daily.   Yes [provider]  diazepam (VALIUM) 2 MG tablet Take 2 mg by mouth every 6 (six) hours as needed for anxiety.   Yes [provider]  famotidine (PEPCID) 20 MG tablet Take 20 mg by mouth daily. 03/14/18  Yes [provider]  fluticasone (FLONASE) 50 MCG/ACT nasal spray Place 2 sprays into the nose daily as needed for allergies or rhinitis.    Yes [provider]  furosemide (LASIX) 20 MG tablet Take by mouth as needed.    Yes [provider]  gabapentin (NEURONTIN) 100 MG capsule Take 100 mg by mouth 2 (two) times daily.    Yes [provider]  Multiple Vitamin (MULTIVITAMIN) capsule Take 1 capsule by mouth every morning.    Yes [provider]  Potassium 99 MG TABS Take 1 tablet by mouth every other day.    Yes [provider]  VENTOLIN HFA 108 (90 Base) MCG/ACT inhaler Inhale 1-2 puffs into the lungs every 6 (six) hours as needed.  03/24/17  Yes [provider]  acetaminophen (TYLENOL) 500  MG tablet Take 500 mg by mouth daily as needed for pain.     [provider]  ibuprofen (ADVIL,MOTRIN) 400 MG tablet Take 1 tablet (400 mg total) by mouth every 6 (six) hours as needed. Patient not taking: Reported on 03/25/2018 07/30/17   Noemi Chapel, MD  pantoprazole ([place]) 40 MG tablet TAKE 1 TABLET BY MOUTH ONCE DAILY Patient not taking: Reported on 02/19/2018 06/13/17   Annitta Needs, NP  sulfamethoxazole-trimethoprim (BACTRIM DS,SEPTRA DS) 800-160 MG tablet Take 1 tablet by mouth 2 (two) times daily. 07/15/17   [provider]  tobramycin (TOBREX) 0.3 % ophthalmic solution Place 2 drops  into the left eye 4 (four) times daily. Patient not taking: Reported on 03/25/2018 12/14/17   Nat Christen, MD    Allergies as of 03/25/2018 - Review Complete 03/25/2018  Allergen Reaction Noted  . Ciprofloxacin Hives 09/21/2015  . Meclizine Other (See Comments) 09/21/2015    Family History  Problem Relation Age of Onset  . Arthritis Mother   . Hypertension Mother   . Hypertension Sister   . Pneumonia Brother   . Hypertension Sister   . Kidney disease Sister   . Colon cancer Neg Hx     Social History   Socioeconomic History  . Marital status: Widowed    Spouse name: Not on file  . Number of children: Not on file  . Years of education: Not on file  . Highest education level: Not on file  Occupational History  . Not on file  Social Needs  . Financial resource strain: Not on file  . Food insecurity:    Worry: Not on file    Inability: Not on file  . Transportation needs:    Medical: Not on file    Non-medical: Not on file  Tobacco Use  . Smoking status: Never Smoker  . Smokeless tobacco: Never Used  Substance and Sexual Activity  . Alcohol use: No    Alcohol/week: 0.0 standard drinks  . Drug use: No  . Sexual activity: Not on file  Lifestyle  . Physical activity:    Days per week: Not on file    Minutes per session: Not on file  . Stress: Not on file  Relationships  . Social connections:    Talks on phone: Not on file    Gets together: Not on file    Attends religious service: Not on file    Active member of club or organization: Not on file    Attends meetings of clubs or organizations: Not on file    Relationship status: Not on file  . Intimate partner violence:    Fear of current or ex partner: Not on file    Emotionally abused: Not on file    Physically abused: Not on file    Forced sexual activity: Not on file  Other Topics Concern  . Not on file  Social History Narrative  . Not on file    Review of Systems: See HPI, otherwise negative  ROS  Physical Exam: BP 139/77   Pulse 64   Temp (!) 97.4 F (36.3 C) (Oral)   Ht 5\' 4"  (1.626 m)   Wt 187 lb 12.8 oz (85.2 kg)   BMI 32.24 kg/m  General:   Alert,  Well-developed, well-nourished, pleasant and cooperative in NAD Neck:  Supple; no masses or thyromegaly. No significant cervical adenopathy. Lungs:  Clear throughout to auscultation.   No wheezes, crackles, or rhonchi. No acute distress. Heart:  Regular  rate and rhythm; no murmurs, clicks, rubs,  or gallops. Abdomen: Non-distended, normal bowel sounds.  Soft and nontender without appreciable mass or hepatosplenomegaly.  Pulses:  Normal pulses noted. Extremities:  Without clubbing or edema.  Impression/Plan: 82 year old lady with GERD controlled fairly well with Pepcid 20 mg each morning.  Developed a rash with pantoprazole.  She does have occasional breakthrough symptoms.  No alarm features.                                                       Recommendations:  GERD information provided  Continue Pepsid 20 mg each morning and may take a second dose at bedtime as needed.  Office visit in 1 year  Will add Protonix (pantoprazole) to your list of allergies   Notice: This dictation was prepared with Dragon dictation along with smaller phrase technology. Any transcriptional errors that result from this process are unintentional and may not be corrected upon review.

## 2018-03-25 NOTE — Patient Instructions (Addendum)
GERD information provided  Continue Pepsid 20 mg each morning and may take a second dose at bedtime as needed (will call in a prescription for Pepcid 20 mg tablets-dispense 60 -take 1 each morning and take a second in the evening as needed.  Your refill)  Office visit in 1 year  Will add Protonix (pantoprazole) to your list of allergies

## 2018-04-21 DIAGNOSIS — Z23 Encounter for immunization: Secondary | ICD-10-CM | POA: Diagnosis not present

## 2018-04-23 DIAGNOSIS — Z85828 Personal history of other malignant neoplasm of skin: Secondary | ICD-10-CM | POA: Diagnosis not present

## 2018-04-23 DIAGNOSIS — L259 Unspecified contact dermatitis, unspecified cause: Secondary | ICD-10-CM | POA: Diagnosis not present

## 2018-04-24 DIAGNOSIS — Z1331 Encounter for screening for depression: Secondary | ICD-10-CM | POA: Diagnosis not present

## 2018-04-24 DIAGNOSIS — E7800 Pure hypercholesterolemia, unspecified: Secondary | ICD-10-CM | POA: Diagnosis not present

## 2018-04-24 DIAGNOSIS — Z6831 Body mass index (BMI) 31.0-31.9, adult: Secondary | ICD-10-CM | POA: Diagnosis not present

## 2018-04-24 DIAGNOSIS — R0981 Nasal congestion: Secondary | ICD-10-CM | POA: Diagnosis not present

## 2018-04-24 DIAGNOSIS — Z1211 Encounter for screening for malignant neoplasm of colon: Secondary | ICD-10-CM | POA: Diagnosis not present

## 2018-04-24 DIAGNOSIS — Z299 Encounter for prophylactic measures, unspecified: Secondary | ICD-10-CM | POA: Diagnosis not present

## 2018-04-24 DIAGNOSIS — Z0000 Encounter for general adult medical examination without abnormal findings: Secondary | ICD-10-CM | POA: Diagnosis not present

## 2018-04-24 DIAGNOSIS — Z1339 Encounter for screening examination for other mental health and behavioral disorders: Secondary | ICD-10-CM | POA: Diagnosis not present

## 2018-04-24 DIAGNOSIS — R5383 Other fatigue: Secondary | ICD-10-CM | POA: Diagnosis not present

## 2018-04-24 DIAGNOSIS — I10 Essential (primary) hypertension: Secondary | ICD-10-CM | POA: Diagnosis not present

## 2018-04-24 DIAGNOSIS — Z7189 Other specified counseling: Secondary | ICD-10-CM | POA: Diagnosis not present

## 2018-04-25 DIAGNOSIS — E7800 Pure hypercholesterolemia, unspecified: Secondary | ICD-10-CM | POA: Diagnosis not present

## 2018-04-25 DIAGNOSIS — E559 Vitamin D deficiency, unspecified: Secondary | ICD-10-CM | POA: Diagnosis not present

## 2018-04-25 DIAGNOSIS — R5383 Other fatigue: Secondary | ICD-10-CM | POA: Diagnosis not present

## 2018-04-25 DIAGNOSIS — Z79899 Other long term (current) drug therapy: Secondary | ICD-10-CM | POA: Diagnosis not present

## 2018-05-05 DIAGNOSIS — Z7901 Long term (current) use of anticoagulants: Secondary | ICD-10-CM | POA: Diagnosis not present

## 2018-05-05 DIAGNOSIS — R079 Chest pain, unspecified: Secondary | ICD-10-CM | POA: Diagnosis not present

## 2018-05-05 DIAGNOSIS — I2699 Other pulmonary embolism without acute cor pulmonale: Secondary | ICD-10-CM | POA: Diagnosis not present

## 2018-05-07 DIAGNOSIS — Z7901 Long term (current) use of anticoagulants: Secondary | ICD-10-CM | POA: Insufficient documentation

## 2018-05-21 ENCOUNTER — Ambulatory Visit (INDEPENDENT_AMBULATORY_CARE_PROVIDER_SITE_OTHER): Payer: Medicare Other | Admitting: Orthopaedic Surgery

## 2018-05-21 ENCOUNTER — Encounter: Payer: Self-pay | Admitting: Orthopaedic Surgery

## 2018-05-21 VITALS — BP 142/78 | HR 60 | Ht 64.0 in | Wt 189.0 lb

## 2018-05-21 DIAGNOSIS — G8929 Other chronic pain: Secondary | ICD-10-CM

## 2018-05-21 DIAGNOSIS — M545 Low back pain, unspecified: Secondary | ICD-10-CM

## 2018-05-21 NOTE — Progress Notes (Signed)
Patient Angela House, female DOB:Jan 02, 1936, 82 y.o. VQM:[id number]  Chief Complaint  Patient presents with  . Back Pain    Lower back pain starting in pelvis going straight back. Has already been seen for pelvis.    HPI  Angela House is a 82 y.o. female who has developed more lower back pain usually first thing in the morning.  She has no paresthesias.  She has no trauma. She has no weakness.  After she gets moving along, she has little pain.  She is able to get about during the day.  She cannot take any NSAID as she is on a blood thinner.  She is active and does her exercises.   Body mass index is 32.44 kg/m.  ROS  Review of Systems  HENT: Negative for congestion.   Respiratory: Negative for cough and shortness of breath.   Cardiovascular: Negative for chest pain and leg swelling.  Gastrointestinal: Positive for abdominal pain.  Endocrine: Positive for cold intolerance.  Musculoskeletal: Positive for arthralgias, back pain and gait problem.  Allergic/Immunologic: Positive for environmental allergies.  All other systems reviewed and are negative.   All other systems reviewed and are negative.  The following is a summary of the past history medically, past history surgically, known current medicines, social history and family history.  This information is gathered electronically by the computer from prior information and documentation.  I review this each visit and have found including this information at this point in the chart is beneficial and informative.    Past Medical History:  Diagnosis Date  . Arthritis   . Hypertension   . PE (pulmonary thromboembolism) ([place])    bilaterally, 03/2017  . Restless leg syndrome     Past Surgical History:  Procedure Laterality Date  . ABDOMINAL HYSTERECTOMY    . CHOLECYSTECTOMY    . EYE SURGERY    . KNEE SURGERY    . WRIST SURGERY      Family History  Problem Relation Age of Onset  . Arthritis Mother   . Hypertension  Mother   . Hypertension Sister   . Pneumonia Brother   . Hypertension Sister   . Kidney disease Sister   . Colon cancer Neg Hx     Social History Social History   Tobacco Use  . Smoking status: Never Smoker  . Smokeless tobacco: Never Used  Substance Use Topics  . Alcohol use: No    Alcohol/week: 0.0 standard drinks  . Drug use: No    Allergies  Allergen Reactions  . Ciprofloxacin Hives  . Meclizine Other (See Comments)    dizziness    Current Outpatient Medications  Medication Sig Dispense Refill  . acetaminophen (TYLENOL) 325 MG tablet Take 325 mg by mouth every 6 (six) hours as needed.    Marland Kitchen acetaminophen (TYLENOL) 500 MG tablet Take 500 mg by mouth daily as needed for pain.     Marland Kitchen apixaban (ELIQUIS) 5 MG TABS tablet Take 10mg  po bid for 7 days then 5mg  po bid (Patient taking differently: Take 5 mg by mouth 2 (two) times daily. ) 60 tablet 1  . ASPERCREME W/LIDOCAINE EX Apply 1 application topically daily as needed (FOR HIP/KNEE PAIN).     Marland Kitchen calcium carbonate (OS-CAL - DOSED IN MG OF ELEMENTAL CALCIUM) 1250 (500 Ca) MG tablet Take 1 tablet by mouth 2 (two) times daily.     . Carboxymethylcellulose Sodium (THERATEARS) 0.25 % SOLN Apply 1 drop to eye as needed.     Marland Kitchen  cetirizine (ZYRTEC) 10 MG tablet Take 10 mg by mouth daily as needed.     . clobetasol (TEMOVATE) 0.05 % external solution Apply 1 application topically daily. Applied to scalp    . clobetasol cream (TEMOVATE) 1.61 % Apply 1 application topically daily as needed (FOR RASH).     Marland Kitchen cycloSPORINE (RESTASIS) 0.05 % ophthalmic emulsion Place 1 drop into both eyes 2 (two) times daily.    . diazepam (VALIUM) 2 MG tablet Take 2 mg by mouth every 6 (six) hours as needed for anxiety.    . famotidine (PEPCID) 20 MG tablet Take 20 mg by mouth daily.  2  . famotidine (PEPCID) 20 MG tablet Take 1 tablet (20 mg total) by mouth 2 (two) times daily. 60 tablet 1  . fluticasone (FLONASE) 50 MCG/ACT nasal spray Place 2 sprays into  the nose daily as needed for allergies or rhinitis.     . furosemide (LASIX) 20 MG tablet Take by mouth as needed.     . gabapentin (NEURONTIN) 100 MG capsule Take 100 mg by mouth 2 (two) times daily.     Marland Kitchen ibuprofen (ADVIL,MOTRIN) 400 MG tablet Take 1 tablet (400 mg total) by mouth every 6 (six) hours as needed. (Patient not taking: Reported on 03/25/2018) 30 tablet 0  . Multiple Vitamin (MULTIVITAMIN) capsule Take 1 capsule by mouth every morning.     . pantoprazole (PROTONIX) 40 MG tablet TAKE 1 TABLET BY MOUTH ONCE DAILY (Patient not taking: Reported on 02/19/2018) 90 tablet 3  . Potassium 99 MG TABS Take 1 tablet by mouth every other day.     . sulfamethoxazole-trimethoprim (BACTRIM DS,SEPTRA DS) 800-160 MG tablet Take 1 tablet by mouth 2 (two) times daily.    Marland Kitchen tobramycin (TOBREX) 0.3 % ophthalmic solution Place 2 drops into the left eye 4 (four) times daily. (Patient not taking: Reported on 03/25/2018) 5 mL 0  . VENTOLIN HFA 108 (90 Base) MCG/ACT inhaler Inhale 1-2 puffs into the lungs every 6 (six) hours as needed.      No current facility-administered medications for this visit.      Physical Exam  Blood pressure (!) 142/78, pulse 60, height 5\' 4"  (1.626 m), weight 189 lb (85.7 kg).  Constitutional: overall normal hygiene, normal nutrition, well developed, normal grooming, normal body habitus. Assistive device:none  Musculoskeletal: gait and station Limp none, muscle tone and strength are normal, no tremors or atrophy is present.  .  Neurological: coordination overall normal.  Deep tendon reflex/nerve stretch intact.  Sensation normal.  Cranial nerves II-XII intact.   Skin:   Normal overall no scars, lesions, ulcers or rashes. No psoriasis.  Psychiatric: Alert and oriented x 3.  Recent memory intact, remote memory unclear.  Normal mood and affect. Well groomed.  Good eye contact.  Cardiovascular: overall no swelling, no varicosities, no edema bilaterally, normal temperatures of  the legs and arms, no clubbing, cyanosis and good capillary refill.  Lymphatic: palpation is normal.  Spine/Pelvis examination:  Inspection:  Overall, sacoiliac joint benign and hips nontender; without crepitus or defects.   Thoracic spine inspection: Alignment normal without kyphosis present   Lumbar spine inspection:  Alignment  with normal lumbar lordosis, without scoliosis apparent.   Thoracic spine palpation:  without tenderness of spinal processes   Lumbar spine palpation: without tenderness of lumbar area; without tightness of lumbar muscles    Range of Motion:   Lumbar flexion, forward flexion is normal without pain or tenderness    Lumbar extension is  full without pain or tenderness   Left lateral bend is normal without pain or tenderness   Right lateral bend is normal without pain or tenderness   Straight leg raising is normal  Strength & tone: normal   Stability overall normal stability  All other systems reviewed and are negative   The patient has been educated about the nature of the problem(s) and counseled on treatment options.  The patient appeared to understand what I have discussed and is in agreement with it.  Encounter Diagnosis  Name Primary?  . Chronic midline low back pain without sciatica Yes    PLAN Call if any problems.  Precautions discussed.  Continue current medications.   Return to clinic 3 months   Electronically Signed Sanjuana Kava, MD 11/20/20192:30 PM

## 2018-05-27 DIAGNOSIS — Z23 Encounter for immunization: Secondary | ICD-10-CM | POA: Diagnosis not present

## 2018-06-02 ENCOUNTER — Other Ambulatory Visit: Payer: Self-pay | Admitting: Internal Medicine

## 2018-06-20 DIAGNOSIS — Z1231 Encounter for screening mammogram for malignant neoplasm of breast: Secondary | ICD-10-CM | POA: Diagnosis not present

## 2018-07-25 DIAGNOSIS — Z789 Other specified health status: Secondary | ICD-10-CM | POA: Diagnosis not present

## 2018-07-25 DIAGNOSIS — Z6831 Body mass index (BMI) 31.0-31.9, adult: Secondary | ICD-10-CM | POA: Diagnosis not present

## 2018-07-25 DIAGNOSIS — Z299 Encounter for prophylactic measures, unspecified: Secondary | ICD-10-CM | POA: Diagnosis not present

## 2018-07-25 DIAGNOSIS — I2699 Other pulmonary embolism without acute cor pulmonale: Secondary | ICD-10-CM | POA: Diagnosis not present

## 2018-07-25 DIAGNOSIS — I10 Essential (primary) hypertension: Secondary | ICD-10-CM | POA: Diagnosis not present

## 2018-07-25 DIAGNOSIS — R0981 Nasal congestion: Secondary | ICD-10-CM | POA: Diagnosis not present

## 2018-07-25 DIAGNOSIS — M1990 Unspecified osteoarthritis, unspecified site: Secondary | ICD-10-CM | POA: Diagnosis not present

## 2018-08-01 DIAGNOSIS — I2699 Other pulmonary embolism without acute cor pulmonale: Secondary | ICD-10-CM | POA: Diagnosis not present

## 2018-08-04 ENCOUNTER — Other Ambulatory Visit: Payer: Self-pay | Admitting: Gastroenterology

## 2018-08-05 DIAGNOSIS — Z7901 Long term (current) use of anticoagulants: Secondary | ICD-10-CM | POA: Diagnosis not present

## 2018-08-05 DIAGNOSIS — I2699 Other pulmonary embolism without acute cor pulmonale: Secondary | ICD-10-CM | POA: Diagnosis not present

## 2018-08-20 ENCOUNTER — Ambulatory Visit (INDEPENDENT_AMBULATORY_CARE_PROVIDER_SITE_OTHER): Payer: Medicare Other | Admitting: Orthopaedic Surgery

## 2018-08-20 ENCOUNTER — Encounter: Payer: Self-pay | Admitting: Orthopaedic Surgery

## 2018-08-20 VITALS — BP 128/78 | HR 64 | Ht 64.0 in | Wt 189.0 lb

## 2018-08-20 DIAGNOSIS — M2031 Hallux varus (acquired), right foot: Secondary | ICD-10-CM | POA: Diagnosis not present

## 2018-08-20 DIAGNOSIS — Z299 Encounter for prophylactic measures, unspecified: Secondary | ICD-10-CM | POA: Diagnosis not present

## 2018-08-20 DIAGNOSIS — M79671 Pain in right foot: Secondary | ICD-10-CM

## 2018-08-20 DIAGNOSIS — G8929 Other chronic pain: Secondary | ICD-10-CM | POA: Diagnosis not present

## 2018-08-20 DIAGNOSIS — Z6832 Body mass index (BMI) 32.0-32.9, adult: Secondary | ICD-10-CM | POA: Diagnosis not present

## 2018-08-20 DIAGNOSIS — K219 Gastro-esophageal reflux disease without esophagitis: Secondary | ICD-10-CM | POA: Diagnosis not present

## 2018-08-20 DIAGNOSIS — Z789 Other specified health status: Secondary | ICD-10-CM | POA: Diagnosis not present

## 2018-08-20 DIAGNOSIS — I10 Essential (primary) hypertension: Secondary | ICD-10-CM | POA: Diagnosis not present

## 2018-08-20 DIAGNOSIS — J029 Acute pharyngitis, unspecified: Secondary | ICD-10-CM | POA: Diagnosis not present

## 2018-08-20 DIAGNOSIS — M545 Low back pain: Secondary | ICD-10-CM | POA: Diagnosis not present

## 2018-08-20 NOTE — Progress Notes (Signed)
Patient Angela House, female DOB:08/03/1935, 83 y.o. IOE:[id number]  Chief Complaint  Patient presents with  . Back Pain  . Leg Pain    HPI  Angela House is a 83 y.o. female who has chronic lower back pain that has been made worse with the cold rainy weather. She has no new trauma, no paresthesias.  She has begun to develop a small hammertoe of the second toe on the right.  The capsule is tight.  I have shown her means to try to move the toe and straighten it out and exercises to do to stretch the capsule. She will do this.   Body mass index is 32.44 kg/m.  ROS  Review of Systems  HENT: Negative for congestion.   Respiratory: Negative for cough and shortness of breath.   Cardiovascular: Negative for chest pain and leg swelling.  Gastrointestinal: Positive for abdominal pain.  Endocrine: Positive for cold intolerance.  Musculoskeletal: Positive for arthralgias, back pain and gait problem.  Allergic/Immunologic: Positive for environmental allergies.  All other systems reviewed and are negative.   All other systems reviewed and are negative.  The following is a summary of the past history medically, past history surgically, known current medicines, social history and family history.  This information is gathered electronically by the computer from prior information and documentation.  I review this each visit and have found including this information at this point in the chart is beneficial and informative.    Past Medical History:  Diagnosis Date  . Arthritis   . Hypertension   . PE (pulmonary thromboembolism) ([place])    bilaterally, 03/2017  . Restless leg syndrome     Past Surgical History:  Procedure Laterality Date  . ABDOMINAL HYSTERECTOMY    . CHOLECYSTECTOMY    . EYE SURGERY    . KNEE SURGERY    . WRIST SURGERY      Family History  Problem Relation Age of Onset  . Arthritis Mother   . Hypertension Mother   . Hypertension Sister   . Pneumonia Brother    . Hypertension Sister   . Kidney disease Sister   . Colon cancer Neg Hx     Social History Social History   Tobacco Use  . Smoking status: Never Smoker  . Smokeless tobacco: Never Used  Substance Use Topics  . Alcohol use: No    Alcohol/week: 0.0 standard drinks  . Drug use: No    Allergies  Allergen Reactions  . Ciprofloxacin Hives  . Meclizine Other (See Comments)    dizziness    Current Outpatient Medications  Medication Sig Dispense Refill  . acetaminophen (TYLENOL) 325 MG tablet Take 325 mg by mouth every 6 (six) hours as needed.    Marland Kitchen acetaminophen (TYLENOL) 500 MG tablet Take 500 mg by mouth daily as needed for pain.     Marland Kitchen apixaban (ELIQUIS) 5 MG TABS tablet Take 10mg  po bid for 7 days then 5mg  po bid (Patient taking differently: Take 5 mg by mouth 2 (two) times daily. ) 60 tablet 1  . ASPERCREME W/LIDOCAINE EX Apply 1 application topically daily as needed (FOR HIP/KNEE PAIN).     Marland Kitchen calcium carbonate (OS-CAL - DOSED IN MG OF ELEMENTAL CALCIUM) 1250 (500 Ca) MG tablet Take 1 tablet by mouth 2 (two) times daily.     . Carboxymethylcellulose Sodium (THERATEARS) 0.25 % SOLN Apply 1 drop to eye as needed.     . cetirizine (ZYRTEC) 10 MG tablet Take 10 mg  by mouth daily as needed.     . clobetasol (TEMOVATE) 0.05 % external solution Apply 1 application topically daily. Applied to scalp    . clobetasol cream (TEMOVATE) 1.69 % Apply 1 application topically daily as needed (FOR RASH).     Marland Kitchen cycloSPORINE (RESTASIS) 0.05 % ophthalmic emulsion Place 1 drop into both eyes 2 (two) times daily.    . diazepam (VALIUM) 2 MG tablet Take 2 mg by mouth every 6 (six) hours as needed for anxiety.    . famotidine (PEPCID) 20 MG tablet Take 20 mg by mouth daily.  2  . famotidine (PEPCID) 20 MG tablet TAKE 1 TABLET BY MOUTH TWICE DAILY 60 tablet 3  . fluticasone (FLONASE) 50 MCG/ACT nasal spray Place 2 sprays into the nose daily as needed for allergies or rhinitis.     . furosemide (LASIX) 20  MG tablet Take by mouth as needed.     . gabapentin (NEURONTIN) 100 MG capsule Take 100 mg by mouth 2 (two) times daily.     Marland Kitchen ibuprofen (ADVIL,MOTRIN) 400 MG tablet Take 1 tablet (400 mg total) by mouth every 6 (six) hours as needed. (Patient not taking: Reported on 03/25/2018) 30 tablet 0  . Multiple Vitamin (MULTIVITAMIN) capsule Take 1 capsule by mouth every morning.     . pantoprazole (PROTONIX) 40 MG tablet TAKE 1 TABLET BY MOUTH ONCE DAILY (Patient not taking: Reported on 02/19/2018) 90 tablet 3  . Potassium 99 MG TABS Take 1 tablet by mouth every other day.     . sulfamethoxazole-trimethoprim (BACTRIM DS,SEPTRA DS) 800-160 MG tablet Take 1 tablet by mouth 2 (two) times daily.    Marland Kitchen tobramycin (TOBREX) 0.3 % ophthalmic solution Place 2 drops into the left eye 4 (four) times daily. (Patient not taking: Reported on 03/25/2018) 5 mL 0  . VENTOLIN HFA 108 (90 Base) MCG/ACT inhaler Inhale 1-2 puffs into the lungs every 6 (six) hours as needed.      No current facility-administered medications for this visit.      Physical Exam  Blood pressure 128/78, pulse 64, height 5\' 4"  (1.626 m), weight 189 lb (85.7 kg).  Constitutional: overall normal hygiene, normal nutrition, well developed, normal grooming, normal body habitus. Assistive device:none  Musculoskeletal: gait and station Limp right, muscle tone and strength are normal, no tremors or atrophy is present.  .  Neurological: coordination overall normal.  Deep tendon reflex/nerve stretch intact.  Sensation normal.  Cranial nerves II-XII intact.   Skin:   Normal overall no scars, lesions, ulcers or rashes. No psoriasis.  Psychiatric: Alert and oriented x 3.  Recent memory intact, remote memory unclear.  Normal mood and affect. Well groomed.  Good eye contact.  Cardiovascular: overall no swelling, no varicosities, no edema bilaterally, normal temperatures of the legs and arms, no clubbing, cyanosis and good capillary refill.  Lymphatic:  palpation is normal.  Spine/Pelvis examination:  Inspection:  Overall, sacoiliac joint benign and hips nontender; without crepitus or defects.   Thoracic spine inspection: Alignment normal without kyphosis present   Lumbar spine inspection:  Alignment  with normal lumbar lordosis, without scoliosis apparent.   Thoracic spine palpation:  without tenderness of spinal processes   Lumbar spine palpation: without tenderness of lumbar area; without tightness of lumbar muscles    Range of Motion:   Lumbar flexion, forward flexion is normal without pain or tenderness    Lumbar extension is full without pain or tenderness   Left lateral bend is normal without pain  or tenderness   Right lateral bend is normal without pain or tenderness   Straight leg raising is normal  Strength & tone: normal   Stability overall normal stability  She has early hammertoe changes of the right second toe with callus developing over the PIP joint and the toe has limited flexion.  NV intact.  All other systems reviewed and are negative   The patient has been educated about the nature of the problem(s) and counseled on treatment options.  The patient appeared to understand what I have discussed and is in agreement with it.  Encounter Diagnoses  Name Primary?  . Chronic midline low back pain without sciatica Yes  . Pain in right foot   . Hallux hammertoe, right     PLAN Call if any problems.  Precautions discussed.  Continue current medications.   Return to clinic 3 months   Electronically Signed Sanjuana Kava, MD 2/19/20202:47 PM

## 2018-08-21 DIAGNOSIS — N761 Subacute and chronic vaginitis: Secondary | ICD-10-CM | POA: Diagnosis not present

## 2018-10-16 DIAGNOSIS — Z6831 Body mass index (BMI) 31.0-31.9, adult: Secondary | ICD-10-CM | POA: Diagnosis not present

## 2018-10-16 DIAGNOSIS — I10 Essential (primary) hypertension: Secondary | ICD-10-CM | POA: Diagnosis not present

## 2018-10-16 DIAGNOSIS — H04129 Dry eye syndrome of unspecified lacrimal gland: Secondary | ICD-10-CM | POA: Diagnosis not present

## 2018-10-16 DIAGNOSIS — Z299 Encounter for prophylactic measures, unspecified: Secondary | ICD-10-CM | POA: Diagnosis not present

## 2018-10-16 DIAGNOSIS — N949 Unspecified condition associated with female genital organs and menstrual cycle: Secondary | ICD-10-CM | POA: Diagnosis not present

## 2018-10-16 DIAGNOSIS — M1990 Unspecified osteoarthritis, unspecified site: Secondary | ICD-10-CM | POA: Diagnosis not present

## 2018-10-20 DIAGNOSIS — L280 Lichen simplex chronicus: Secondary | ICD-10-CM | POA: Diagnosis not present

## 2018-10-20 DIAGNOSIS — Z85828 Personal history of other malignant neoplasm of skin: Secondary | ICD-10-CM | POA: Diagnosis not present

## 2018-10-20 DIAGNOSIS — L659 Nonscarring hair loss, unspecified: Secondary | ICD-10-CM | POA: Diagnosis not present

## 2018-10-24 DIAGNOSIS — H01026 Squamous blepharitis left eye, unspecified eyelid: Secondary | ICD-10-CM | POA: Diagnosis not present

## 2018-10-24 DIAGNOSIS — H01023 Squamous blepharitis right eye, unspecified eyelid: Secondary | ICD-10-CM | POA: Diagnosis not present

## 2018-10-27 DIAGNOSIS — I2699 Other pulmonary embolism without acute cor pulmonale: Secondary | ICD-10-CM | POA: Diagnosis not present

## 2018-10-31 DIAGNOSIS — I2699 Other pulmonary embolism without acute cor pulmonale: Secondary | ICD-10-CM | POA: Diagnosis not present

## 2018-10-31 DIAGNOSIS — Z7901 Long term (current) use of anticoagulants: Secondary | ICD-10-CM | POA: Diagnosis not present

## 2018-11-19 ENCOUNTER — Ambulatory Visit: Payer: Self-pay | Admitting: Orthopaedic Surgery

## 2018-11-20 ENCOUNTER — Encounter: Payer: Self-pay | Admitting: Orthopaedic Surgery

## 2018-11-20 ENCOUNTER — Other Ambulatory Visit: Payer: Self-pay

## 2018-11-20 ENCOUNTER — Ambulatory Visit (INDEPENDENT_AMBULATORY_CARE_PROVIDER_SITE_OTHER): Payer: Medicare Other | Admitting: Orthopaedic Surgery

## 2018-11-20 VITALS — BP 117/75 | HR 72 | Temp 97.0°F | Ht 64.0 in | Wt 192.0 lb

## 2018-11-20 DIAGNOSIS — M545 Low back pain: Secondary | ICD-10-CM

## 2018-11-20 DIAGNOSIS — G8929 Other chronic pain: Secondary | ICD-10-CM | POA: Diagnosis not present

## 2018-11-20 DIAGNOSIS — M79671 Pain in right foot: Secondary | ICD-10-CM | POA: Diagnosis not present

## 2018-11-20 DIAGNOSIS — M2031 Hallux varus (acquired), right foot: Secondary | ICD-10-CM

## 2018-11-20 NOTE — Progress Notes (Signed)
Patient Angela House, female DOB:11/01/1935, 83 y.o. EUM:[id number]  Chief Complaint  Patient presents with  . Foot Pain    Right foot swelling, no injury.     HPI  Angela House is a 83 y.o. female who has lower back pain, right ankle pain and hammer toe right second.  She has been doing fair with her back.  She has good and bad days. The rainy weather has made it worse. She has no new trauma.  She has had swelling of the right ankle.  Her family doctor gave her Lasix which helped.  She has some tenderness laterally.  She has no trauma, no redness.  Her second toe on the right foot is not as tender. She has been doing stretching exercises.   Body mass index is 32.96 kg/m.  ROS  Review of Systems  HENT: Negative for congestion.   Respiratory: Negative for cough and shortness of breath.   Cardiovascular: Negative for chest pain and leg swelling.  Gastrointestinal: Positive for abdominal pain.  Endocrine: Positive for cold intolerance.  Musculoskeletal: Positive for arthralgias, back pain and gait problem.  Allergic/Immunologic: Positive for environmental allergies.  All other systems reviewed and are negative.   All other systems reviewed and are negative.  The following is a summary of the past history medically, past history surgically, known current medicines, social history and family history.  This information is gathered electronically by the computer from prior information and documentation.  I review this each visit and have found including this information at this point in the chart is beneficial and informative.    Past Medical History:  Diagnosis Date  . Arthritis   . Hypertension   . PE (pulmonary thromboembolism) ([place])    bilaterally, 03/2017  . Restless leg syndrome     Past Surgical History:  Procedure Laterality Date  . ABDOMINAL HYSTERECTOMY    . CHOLECYSTECTOMY    . EYE SURGERY    . KNEE SURGERY    . WRIST SURGERY      Family History   Problem Relation Age of Onset  . Arthritis Mother   . Hypertension Mother   . Hypertension Sister   . Pneumonia Brother   . Hypertension Sister   . Kidney disease Sister   . Colon cancer Neg Hx     Social History Social History   Tobacco Use  . Smoking status: Never Smoker  . Smokeless tobacco: Never Used  Substance Use Topics  . Alcohol use: No    Alcohol/week: 0.0 standard drinks  . Drug use: No    Allergies  Allergen Reactions  . Ciprofloxacin Hives  . Meclizine Other (See Comments)    dizziness    Current Outpatient Medications  Medication Sig Dispense Refill  . acetaminophen (TYLENOL) 325 MG tablet Take 325 mg by mouth every 6 (six) hours as needed.    Marland Kitchen acetaminophen (TYLENOL) 500 MG tablet Take 500 mg by mouth daily as needed for pain.     Marland Kitchen apixaban (ELIQUIS) 5 MG TABS tablet Take 10mg  po bid for 7 days then 5mg  po bid (Patient taking differently: Take 5 mg by mouth 2 (two) times daily. ) 60 tablet 1  . ASPERCREME W/LIDOCAINE EX Apply 1 application topically daily as needed (FOR HIP/KNEE PAIN).     Marland Kitchen calcium carbonate (OS-CAL - DOSED IN MG OF ELEMENTAL CALCIUM) 1250 (500 Ca) MG tablet Take 1 tablet by mouth 2 (two) times daily.     . Carboxymethylcellulose Sodium (THERATEARS)  0.25 % SOLN Apply 1 drop to eye as needed.     . cetirizine (ZYRTEC) 10 MG tablet Take 10 mg by mouth daily as needed.     . clobetasol (TEMOVATE) 0.05 % external solution Apply 1 application topically daily. Applied to scalp    . clobetasol cream (TEMOVATE) 8.93 % Apply 1 application topically daily as needed (FOR RASH).     Marland Kitchen cycloSPORINE (RESTASIS) 0.05 % ophthalmic emulsion Place 1 drop into both eyes 2 (two) times daily.    . diazepam (VALIUM) 2 MG tablet Take 2 mg by mouth every 6 (six) hours as needed for anxiety.    . famotidine (PEPCID) 20 MG tablet Take 20 mg by mouth daily.  2  . famotidine (PEPCID) 20 MG tablet TAKE 1 TABLET BY MOUTH TWICE DAILY 60 tablet 3  . fluticasone  (FLONASE) 50 MCG/ACT nasal spray Place 2 sprays into the nose daily as needed for allergies or rhinitis.     . furosemide (LASIX) 20 MG tablet Take by mouth as needed.     . gabapentin (NEURONTIN) 100 MG capsule Take 100 mg by mouth 2 (two) times daily.     Marland Kitchen ibuprofen (ADVIL,MOTRIN) 400 MG tablet Take 1 tablet (400 mg total) by mouth every 6 (six) hours as needed. (Patient not taking: Reported on 03/25/2018) 30 tablet 0  . Multiple Vitamin (MULTIVITAMIN) capsule Take 1 capsule by mouth every morning.     . pantoprazole (PROTONIX) 40 MG tablet TAKE 1 TABLET BY MOUTH ONCE DAILY (Patient not taking: Reported on 02/19/2018) 90 tablet 3  . Potassium 99 MG TABS Take 1 tablet by mouth every other day.     . sulfamethoxazole-trimethoprim (BACTRIM DS,SEPTRA DS) 800-160 MG tablet Take 1 tablet by mouth 2 (two) times daily.    Marland Kitchen tobramycin (TOBREX) 0.3 % ophthalmic solution Place 2 drops into the left eye 4 (four) times daily. (Patient not taking: Reported on 03/25/2018) 5 mL 0  . VENTOLIN HFA 108 (90 Base) MCG/ACT inhaler Inhale 1-2 puffs into the lungs every 6 (six) hours as needed.      No current facility-administered medications for this visit.      Physical Exam  Blood pressure 117/75, pulse 72, temperature (!) 97 F (36.1 C), height 5\' 4"  (1.626 m), weight 192 lb (87.1 kg).  Constitutional: overall normal hygiene, normal nutrition, well developed, normal grooming, normal body habitus. Assistive device:none  Musculoskeletal: gait and station Limp right, muscle tone and strength are normal, no tremors or atrophy is present.  .  Neurological: coordination overall normal.  Deep tendon reflex/nerve stretch intact.  Sensation normal.  Cranial nerves II-XII intact.   Skin:   Normal overall no scars, lesions, ulcers or rashes. No psoriasis.  Psychiatric: Alert and oriented x 3.  Recent memory intact, remote memory unclear.  Normal mood and affect. Well groomed.  Good eye contact.  Cardiovascular:  overall no swelling, no varicosities, no edema bilaterally, normal temperatures of the legs and arms, no clubbing, cyanosis and good capillary refill.  Lymphatic: palpation is normal.  The right ankle has some slight swelling laterally but full ROM and no pain, no redness.  The right second toe is not swollen.  She has beginnings of a hammer toe.  NV intact.  Spine/Pelvis examination:  Inspection:  Overall, sacoiliac joint benign and hips nontender; without crepitus or defects.   Thoracic spine inspection: Alignment normal without kyphosis present   Lumbar spine inspection:  Alignment  with normal lumbar lordosis, without scoliosis  apparent.   Thoracic spine palpation:  without tenderness of spinal processes   Lumbar spine palpation: without tenderness of lumbar area; without tightness of lumbar muscles    Range of Motion:   Lumbar flexion, forward flexion is normal without pain or tenderness    Lumbar extension is full without pain or tenderness   Left lateral bend is normal without pain or tenderness   Right lateral bend is normal without pain or tenderness   Straight leg raising is normal  Strength & tone: normal   Stability overall normal stability; All other systems reviewed and are negative   The patient has been educated about the nature of the problem(s) and counseled on treatment options.  The patient appeared to understand what I have discussed and is in agreement with it.  Encounter Diagnoses  Name Primary?  . Chronic midline low back pain without sciatica Yes  . Pain in right foot   . Hallux hammertoe, right     PLAN Call if any problems.  Precautions discussed.  Continue current medications.   Return to clinic 3 months   Electronically Signed Sanjuana Kava, MD 5/21/20209:35 AM

## 2018-11-25 DIAGNOSIS — D3132 Benign neoplasm of left choroid: Secondary | ICD-10-CM | POA: Diagnosis not present

## 2018-11-25 DIAGNOSIS — H0102A Squamous blepharitis right eye, upper and lower eyelids: Secondary | ICD-10-CM | POA: Diagnosis not present

## 2018-11-25 DIAGNOSIS — Z961 Presence of intraocular lens: Secondary | ICD-10-CM | POA: Diagnosis not present

## 2018-11-25 DIAGNOSIS — D3131 Benign neoplasm of right choroid: Secondary | ICD-10-CM | POA: Diagnosis not present

## 2018-11-25 DIAGNOSIS — H1045 Other chronic allergic conjunctivitis: Secondary | ICD-10-CM | POA: Diagnosis not present

## 2018-11-25 DIAGNOSIS — H0102B Squamous blepharitis left eye, upper and lower eyelids: Secondary | ICD-10-CM | POA: Diagnosis not present

## 2018-11-25 DIAGNOSIS — H16223 Keratoconjunctivitis sicca, not specified as Sjogren's, bilateral: Secondary | ICD-10-CM | POA: Diagnosis not present

## 2018-12-01 DIAGNOSIS — B351 Tinea unguium: Secondary | ICD-10-CM | POA: Diagnosis not present

## 2018-12-01 DIAGNOSIS — Z299 Encounter for prophylactic measures, unspecified: Secondary | ICD-10-CM | POA: Diagnosis not present

## 2018-12-01 DIAGNOSIS — I10 Essential (primary) hypertension: Secondary | ICD-10-CM | POA: Diagnosis not present

## 2018-12-01 DIAGNOSIS — I739 Peripheral vascular disease, unspecified: Secondary | ICD-10-CM | POA: Diagnosis not present

## 2018-12-01 DIAGNOSIS — Z6832 Body mass index (BMI) 32.0-32.9, adult: Secondary | ICD-10-CM | POA: Diagnosis not present

## 2018-12-01 DIAGNOSIS — M79606 Pain in leg, unspecified: Secondary | ICD-10-CM | POA: Diagnosis not present

## 2018-12-02 ENCOUNTER — Other Ambulatory Visit: Payer: Self-pay | Admitting: Gastroenterology

## 2018-12-16 DIAGNOSIS — I2699 Other pulmonary embolism without acute cor pulmonale: Secondary | ICD-10-CM | POA: Diagnosis not present

## 2018-12-16 DIAGNOSIS — Z6832 Body mass index (BMI) 32.0-32.9, adult: Secondary | ICD-10-CM | POA: Diagnosis not present

## 2018-12-16 DIAGNOSIS — G629 Polyneuropathy, unspecified: Secondary | ICD-10-CM | POA: Diagnosis not present

## 2018-12-16 DIAGNOSIS — I10 Essential (primary) hypertension: Secondary | ICD-10-CM | POA: Diagnosis not present

## 2018-12-16 DIAGNOSIS — I739 Peripheral vascular disease, unspecified: Secondary | ICD-10-CM | POA: Diagnosis not present

## 2018-12-16 DIAGNOSIS — Z299 Encounter for prophylactic measures, unspecified: Secondary | ICD-10-CM | POA: Diagnosis not present

## 2019-02-02 DIAGNOSIS — I10 Essential (primary) hypertension: Secondary | ICD-10-CM | POA: Diagnosis not present

## 2019-02-02 DIAGNOSIS — I2699 Other pulmonary embolism without acute cor pulmonale: Secondary | ICD-10-CM | POA: Diagnosis not present

## 2019-02-03 IMAGING — CT CT ANGIO CHEST
2 of 6 series · 18 of 46 positions shown · IV contrast (Isovue)
Comparison: Chest radiograph earlier this day.  Chest CT 05/17/2013

CLINICAL DATA: PE suspected, intermediate prob, positive D-dimer.
Shortness of breath since this morning.

EXAM:
CT ANGIOGRAPHY CHEST WITH CONTRAST
TECHNIQUE: Multidetector CT imaging of the chest was performed using the
standard protocol during bolus administration of intravenous
contrast. Multiplanar CT image reconstructions and MIPs were
obtained to evaluate the vascular anatomy.
CONTRAST:  100 cc Isovue 370 IV

[Series 5: thins · axial · 0.60mm/px · z∈[+1385,+1616]mm · 15 of 255 slices shown]
[im 12/255  lung]
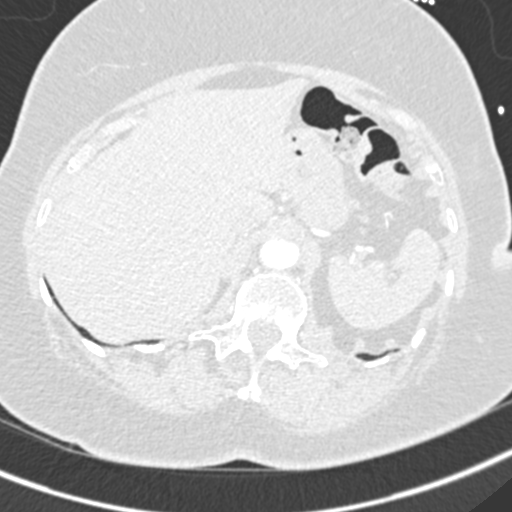
[im 34/255  soft-tissue]
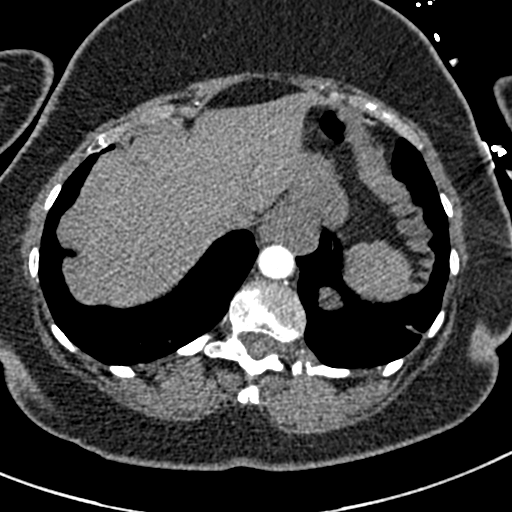
[im 45/255  lung]
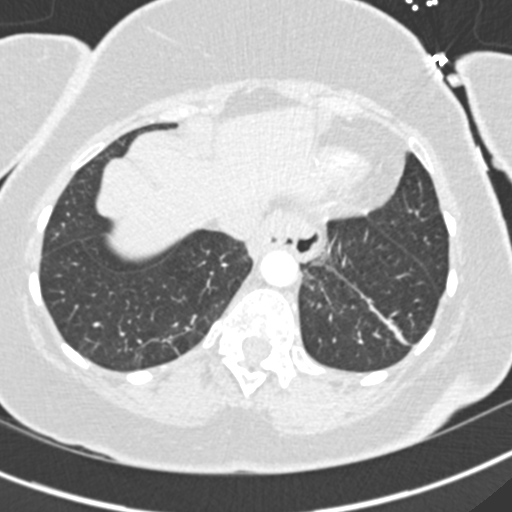
[im 67/255  soft-tissue]
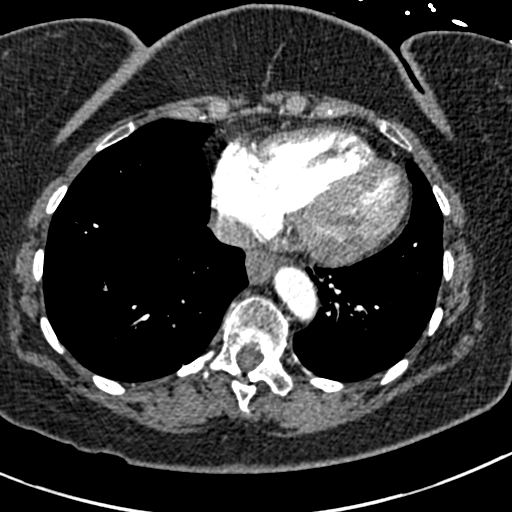
[im 78/255  lung]
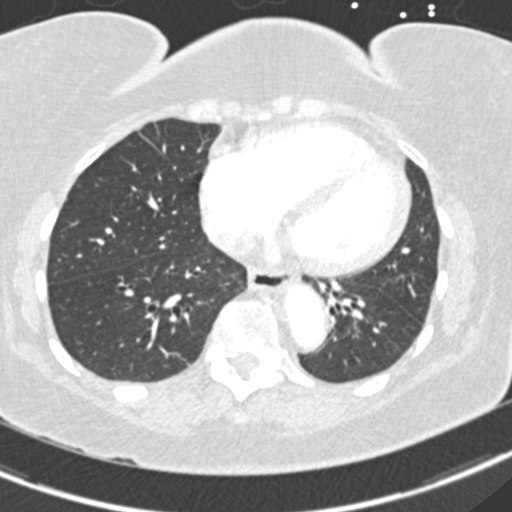
[im 100/255  soft-tissue]
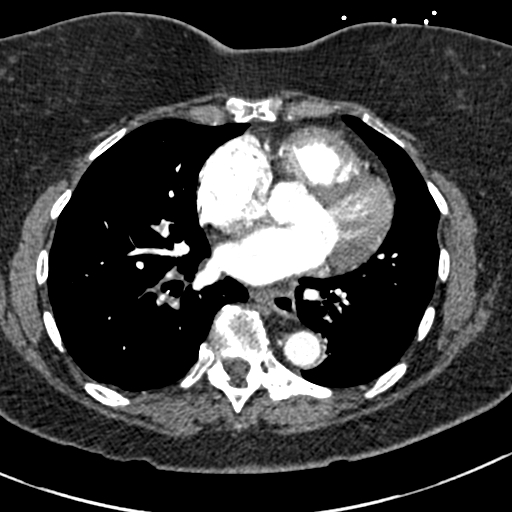
[im 111/255  lung]
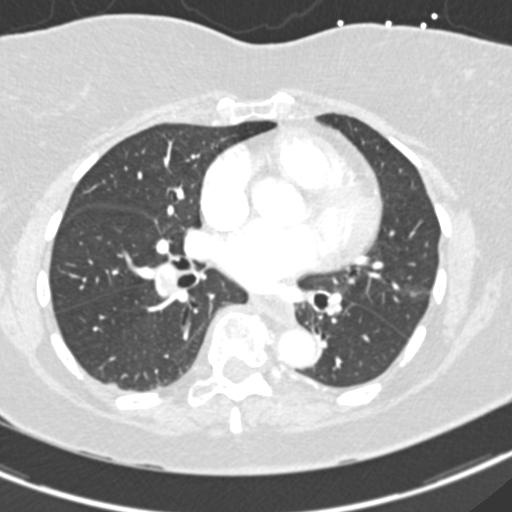
[im 133/255  soft-tissue]
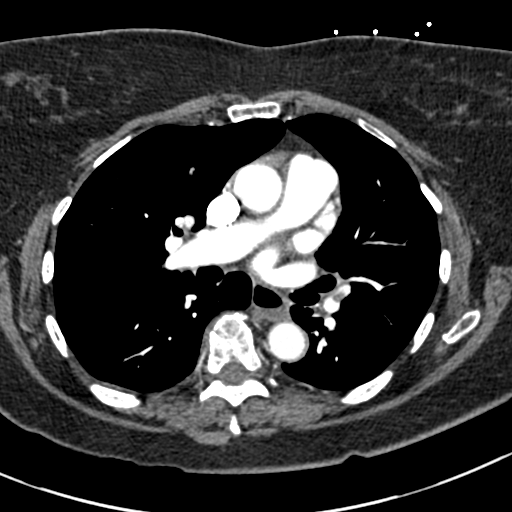
[im 144/255  lung]
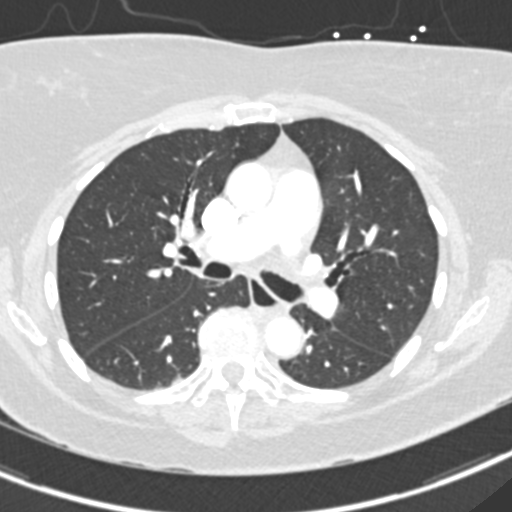
[im 155/255  soft-tissue]
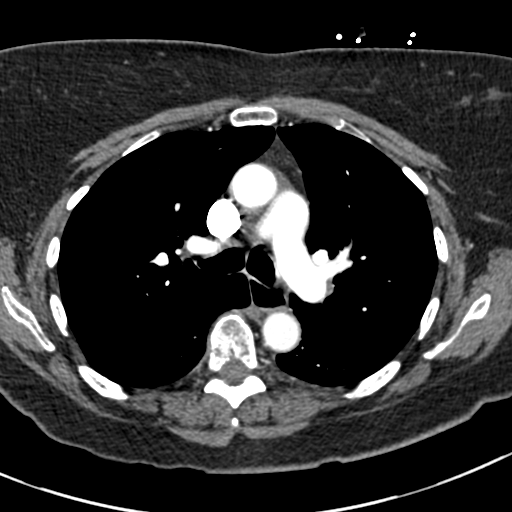
[im 177/255  lung]
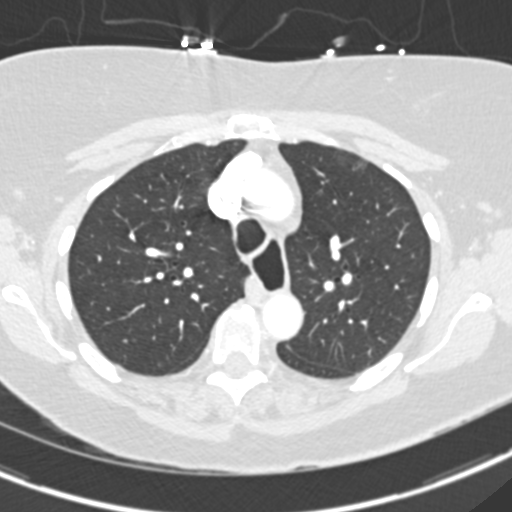
[im 188/255  soft-tissue]
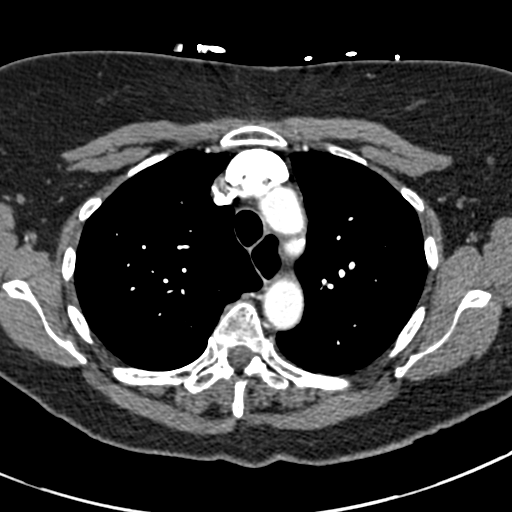
[im 210/255  lung]
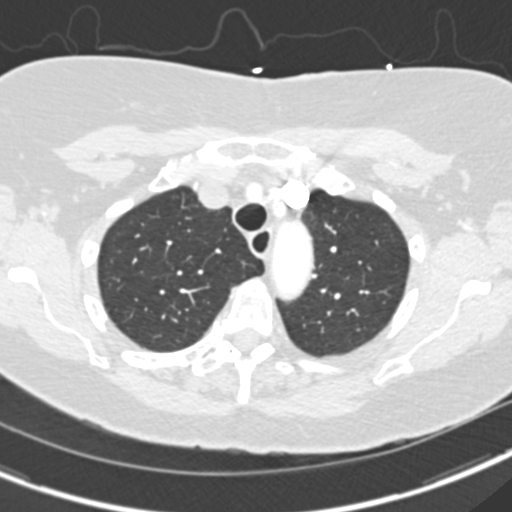
[im 221/255  soft-tissue]
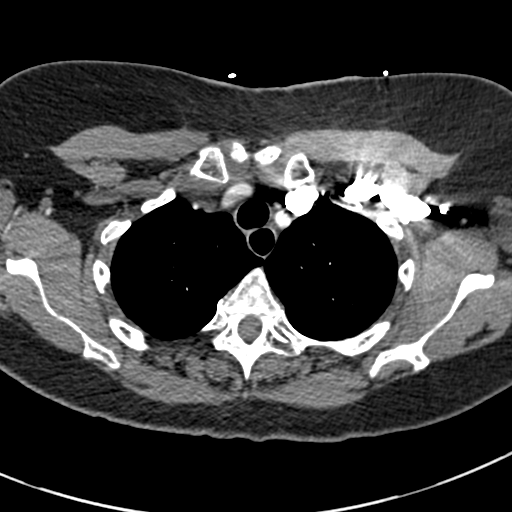
[im 243/255  lung]
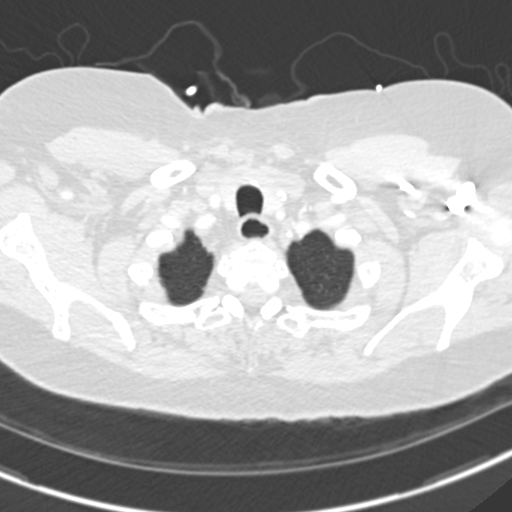

[Series 7: coronal mpr · coronal · 0.50mm/px · 3 of 95 slices shown]
[im 24/95  soft-tissue]
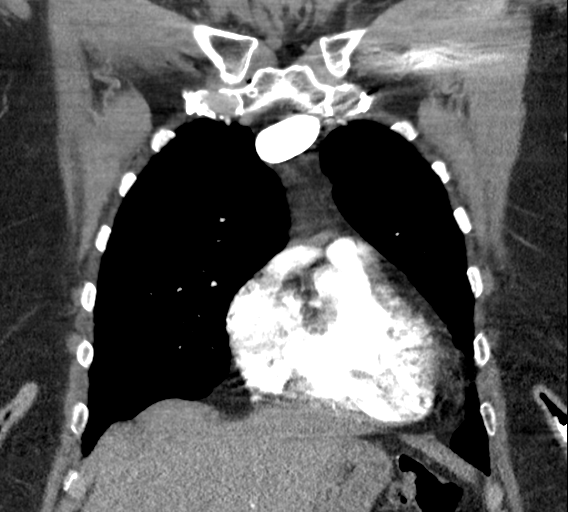
[im 48/95  soft-tissue]
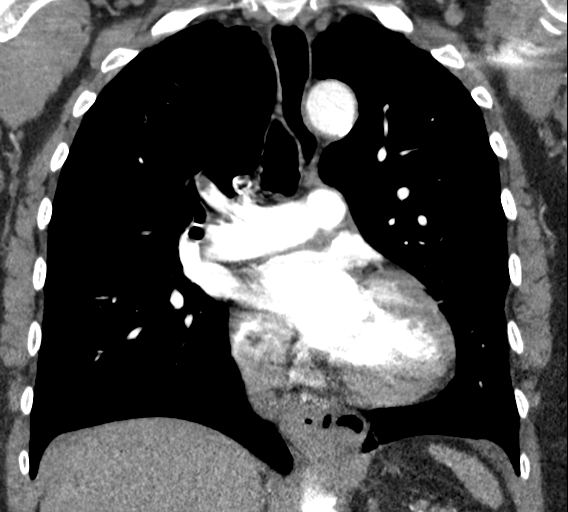
[im 71/95  soft-tissue]
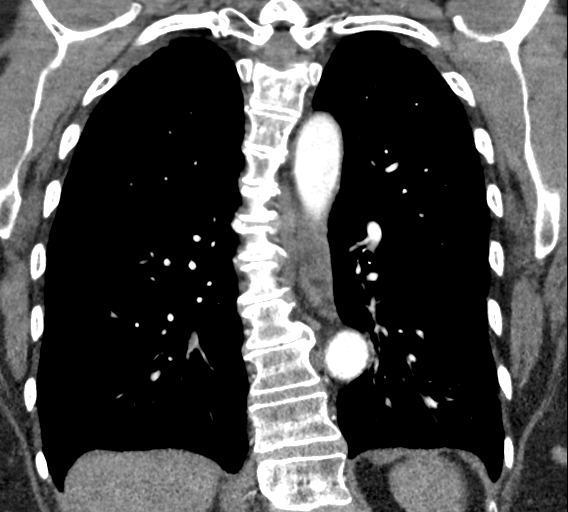

[18 of 46 positions shown; findings below may reference images not displayed]

FINDINGS: Cardiovascular: Positive for bilateral pulmonary emboli.
Thromboembolic burden greater on the right with involvement of the
segmental arteries in the right upper, middle, and lower lobes. Left
lower lobe pulmonary emboli involves the segmental bifurcation of
the lower lobe. Overall thromboembolic burden is small to moderate.
RV to LV ratio is 0.95. Aortic tortuosity and mild atherosclerosis
without aneurysm or dissection. Heart size upper normal.

Mediastinum/Nodes: No mediastinal or hilar adenopathy. The esophagus
is patulous and air-filled. Small hiatal hernia. Calcified
subcentimeter left thyroid nodule. No axillary adenopathy.

Lungs/Pleura: Linear atelectasis in both lower lobes. No evidence of
pulmonary infarct. No confluent consolidation. No pulmonary edema.
No pleural fluid. No pneumothorax.

Upper Abdomen: Small hiatal hernia.  No acute abnormality.

Musculoskeletal: Scoliosis and mild degenerative change in the
spine. There are no acute or suspicious osseous abnormalities.

Review of the MIP images confirms the above findings.
IMPRESSION: 1. Positive for bilateral acute pulmonary emboli. Thromboembolic
burden is small to moderate, greater on the right involving the
segmental arteries of the upper, middle and lower lobes. CT evidence
of right heart strain (RV/LV Ratio = 0.95) consistent with at least
submassive (intermediate risk) PE. The presence of right heart
strain has been associated with an increased risk of morbidity and
mortality. Please activate Code PE by paging [phone].
2. Small hiatal hernia, incidentally noted.
Critical Value/emergent results were called by telephone at the time
of interpretation on 03/04/2017 at [DATE] to Dr. Jenny, who
verbally acknowledged these results.

Aortic Atherosclerosis ([id number]-F31.1).

## 2019-02-05 DIAGNOSIS — I2699 Other pulmonary embolism without acute cor pulmonale: Secondary | ICD-10-CM | POA: Diagnosis not present

## 2019-02-05 DIAGNOSIS — Z7901 Long term (current) use of anticoagulants: Secondary | ICD-10-CM | POA: Diagnosis not present

## 2019-02-13 DIAGNOSIS — Z23 Encounter for immunization: Secondary | ICD-10-CM | POA: Diagnosis not present

## 2019-02-18 ENCOUNTER — Ambulatory Visit (INDEPENDENT_AMBULATORY_CARE_PROVIDER_SITE_OTHER): Payer: Medicare Other | Admitting: Orthopaedic Surgery

## 2019-02-18 ENCOUNTER — Other Ambulatory Visit: Payer: Self-pay

## 2019-02-18 ENCOUNTER — Encounter: Payer: Self-pay | Admitting: Orthopaedic Surgery

## 2019-02-18 VITALS — BP 166/79 | HR 78 | Ht 65.0 in | Wt 192.0 lb

## 2019-02-18 DIAGNOSIS — G8929 Other chronic pain: Secondary | ICD-10-CM

## 2019-02-18 DIAGNOSIS — M545 Low back pain: Secondary | ICD-10-CM | POA: Diagnosis not present

## 2019-02-18 MED ORDER — PREDNISONE 5 MG (21) PO TBPK: ORAL_TABLET | ORAL | 0 refills | Status: DC

## 2019-02-18 NOTE — Progress Notes (Signed)
Patient Angela House, female DOB:29-Mar-1936, 83 y.o. DVV:[id number]  Chief Complaint  Patient presents with  . Back Pain    /radiating down r left leg    HPI  Angela House is a 83 y.o. female who has a flare up of lower back pain over the last several weeks.  She saw her family doctor and was given pain medicine, muscle relaxant and Neurontin.  She is a little better but still has midline pain with no paresthesias. She has no numbness or any trauma.  She is a little better.  I offered PT for her but she declines.  She is on Eliquis and cannot take NSAIDs.  I will give prednisone dose pack.  I will see her in one month.   Body mass index is 31.95 kg/m.  ROS  Review of Systems  HENT: Negative for congestion.   Respiratory: Negative for cough and shortness of breath.   Cardiovascular: Negative for chest pain and leg swelling.  Gastrointestinal: Positive for abdominal pain.  Endocrine: Positive for cold intolerance.  Musculoskeletal: Positive for arthralgias, back pain and gait problem.  Allergic/Immunologic: Positive for environmental allergies.  All other systems reviewed and are negative.   All other systems reviewed and are negative.  The following is a summary of the past history medically, past history surgically, known current medicines, social history and family history.  This information is gathered electronically by the computer from prior information and documentation.  I review this each visit and have found including this information at this point in the chart is beneficial and informative.    Past Medical History:  Diagnosis Date  . Arthritis   . Hypertension   . PE (pulmonary thromboembolism) ([place])    bilaterally, 03/2017  . Restless leg syndrome     Past Surgical History:  Procedure Laterality Date  . ABDOMINAL HYSTERECTOMY    . CHOLECYSTECTOMY    . EYE SURGERY    . KNEE SURGERY    . WRIST SURGERY      Family History  Problem Relation Age of  Onset  . Arthritis Mother   . Hypertension Mother   . Hypertension Sister   . Pneumonia Brother   . Hypertension Sister   . Kidney disease Sister   . Colon cancer Neg Hx     Social History Social History   Tobacco Use  . Smoking status: Never Smoker  . Smokeless tobacco: Never Used  Substance Use Topics  . Alcohol use: No    Alcohol/week: 0.0 standard drinks  . Drug use: No    Allergies  Allergen Reactions  . Ciprofloxacin Hives  . Meclizine Other (See Comments)    dizziness    Current Outpatient Medications  Medication Sig Dispense Refill  . acetaminophen (TYLENOL) 325 MG tablet Take 325 mg by mouth every 6 (six) hours as needed.    Marland Kitchen acetaminophen (TYLENOL) 500 MG tablet Take 500 mg by mouth daily as needed for pain.     Marland Kitchen apixaban (ELIQUIS) 5 MG TABS tablet Take 10mg  po bid for 7 days then 5mg  po bid (Patient taking differently: Take 5 mg by mouth 2 (two) times daily. ) 60 tablet 1  . ASPERCREME W/LIDOCAINE EX Apply 1 application topically daily as needed (FOR HIP/KNEE PAIN).     Marland Kitchen calcium carbonate (OS-CAL - DOSED IN MG OF ELEMENTAL CALCIUM) 1250 (500 Ca) MG tablet Take 1 tablet by mouth 2 (two) times daily.     . Carboxymethylcellulose Sodium (THERATEARS) 0.25 %  SOLN Apply 1 drop to eye as needed.     . cetirizine (ZYRTEC) 10 MG tablet Take 10 mg by mouth daily as needed.     . clobetasol (TEMOVATE) 0.05 % external solution Apply 1 application topically daily. Applied to scalp    . clobetasol cream (TEMOVATE) 7.00 % Apply 1 application topically daily as needed (FOR RASH).     Marland Kitchen cycloSPORINE (RESTASIS) 0.05 % ophthalmic emulsion Place 1 drop into both eyes 2 (two) times daily.    . diazepam (VALIUM) 2 MG tablet Take 2 mg by mouth every 6 (six) hours as needed for anxiety.    . famotidine (PEPCID) 20 MG tablet Take 20 mg by mouth daily.  2  . famotidine (PEPCID) 20 MG tablet Take 1 tablet by mouth twice daily 60 tablet 5  . fluticasone (FLONASE) 50 MCG/ACT nasal spray  Place 2 sprays into the nose daily as needed for allergies or rhinitis.     . furosemide (LASIX) 20 MG tablet Take by mouth as needed.     . gabapentin (NEURONTIN) 100 MG capsule Take 100 mg by mouth 2 (two) times daily.     Marland Kitchen ibuprofen (ADVIL,MOTRIN) 400 MG tablet Take 1 tablet (400 mg total) by mouth every 6 (six) hours as needed. (Patient not taking: Reported on 03/25/2018) 30 tablet 0  . Multiple Vitamin (MULTIVITAMIN) capsule Take 1 capsule by mouth every morning.     . pantoprazole (PROTONIX) 40 MG tablet TAKE 1 TABLET BY MOUTH ONCE DAILY (Patient not taking: Reported on 02/19/2018) 90 tablet 3  . Potassium 99 MG TABS Take 1 tablet by mouth every other day.     . predniSONE (STERAPRED UNI-PAK 21 TAB) 5 MG (21) TBPK tablet Take 6 pills first day; 5 pills second day; 4 pills third day; 3 pills fourth day; 2 pills next day and 1 pill last day. 21 tablet 0  . sulfamethoxazole-trimethoprim (BACTRIM DS,SEPTRA DS) 800-160 MG tablet Take 1 tablet by mouth 2 (two) times daily.    Marland Kitchen tobramycin (TOBREX) 0.3 % ophthalmic solution Place 2 drops into the left eye 4 (four) times daily. (Patient not taking: Reported on 03/25/2018) 5 mL 0  . VENTOLIN HFA 108 (90 Base) MCG/ACT inhaler Inhale 1-2 puffs into the lungs every 6 (six) hours as needed.      No current facility-administered medications for this visit.      Physical Exam  Blood pressure (!) 166/79, pulse 78, height 5\' 5"  (1.651 m), weight 192 lb (87.1 kg).  Constitutional: overall normal hygiene, normal nutrition, well developed, normal grooming, normal body habitus. Assistive device:none  Musculoskeletal: gait and station Limp none, muscle tone and strength are normal, no tremors or atrophy is present.  .  Neurological: coordination overall normal.  Deep tendon reflex/nerve stretch intact.  Sensation normal.  Cranial nerves II-XII intact.   Skin:   Normal overall no scars, lesions, ulcers or rashes. No psoriasis.  Psychiatric: Alert and  oriented x 3.  Recent memory intact, remote memory unclear.  Normal mood and affect. Well groomed.  Good eye contact.  Cardiovascular: overall no swelling, no varicosities, no edema bilaterally, normal temperatures of the legs and arms, no clubbing, cyanosis and good capillary refill.  Spine/Pelvis examination:  Inspection:  Overall, sacoiliac joint benign and hips nontender; without crepitus or defects.   Thoracic spine inspection: Alignment normal without kyphosis present   Lumbar spine inspection:  Alignment  with normal lumbar lordosis, without scoliosis apparent.   Thoracic spine palpation:  without tenderness of spinal processes   Lumbar spine palpation: without tenderness of lumbar area; without tightness of lumbar muscles    Range of Motion:   Lumbar flexion, forward flexion is normal without pain or tenderness    Lumbar extension is full without pain or tenderness   Left lateral bend is normal without pain or tenderness   Right lateral bend is normal without pain or tenderness   Straight leg raising is normal  Strength & tone: normal   Stability overall normal stability  Lymphatic: palpation is normal.  All other systems reviewed and are negative   The patient has been educated about the nature of the problem(s) and counseled on treatment options.  The patient appeared to understand what I have discussed and is in agreement with it.  Encounter Diagnosis  Name Primary?  . Chronic midline low back pain without sciatica Yes    PLAN Call if any problems.  Precautions discussed.  Continue current medications. I have called in prednisone dose pack.  Return to clinic 1 month   Electronically Signed Sanjuana Kava, MD 8/19/20209:37 AM

## 2019-03-16 ENCOUNTER — Encounter: Payer: Self-pay | Admitting: Internal Medicine

## 2019-03-17 ENCOUNTER — Encounter: Payer: Self-pay | Admitting: Orthopaedic Surgery

## 2019-03-18 ENCOUNTER — Encounter: Payer: Self-pay | Admitting: Orthopaedic Surgery

## 2019-03-18 ENCOUNTER — Other Ambulatory Visit: Payer: Self-pay

## 2019-03-18 ENCOUNTER — Ambulatory Visit (INDEPENDENT_AMBULATORY_CARE_PROVIDER_SITE_OTHER): Payer: Medicare Other | Admitting: Orthopaedic Surgery

## 2019-03-18 VITALS — BP 145/106 | HR 74 | Ht 65.0 in | Wt 193.1 lb

## 2019-03-18 DIAGNOSIS — M545 Low back pain: Secondary | ICD-10-CM | POA: Diagnosis not present

## 2019-03-18 DIAGNOSIS — G8929 Other chronic pain: Secondary | ICD-10-CM | POA: Diagnosis not present

## 2019-03-18 DIAGNOSIS — Z7901 Long term (current) use of anticoagulants: Secondary | ICD-10-CM | POA: Diagnosis not present

## 2019-03-18 NOTE — Progress Notes (Signed)
Patient Angela House, female DOB:11-Dec-1935, 83 y.o. [name]  Chief Complaint  Patient presents with  . Back Pain    Doing about the same, its continuing to bother me/using aspercreme and heat    HPI  Angela House is a 83 y.o. female who is having more midline lower back pain.  She has more days where it hurts.  She has pain bending over.  She has no trauma, no spasm, no weakness.  She cannot get comfortable some days.  She has tried Tylenol, rest, heat and ice. She cannot take NSAIDs as she is on anticoagulation.  She has tried various exercises with no help.   Body mass index is 32.14 kg/m.  ROS  Review of Systems  All other systems reviewed and are negative.  The following is a summary of the past history medically, past history surgically, known current medicines, social history and family history.  This information is gathered electronically by the computer from prior information and documentation.  I review this each visit and have found including this information at this point in the chart is beneficial and informative.    Past Medical History:  Diagnosis Date  . Arthritis   . Hypertension   . PE (pulmonary thromboembolism) ([place])    bilaterally, 03/2017  . Restless leg syndrome     Past Surgical History:  Procedure Laterality Date  . ABDOMINAL HYSTERECTOMY    . CHOLECYSTECTOMY    . EYE SURGERY    . KNEE SURGERY    . WRIST SURGERY      Family History  Problem Relation Age of Onset  . Arthritis Mother   . Hypertension Mother   . Hypertension Sister   . Pneumonia Brother   . Hypertension Sister   . Kidney disease Sister   . Colon cancer Neg Hx     Social History Social History   Tobacco Use  . Smoking status: Never Smoker  . Smokeless tobacco: Never Used  Substance Use Topics  . Alcohol use: No    Alcohol/week: 0.0 standard drinks  . Drug use: No    Allergies  Allergen Reactions  . Ciprofloxacin Hives  . Meclizine Other (See Comments)     dizziness    Current Outpatient Medications  Medication Sig Dispense Refill  . acetaminophen (TYLENOL) 325 MG tablet Take 325 mg by mouth every 6 (six) hours as needed.    Marland Kitchen acetaminophen (TYLENOL) 500 MG tablet Take 500 mg by mouth daily as needed for pain.     Marland Kitchen apixaban (ELIQUIS) 5 MG TABS tablet Take 10mg  po bid for 7 days then 5mg  po bid (Patient taking differently: Take 5 mg by mouth 2 (two) times daily. ) 60 tablet 1  . ASPERCREME W/LIDOCAINE EX Apply 1 application topically daily as needed (FOR HIP/KNEE PAIN).     Marland Kitchen calcium carbonate (OS-CAL - DOSED IN MG OF ELEMENTAL CALCIUM) 1250 (500 Ca) MG tablet Take 1 tablet by mouth 2 (two) times daily.     . Carboxymethylcellulose Sodium (THERATEARS) 0.25 % SOLN Apply 1 drop to eye as needed.     . cetirizine (ZYRTEC) 10 MG tablet Take 10 mg by mouth daily as needed.     . clobetasol (TEMOVATE) 0.05 % external solution Apply 1 application topically daily. Applied to scalp    . clobetasol cream (TEMOVATE) AB-123456789 % Apply 1 application topically daily as needed (FOR RASH).     Marland Kitchen cycloSPORINE (RESTASIS) 0.05 % ophthalmic emulsion Place 1 drop into both  eyes 2 (two) times daily.    . diazepam (VALIUM) 2 MG tablet Take 2 mg by mouth every 6 (six) hours as needed for anxiety.    . famotidine (PEPCID) 20 MG tablet Take 20 mg by mouth daily.  2  . famotidine (PEPCID) 20 MG tablet Take 1 tablet by mouth twice daily 60 tablet 5  . fluticasone (FLONASE) 50 MCG/ACT nasal spray Place 2 sprays into the nose daily as needed for allergies or rhinitis.     . furosemide (LASIX) 20 MG tablet Take by mouth as needed.     . gabapentin (NEURONTIN) 100 MG capsule Take 100 mg by mouth 2 (two) times daily.     Marland Kitchen ibuprofen (ADVIL,MOTRIN) 400 MG tablet Take 1 tablet (400 mg total) by mouth every 6 (six) hours as needed. (Patient not taking: Reported on 03/25/2018) 30 tablet 0  . Multiple Vitamin (MULTIVITAMIN) capsule Take 1 capsule by mouth every morning.     .  pantoprazole (PROTONIX) 40 MG tablet TAKE 1 TABLET BY MOUTH ONCE DAILY (Patient not taking: Reported on 02/19/2018) 90 tablet 3  . Potassium 99 MG TABS Take 1 tablet by mouth every other day.     . predniSONE (STERAPRED UNI-PAK 21 TAB) 5 MG (21) TBPK tablet Take 6 pills first day; 5 pills second day; 4 pills third day; 3 pills fourth day; 2 pills next day and 1 pill last day. 21 tablet 0  . sulfamethoxazole-trimethoprim (BACTRIM DS,SEPTRA DS) 800-160 MG tablet Take 1 tablet by mouth 2 (two) times daily.    Marland Kitchen tobramycin (TOBREX) 0.3 % ophthalmic solution Place 2 drops into the left eye 4 (four) times daily. (Patient not taking: Reported on 03/25/2018) 5 mL 0  . VENTOLIN HFA 108 (90 Base) MCG/ACT inhaler Inhale 1-2 puffs into the lungs every 6 (six) hours as needed.      No current facility-administered medications for this visit.      Physical Exam  Blood pressure (!) 145/106, pulse 74, height 5\' 5"  (1.651 m), weight 193 lb 2 oz (87.6 kg).  Constitutional: overall normal hygiene, normal nutrition, well developed, normal grooming, normal body habitus. Assistive device:none  Musculoskeletal: gait and station Limp none, muscle tone and strength are normal, no tremors or atrophy is present.  .  Neurological: coordination overall normal.  Deep tendon reflex/nerve stretch intact.  Sensation normal.  Cranial nerves II-XII intact.   Skin:   Normal overall no scars, lesions, ulcers or rashes. No psoriasis.  Psychiatric: Alert and oriented x 3.  Recent memory intact, remote memory unclear.  Normal mood and affect. Well groomed.  Good eye contact.  Cardiovascular: overall no swelling, no varicosities, no edema bilaterally, normal temperatures of the legs and arms, no clubbing, cyanosis and good capillary refill.  Spine/Pelvis examination:  Inspection:  Overall, sacoiliac joint benign and hips nontender; without crepitus or defects.   Thoracic spine inspection: Alignment normal without kyphosis  present   Lumbar spine inspection:  Alignment  with normal lumbar lordosis, without scoliosis apparent.   Thoracic spine palpation:  without tenderness of spinal processes   Lumbar spine palpation: without tenderness of lumbar area; without tightness of lumbar muscles    Range of Motion:   Lumbar flexion, forward flexion is normal without pain or tenderness    Lumbar extension is full without pain or tenderness   Left lateral bend is normal without pain or tenderness   Right lateral bend is normal without pain or tenderness   Straight leg raising is  normal  Strength & tone: normal   Stability overall normal stability  Lymphatic: palpation is normal.  All other systems reviewed and are negative   The patient has been educated about the nature of the problem(s) and counseled on treatment options.  The patient appeared to understand what I have discussed and is in agreement with it.  Encounter Diagnoses  Name Primary?  . Chronic midline low back pain without sciatica Yes  . Adequate anticoagulation on anticoagulant therapy    X-rays were done of the lumbar spine, reported separately.  She has significant DJD of the lumbar spine with slight left scoliosis apex, DJD of L4 on L5 more on the left, osteophyte formation,narrowing of L3 on L4 and L4 on L5.  No fracture noted.  PLAN Call if any problems.  Precautions discussed.  Continue current medications.   I will get MRI of the lumbar spine as she has not improved with conservative therapy.  Return to clinic 2 weeks   Electronically Signed Sanjuana Kava, MD 9/16/20209:34 AM

## 2019-03-18 NOTE — Addendum Note (Signed)
Addended by: Derek Mound A on: 03/18/2019 01:35 PM   Modules accepted: Orders

## 2019-03-23 ENCOUNTER — Telehealth: Payer: Self-pay | Admitting: Orthopaedic Surgery

## 2019-03-23 NOTE — Telephone Encounter (Signed)
Appointment for 04/07/19 canceled because she is not scheduled for her MRI until 04/14/19.  She said she didn't want to reschedule appointment yet because they told her they would call her sooner if they had a cancellation.  She will call us when she goes for MRI and we will schedule her then.

## 2019-04-01 DIAGNOSIS — Z01419 Encounter for gynecological examination (general) (routine) without abnormal findings: Secondary | ICD-10-CM | POA: Diagnosis not present

## 2019-04-07 ENCOUNTER — Ambulatory Visit: Payer: Medicare Other | Admitting: Orthopaedic Surgery

## 2019-04-14 ENCOUNTER — Other Ambulatory Visit: Payer: Self-pay

## 2019-04-14 ENCOUNTER — Ambulatory Visit
Admission: RE | Admit: 2019-04-14 | Discharge: 2019-04-14 | Disposition: A | Discharge status: Home / Self Care | Payer: Medicare Other | Source: Ambulatory Visit | Attending: Orthopaedic Surgery | Admitting: Orthopaedic Surgery

## 2019-04-14 DIAGNOSIS — M48061 Spinal stenosis, lumbar region without neurogenic claudication: Secondary | ICD-10-CM | POA: Diagnosis not present

## 2019-04-14 DIAGNOSIS — G8929 Other chronic pain: Secondary | ICD-10-CM

## 2019-04-21 ENCOUNTER — Ambulatory Visit (INDEPENDENT_AMBULATORY_CARE_PROVIDER_SITE_OTHER): Payer: Medicare Other | Admitting: Orthopaedic Surgery

## 2019-04-21 ENCOUNTER — Other Ambulatory Visit: Payer: Self-pay | Admitting: Family Medicine

## 2019-04-21 ENCOUNTER — Encounter: Payer: Self-pay | Admitting: Orthopaedic Surgery

## 2019-04-21 ENCOUNTER — Other Ambulatory Visit: Payer: Self-pay

## 2019-04-21 ENCOUNTER — Other Ambulatory Visit: Payer: Self-pay | Admitting: Orthopaedic Surgery

## 2019-04-21 VITALS — BP 121/69 | HR 64 | Ht 65.0 in | Wt 193.0 lb

## 2019-04-21 DIAGNOSIS — L910 Hypertrophic scar: Secondary | ICD-10-CM | POA: Diagnosis not present

## 2019-04-21 DIAGNOSIS — Z7901 Long term (current) use of anticoagulants: Secondary | ICD-10-CM

## 2019-04-21 DIAGNOSIS — G8929 Other chronic pain: Secondary | ICD-10-CM

## 2019-04-21 DIAGNOSIS — M545 Low back pain: Secondary | ICD-10-CM | POA: Diagnosis not present

## 2019-04-21 DIAGNOSIS — L639 Alopecia areata, unspecified: Secondary | ICD-10-CM | POA: Diagnosis not present

## 2019-04-21 DIAGNOSIS — L280 Lichen simplex chronicus: Secondary | ICD-10-CM | POA: Diagnosis not present

## 2019-04-21 NOTE — Progress Notes (Signed)
Primary Care Physician:  Monico Blitz, MD  Primary GI: Dr. Gala Romney   Chief Complaint  Patient presents with  . Follow-up    Gerd,having trouble tolerating vegetables    HPI:   Angela House is an 83 y.o. female presenting today with a history of chronic GERD. Protonix caused rash. Pepcid at last appt a year ago. Here for 1 year follow-up.   Difficulty with veggies specifically. Uses butter and crisco oil to cook. Has chest discomfort in middle of night. Will take an Copywriter, advertising. Will eat a snack or ice cream at night and tries to avoid eating anything heavier. Not waiting 2-3 hours before laying down. Will happen during the day. Feels like someone struck a Orthoptist. No SOB with this.   Taking Pepcid once a day for awhile now. Remote history of EGD at an outside facility. Was told had a hernia. No dysphagia.   Past Medical History:  Diagnosis Date  . Arthritis   . Hypertension   . PE (pulmonary thromboembolism) ([place])    bilaterally, 03/2017  . Restless leg syndrome     Past Surgical History:  Procedure Laterality Date  . ABDOMINAL HYSTERECTOMY    . CHOLECYSTECTOMY    . EYE SURGERY    . KNEE SURGERY    . WRIST SURGERY      Current Outpatient Medications  Medication Sig Dispense Refill  . acetaminophen (TYLENOL) 325 MG tablet Take 325 mg by mouth every 6 (six) hours as needed.    Marland Kitchen apixaban (ELIQUIS) 5 MG TABS tablet Take 10mg  po bid for 7 days then 5mg  po bid (Patient taking differently: Take 2.5 mg by mouth 2 (two) times daily. Patient states this was cut down from 5mg  to 2.5mg ) 60 tablet 1  . ASPERCREME W/LIDOCAINE EX Apply 1 application topically daily as needed (FOR HIP/KNEE PAIN).     Marland Kitchen calcium carbonate (OS-CAL - DOSED IN MG OF ELEMENTAL CALCIUM) 1250 (500 Ca) MG tablet Take 2 tablets by mouth 2 (two) times daily.     . Carboxymethylcellulose Sodium (THERATEARS) 0.25 % SOLN Apply 1 drop to eye as needed.     . cetirizine (ZYRTEC) 10 MG tablet Take 10 mg by mouth daily  as needed.     . clobetasol (TEMOVATE) 0.05 % external solution Apply 1 application topically as needed. Applied to scalp    . clobetasol cream (TEMOVATE) AB-123456789 % Apply 1 application topically daily as needed (FOR RASH).     Marland Kitchen clotrimazole-betamethasone (LOTRISONE) cream as needed.     . cycloSPORINE (RESTASIS) 0.05 % ophthalmic emulsion Place 1 drop into both eyes 2 (two) times daily.    . diazepam (VALIUM) 2 MG tablet Take 2 mg by mouth as needed for anxiety.     . famotidine (PEPCID) 20 MG tablet Take 1 tablet by mouth twice daily (Patient taking differently: daily. ) 60 tablet 5  . fluticasone (FLONASE) 50 MCG/ACT nasal spray Place 2 sprays into the nose as needed for allergies or rhinitis.     . furosemide (LASIX) 20 MG tablet Take by mouth as needed.     . gabapentin (NEURONTIN) 100 MG capsule Take 100 mg by mouth 2 (two) times daily.     . Multiple Vitamin (MULTIVITAMIN) capsule Take 1 capsule by mouth every morning.     [name] (SYSTANE) 0.4-0.3 % SOLN Apply 1 drop to eye 4 (four) times daily.    . Potassium 99 MG TABS Take 1 tablet  by mouth every other day.     . VENTOLIN HFA 108 (90 Base) MCG/ACT inhaler Inhale 1-2 puffs into the lungs every 6 (six) hours as needed.     Marland Kitchen ibuprofen (ADVIL,MOTRIN) 400 MG tablet Take 1 tablet (400 mg total) by mouth every 6 (six) hours as needed. (Patient not taking: Reported on 04/22/2019) 30 tablet 0  . pantoprazole (PROTONIX) 40 MG tablet TAKE 1 TABLET BY MOUTH ONCE DAILY (Patient not taking: Reported on 04/22/2019) 90 tablet 3   No current facility-administered medications for this visit.     Allergies as of 04/22/2019 - Review Complete 04/22/2019  Allergen Reaction Noted  . Ciprofloxacin Hives 09/21/2015  . Meclizine Other (See Comments) 09/21/2015    Family History  Problem Relation Age of Onset  . Arthritis Mother   . Hypertension Mother   . Hypertension Sister   . Pneumonia Brother   . Hypertension Sister   .  Kidney disease Sister   . Colon cancer Neg Hx     Social History   Socioeconomic History  . Marital status: Widowed    Spouse name: Not on file  . Number of children: Not on file  . Years of education: Not on file  . Highest education level: Not on file  Occupational History  . Not on file  Social Needs  . Financial resource strain: Not on file  . Food insecurity    Worry: Not on file    Inability: Not on file  . Transportation needs    Medical: Not on file    Non-medical: Not on file  Tobacco Use  . Smoking status: Never Smoker  . Smokeless tobacco: Never Used  Substance and Sexual Activity  . Alcohol use: No    Alcohol/week: 0.0 standard drinks  . Drug use: No  . Sexual activity: Not on file  Lifestyle  . Physical activity    Days per week: Not on file    Minutes per session: Not on file  . Stress: Not on file  Relationships  . Social Herbalist on phone: Not on file    Gets together: Not on file    Attends religious service: Not on file    Active member of club or organization: Not on file    Attends meetings of clubs or organizations: Not on file    Relationship status: Not on file  Other Topics Concern  . Not on file  Social History Narrative  . Not on file    Review of Systems: Gen: Denies fever, chills, anorexia. Denies fatigue, weakness, weight loss.  CV: Denies chest pain, palpitations, syncope, peripheral edema, and claudication. Resp: Denies dyspnea at rest, cough, wheezing, coughing up blood, and pleurisy. GI: see HPI Derm: Denies rash, itching, dry skin Psych: Denies depression, anxiety, memory loss, confusion. No homicidal or suicidal ideation.  Heme: Denies bruising, bleeding, and enlarged lymph nodes.  Physical Exam: BP 129/74   Pulse 76   Temp (!) 97.5 F (36.4 C)   Ht 5\' 5"  (1.651 m)   Wt 196 lb 12.8 oz (89.3 kg)   BMI 32.75 kg/m  General:   Alert and oriented. No distress noted. Pleasant and cooperative.  Head:   Normocephalic and atraumatic. Abdomen:  +BS, soft, non-tender and non-distended. No rebound or guarding. No HSM or masses noted. Msk:  Symmetrical without gross deformities. Normal posture. Extremities:  Without edema. Neurologic:  Alert and  oriented x4 Psych:  Alert and cooperative. Normal mood and affect.  ASSESSMENT: 83 year old female with history of chronic GERD, unable to take Protonix due to rash; however, she has historically done well with Pepcid BID. Presenting now with chest burning after eating late, nocturnal symptoms, and some daytime symptoms while taking just once daily. No dysphagia. Discussed dietary and behavior modifications in this setting, as she is prone to eating late and cooking with fat/oils. GERD diet provided, increase Pepcid to BID, and follow-up in 2-3 months. We have also discussed cardiology evaluation to ensure no underlying etiology. I doubt this is the case, but we will pursue this for completeness' sake.    PLAN:  Increase Pepcid back to BID GERD diet/behavior modifications Cardiology Referral Return in 2-3 months   Annitta Needs, PhD, [facility]

## 2019-04-21 NOTE — Progress Notes (Signed)
Patient Angela House E Kauzlarich, female DOB:[birth year], 83 y.o. [name]  Chief Complaint  Patient presents with  . Back Pain    worse in the am with cooler weather  . Results    review MRI     HPI  Angela House is a 83 y.o. female who has lower back pain. She had MRI which showed: IMPRESSION: 1. Moderately severe left facet arthritis at L4-5 with a small broad-based disc protrusion asymmetric to the left which could affect the left L5 nerve. 2. Moderate right facet arthritis at L3-4 with a slight mass effect upon the right L3 nerve lateral to the neural foramen.  I have recommended epidural. I will set this up.  She is on blood thinner and will have to stop that first before the procedure.   Body mass index is 32.12 kg/m.  ROS  Review of Systems  HENT: Negative for congestion.   Respiratory: Negative for cough and shortness of breath.   Cardiovascular: Negative for chest pain and leg swelling.  Gastrointestinal: Positive for abdominal pain.  Endocrine: Positive for cold intolerance.  Musculoskeletal: Positive for arthralgias, back pain and gait problem.  Allergic/Immunologic: Positive for environmental allergies.  All other systems reviewed and are negative.   All other systems reviewed and are negative.  The following is a summary of the past history medically, past history surgically, known current medicines, social history and family history.  This information is gathered electronically by the computer from prior information and documentation.  I review this each visit and have found including this information at this point in the chart is beneficial and informative.    Past Medical History:  Diagnosis Date  . Arthritis   . Hypertension   . PE (pulmonary thromboembolism) ([place])    bilaterally, 03/2017  . Restless leg syndrome     Past Surgical History:  Procedure Laterality Date  . ABDOMINAL HYSTERECTOMY    . CHOLECYSTECTOMY    . EYE SURGERY    . KNEE  SURGERY    . WRIST SURGERY      Family History  Problem Relation Age of Onset  . Arthritis Mother   . Hypertension Mother   . Hypertension Sister   . Pneumonia Brother   . Hypertension Sister   . Kidney disease Sister   . Colon cancer Neg Hx     Social History Social History   Tobacco Use  . Smoking status: Never Smoker  . Smokeless tobacco: Never Used  Substance Use Topics  . Alcohol use: No    Alcohol/week: 0.0 standard drinks  . Drug use: No    Allergies  Allergen Reactions  . Ciprofloxacin Hives  . Meclizine Other (See Comments)    dizziness    Current Outpatient Medications  Medication Sig Dispense Refill  . acetaminophen (TYLENOL) 325 MG tablet Take 325 mg by mouth every 6 (six) hours as needed.    Marland Kitchen apixaban (ELIQUIS) 5 MG TABS tablet Take 10mg  po bid for 7 days then 5mg  po bid (Patient taking differently: Take 2.5 mg by mouth 2 (two) times daily. Patient states this was cut down from 5mg  to 2.5mg ) 60 tablet 1  . ASPERCREME W/LIDOCAINE EX Apply 1 application topically daily as needed (FOR HIP/KNEE PAIN).     Marland Kitchen calcium carbonate (OS-CAL - DOSED IN MG OF ELEMENTAL CALCIUM) 1250 (500 Ca) MG tablet Take 1 tablet by mouth 2 (two) times daily.     . Carboxymethylcellulose Sodium (THERATEARS) 0.25 % SOLN Apply 1 drop to  eye as needed.     . cetirizine (ZYRTEC) 10 MG tablet Take 10 mg by mouth daily as needed.     . clobetasol (TEMOVATE) 0.05 % external solution Apply 1 application topically daily. Applied to scalp    . clobetasol cream (TEMOVATE) AB-123456789 % Apply 1 application topically daily as needed (FOR RASH).     Marland Kitchen clotrimazole-betamethasone (LOTRISONE) cream APPLY CREAM TOPICALLY TO AFFECTED AREA TWICE DAILY    . cycloSPORINE (RESTASIS) 0.05 % ophthalmic emulsion Place 1 drop into both eyes 2 (two) times daily.    . famotidine (PEPCID) 20 MG tablet Take 1 tablet by mouth twice daily 60 tablet 5  . fluticasone (FLONASE) 50 MCG/ACT nasal spray Place 2 sprays into the  nose daily as needed for allergies or rhinitis.     . furosemide (LASIX) 20 MG tablet Take by mouth as needed.     . gabapentin (NEURONTIN) 100 MG capsule Take 100 mg by mouth 2 (two) times daily.     Marland Kitchen ibuprofen (ADVIL,MOTRIN) 400 MG tablet Take 1 tablet (400 mg total) by mouth every 6 (six) hours as needed. 30 tablet 0  . Multiple Vitamin (MULTIVITAMIN) capsule Take 1 capsule by mouth every morning.     . pantoprazole (PROTONIX) 40 MG tablet TAKE 1 TABLET BY MOUTH ONCE DAILY 90 tablet 3  . Potassium 99 MG TABS Take 1 tablet by mouth every other day.     . VENTOLIN HFA 108 (90 Base) MCG/ACT inhaler Inhale 1-2 puffs into the lungs every 6 (six) hours as needed.     . diazepam (VALIUM) 2 MG tablet Take 2 mg by mouth every 6 (six) hours as needed for anxiety.     No current facility-administered medications for this visit.      Physical Exam  Blood pressure 121/69, pulse 64, height 5\' 5"  (1.651 m), weight 193 lb (87.5 kg).  Constitutional: overall normal hygiene, normal nutrition, well developed, normal grooming, normal body habitus. Assistive device:none  Musculoskeletal: gait and station Limp none, muscle tone and strength are normal, no tremors or atrophy is present.  .  Neurological: coordination overall normal.  Deep tendon reflex/nerve stretch intact.  Sensation normal.  Cranial nerves II-XII intact.   Skin:   Normal overall no scars, lesions, ulcers or rashes. No psoriasis.  Psychiatric: Alert and oriented x 3.  Recent memory intact, remote memory unclear.  Normal mood and affect. Well groomed.  Good eye contact.  Cardiovascular: overall no swelling, no varicosities, no edema bilaterally, normal temperatures of the legs and arms, no clubbing, cyanosis and good capillary refill.  Lymphatic: palpation is normal.  Spine/Pelvis examination:  Inspection:  Overall, sacoiliac joint benign and hips nontender; without crepitus or defects.   Thoracic spine inspection: Alignment normal  without kyphosis present   Lumbar spine inspection:  Alignment  with normal lumbar lordosis, without scoliosis apparent.   Thoracic spine palpation:  without tenderness of spinal processes   Lumbar spine palpation: without tenderness of lumbar area; without tightness of lumbar muscles    Range of Motion:   Lumbar flexion, forward flexion is normal without pain or tenderness    Lumbar extension is full without pain or tenderness   Left lateral bend is normal without pain or tenderness   Right lateral bend is normal without pain or tenderness   Straight leg raising is normal  Strength & tone: normal   Stability overall normal stability  All other systems reviewed and are negative   The patient has  been educated about the nature of the problem(s) and counseled on treatment options.  The patient appeared to understand what I have discussed and is in agreement with it.  Encounter Diagnoses  Name Primary?  . Chronic midline low back pain without sciatica Yes  . Adequate anticoagulation on anticoagulant therapy     PLAN Call if any problems.  Precautions discussed.  Continue current medications.   Return to clinic 6 weeks   Get epidurals.  Electronically Signed Sanjuana Kava, MD 10/20/202010:06 AM

## 2019-04-22 ENCOUNTER — Ambulatory Visit (INDEPENDENT_AMBULATORY_CARE_PROVIDER_SITE_OTHER): Payer: Medicare Other | Admitting: Gastroenterology

## 2019-04-22 ENCOUNTER — Encounter: Payer: Self-pay | Admitting: Gastroenterology

## 2019-04-22 ENCOUNTER — Other Ambulatory Visit: Payer: Self-pay

## 2019-04-22 VITALS — BP 129/74 | HR 76 | Temp 97.5°F | Ht 65.0 in | Wt 196.8 lb

## 2019-04-22 DIAGNOSIS — K219 Gastro-esophageal reflux disease without esophagitis: Secondary | ICD-10-CM | POA: Diagnosis not present

## 2019-04-22 DIAGNOSIS — R079 Chest pain, unspecified: Secondary | ICD-10-CM

## 2019-04-22 MED ORDER — FAMOTIDINE 20 MG PO TABS: 20.0000 mg | ORAL_TABLET | Freq: Two times a day (BID) | ORAL | 5 refills | Status: DC

## 2019-04-22 NOTE — Patient Instructions (Addendum)
I have increased Pepcid to twice a day.  Please avoid cooking with butter, oil, etc. I have included a handout for this.   If the burning continues, please call us!  I am referring you to Cardiology just to make sure all is well.  We will see you in 2-3 months!  It was a pleasure to see you today. I want to create trusting relationships with patients to provide genuine, compassionate, and quality care. I value your feedback. If you receive a survey regarding your visit,  I greatly appreciate you taking time to fill this out.   Annitta Needs, PhD, ANP-BC [facility]    Food Choices for Gastroesophageal Reflux Disease, Adult When you have gastroesophageal reflux disease (GERD), the foods you eat and your eating habits are very important. Choosing the right foods can help ease the discomfort of GERD. Consider working with a diet and nutrition specialist (dietitian) to help you make healthy food choices. What general guidelines should I follow?  Eating plan  Choose healthy foods low in fat, such as fruits, vegetables, whole grains, low-fat dairy products, and lean meat, fish, and poultry.  Eat frequent, small meals instead of three large meals each day. Eat your meals slowly, in a relaxed setting. Avoid bending over or lying down until 2-3 hours after eating.  Limit high-fat foods such as fatty meats or fried foods.  Limit your intake of oils, butter, and shortening to less than 8 teaspoons each day.  Avoid the following: ? Foods that cause symptoms. These may be different for different people. Keep a food diary to keep track of foods that cause symptoms. ? Alcohol. ? Drinking large amounts of liquid with meals. ? Eating meals during the 2-3 hours before bed.  Cook foods using methods other than frying. This may include baking, grilling, or broiling. Lifestyle  Maintain a healthy weight. Ask your health care provider what weight is healthy for you. If you need to  lose weight, work with your health care provider to do so safely.  Exercise for at least 30 minutes on 5 or more days each week, or as told by your health care provider.  Avoid wearing clothes that fit tightly around your waist and chest.  Do not use any products that contain nicotine or tobacco, such as cigarettes and e-cigarettes. If you need help quitting, ask your health care provider.  Sleep with the head of your bed raised. Use a wedge under the mattress or blocks under the bed frame to raise the head of the bed. What foods are not recommended? The items listed may not be a complete list. Talk with your dietitian about what dietary choices are best for you. Grains Pastries or quick breads with added fat. [place] toast. Vegetables Deep fried vegetables. [place] fries. Any vegetables prepared with added fat. Any vegetables that cause symptoms. For some people this may include tomatoes and tomato products, chili peppers, onions and garlic, and horseradish. Fruits Any fruits prepared with added fat. Any fruits that cause symptoms. For some people this may include citrus fruits, such as oranges, grapefruit, pineapple, and lemons. Meats and other protein foods High-fat meats, such as fatty beef or pork, hot dogs, ribs, ham, sausage, salami and bacon. Fried meat or protein, including fried fish and fried chicken. Nuts and nut butters. Dairy Whole milk and chocolate milk. Sour cream. Cream. Ice cream. Cream cheese. Milk shakes. Beverages Coffee and tea, with or without caffeine. Carbonated beverages. Sodas. Energy drinks. Fruit juice  made with acidic fruits (such as orange or grapefruit). Tomato juice. Alcoholic drinks. Fats and oils Butter. Margarine. Shortening. Ghee. Sweets and desserts Chocolate and cocoa. Donuts. Seasoning and other foods Pepper. Peppermint and spearmint. Any condiments, herbs, or seasonings that cause symptoms. For some people, this may include curry, hot sauce, or  vinegar-based salad dressings. Summary  When you have gastroesophageal reflux disease (GERD), food and lifestyle choices are very important to help ease the discomfort of GERD.  Eat frequent, small meals instead of three large meals each day. Eat your meals slowly, in a relaxed setting. Avoid bending over or lying down until 2-3 hours after eating.  Limit high-fat foods such as fatty meat or fried foods. This information is not intended to replace advice given to you by your health care provider. Make sure you discuss any questions you have with your health care provider. Document Released: 06/18/2005 Document Revised: 10/09/2018 Document Reviewed: 06/19/2016 Elsevier Patient Education  2020 [employer].

## 2019-04-24 ENCOUNTER — Encounter: Payer: Self-pay | Admitting: Cardiovascular Disease

## 2019-04-24 ENCOUNTER — Telehealth: Payer: Self-pay | Admitting: Cardiovascular Disease

## 2019-04-24 ENCOUNTER — Encounter: Payer: Self-pay | Admitting: *Deleted

## 2019-04-24 ENCOUNTER — Ambulatory Visit (INDEPENDENT_AMBULATORY_CARE_PROVIDER_SITE_OTHER): Payer: Medicare Other | Admitting: Cardiovascular Disease

## 2019-04-24 ENCOUNTER — Other Ambulatory Visit: Payer: Self-pay

## 2019-04-24 VITALS — BP 118/68 | HR 71 | Ht 64.0 in | Wt 194.0 lb

## 2019-04-24 DIAGNOSIS — I10 Essential (primary) hypertension: Secondary | ICD-10-CM

## 2019-04-24 DIAGNOSIS — R079 Chest pain, unspecified: Secondary | ICD-10-CM

## 2019-04-24 DIAGNOSIS — I2699 Other pulmonary embolism without acute cor pulmonale: Secondary | ICD-10-CM

## 2019-04-24 DIAGNOSIS — K219 Gastro-esophageal reflux disease without esophagitis: Secondary | ICD-10-CM

## 2019-04-24 NOTE — Telephone Encounter (Signed)
Pre-cert Verification for the following procedure    lexiscan - chest pain scheduled for 04-27-2019 at [facility]

## 2019-04-24 NOTE — Progress Notes (Signed)
CARDIOLOGY CONSULT NOTE  Patient ID: Angela House MRN: [id number] DOB/AGE: [birth year] 83 y.o.  Admit date: (Not on file) Primary Physician: Monico Blitz, MD  Reason for Consultation: Chest pain  HPI: Angela House is a 83 y.o. female who is being seen today for the evaluation of chest pain at the request of Annitta Needs, NP.   Past medical history includes pulmonary embolism in September 2018.  She has a history of chronic GERD and follows with GI whom she saw on 04/22/2019.  I personally reviewed the chest CT performed on 07/30/2017 which showed no acute pulmonary embolus.  There was small residual chronic pulmonary embolus in the segmental branch of the right lower lobe.    There were no coronary artery calcifications.  Echocardiogram on 03/05/2017 demonstrated normal LV systolic and diastolic function and normal regional wall motion, EF 65 to 70%.  I personally reviewed ECG performed today which demonstrates sinus rhythm with T wave inversions inferiorly and V3 through V6.  These are more pronounced when compared to ECG performed on 12/14/2017 which I reviewed.  She has a burning chest pain in the lower central part of her chest which primarily occurs at night but can also occur during the daytime.  She has not noticed it with exertion.  She has occasional shortness of breath alleviated with an inhaler.  She does mention that her energy levels have significantly declined over the past 6 to 12 months.  She also has chronic back pain and follows with an orthopedic surgeon.  She denies palpitations, orthopnea, and paroxysmal nocturnal dyspnea.  She had been following with a cardiologist in [place] but she cannot recall the date.  She underwent a cardiac catheterization over 15 years ago.   Allergies  Allergen Reactions  . Ciprofloxacin Hives  . Meclizine Other (See Comments)    dizziness    Current Outpatient Medications  Medication Sig Dispense Refill  .  acetaminophen (TYLENOL) 325 MG tablet Take 325 mg by mouth every 6 (six) hours as needed.    Marland Kitchen apixaban (ELIQUIS) 2.5 MG TABS tablet Take 2.5 mg by mouth 2 (two) times daily.    . ASPERCREME W/LIDOCAINE EX Apply 1 application topically daily as needed (FOR HIP/KNEE PAIN).     Marland Kitchen calcium carbonate (OS-CAL - DOSED IN MG OF ELEMENTAL CALCIUM) 1250 (500 Ca) MG tablet Take 2 tablets by mouth 2 (two) times daily.     . Carboxymethylcellulose Sodium (THERATEARS) 0.25 % SOLN Apply 1 drop to eye as needed.     . cetirizine (ZYRTEC) 10 MG tablet Take 10 mg by mouth daily as needed.     . clobetasol (TEMOVATE) 0.05 % external solution Apply 1 application topically as needed. Applied to scalp    . clobetasol cream (TEMOVATE) AB-123456789 % Apply 1 application topically daily as needed (FOR RASH).     Marland Kitchen clotrimazole-betamethasone (LOTRISONE) cream as needed.     . cycloSPORINE (RESTASIS) 0.05 % ophthalmic emulsion Place 1 drop into both eyes 2 (two) times daily.    . diazepam (VALIUM) 2 MG tablet Take 2 mg by mouth as needed for anxiety.     . famotidine (PEPCID) 20 MG tablet Take 1 tablet (20 mg total) by mouth 2 (two) times daily. 60 tablet 5  . fluticasone (FLONASE) 50 MCG/ACT nasal spray Place 2 sprays into the nose as needed for allergies or rhinitis.     . furosemide (LASIX) 20 MG tablet Take by mouth as  needed.     . gabapentin (NEURONTIN) 100 MG capsule Take 100 mg by mouth 2 (two) times daily.     . Multiple Vitamin (MULTIVITAMIN) capsule Take 1 capsule by mouth every morning.     [name] (SYSTANE) 0.4-0.3 % SOLN Apply 1 drop to eye 4 (four) times daily.    . Potassium 99 MG TABS Take 1 tablet by mouth every other day.     . VENTOLIN HFA 108 (90 Base) MCG/ACT inhaler Inhale 1-2 puffs into the lungs every 6 (six) hours as needed.      No current facility-administered medications for this visit.     Past Medical History:  Diagnosis Date  . Arthritis   . Hypertension   . PE  (pulmonary thromboembolism) ([place])    bilaterally, 03/2017  . Restless leg syndrome     Past Surgical History:  Procedure Laterality Date  . ABDOMINAL HYSTERECTOMY    . CHOLECYSTECTOMY    . EYE SURGERY    . KNEE SURGERY    . WRIST SURGERY      Social History   Socioeconomic History  . Marital status: Widowed    Spouse name: Not on file  . Number of children: Not on file  . Years of education: Not on file  . Highest education level: Not on file  Occupational History  . Not on file  Social Needs  . Financial resource strain: Not on file  . Food insecurity    Worry: Not on file    Inability: Not on file  . Transportation needs    Medical: Not on file    Non-medical: Not on file  Tobacco Use  . Smoking status: Never Smoker  . Smokeless tobacco: Never Used  Substance and Sexual Activity  . Alcohol use: No    Alcohol/week: 0.0 standard drinks  . Drug use: No  . Sexual activity: Not on file  Lifestyle  . Physical activity    Days per week: Not on file    Minutes per session: Not on file  . Stress: Not on file  Relationships  . Social Herbalist on phone: Not on file    Gets together: Not on file    Attends religious service: Not on file    Active member of club or organization: Not on file    Attends meetings of clubs or organizations: Not on file    Relationship status: Not on file  . Intimate partner violence    Fear of current or ex partner: Not on file    Emotionally abused: Not on file    Physically abused: Not on file    Forced sexual activity: Not on file  Other Topics Concern  . Not on file  Social History Narrative  . Not on file     No family history of premature CAD in 1st degree relatives.  Current Meds  Medication Sig  . acetaminophen (TYLENOL) 325 MG tablet Take 325 mg by mouth every 6 (six) hours as needed.  Marland Kitchen apixaban (ELIQUIS) 2.5 MG TABS tablet Take 2.5 mg by mouth 2 (two) times daily.  . ASPERCREME W/LIDOCAINE EX Apply 1  application topically daily as needed (FOR HIP/KNEE PAIN).   Marland Kitchen calcium carbonate (OS-CAL - DOSED IN MG OF ELEMENTAL CALCIUM) 1250 (500 Ca) MG tablet Take 2 tablets by mouth 2 (two) times daily.   . Carboxymethylcellulose Sodium (THERATEARS) 0.25 % SOLN Apply 1 drop to eye as needed.   Marland Kitchen  cetirizine (ZYRTEC) 10 MG tablet Take 10 mg by mouth daily as needed.   . clobetasol (TEMOVATE) 0.05 % external solution Apply 1 application topically as needed. Applied to scalp  . clobetasol cream (TEMOVATE) AB-123456789 % Apply 1 application topically daily as needed (FOR RASH).   Marland Kitchen clotrimazole-betamethasone (LOTRISONE) cream as needed.   . cycloSPORINE (RESTASIS) 0.05 % ophthalmic emulsion Place 1 drop into both eyes 2 (two) times daily.  . diazepam (VALIUM) 2 MG tablet Take 2 mg by mouth as needed for anxiety.   . famotidine (PEPCID) 20 MG tablet Take 1 tablet (20 mg total) by mouth 2 (two) times daily.  . fluticasone (FLONASE) 50 MCG/ACT nasal spray Place 2 sprays into the nose as needed for allergies or rhinitis.   . furosemide (LASIX) 20 MG tablet Take by mouth as needed.   . gabapentin (NEURONTIN) 100 MG capsule Take 100 mg by mouth 2 (two) times daily.   . Multiple Vitamin (MULTIVITAMIN) capsule Take 1 capsule by mouth every morning.   [name] (SYSTANE) 0.4-0.3 % SOLN Apply 1 drop to eye 4 (four) times daily.  . Potassium 99 MG TABS Take 1 tablet by mouth every other day.   . VENTOLIN HFA 108 (90 Base) MCG/ACT inhaler Inhale 1-2 puffs into the lungs every 6 (six) hours as needed.       Review of systems complete and found to be negative unless listed above in HPI    Physical exam Blood pressure 118/68, pulse 71, height 5\' 4"  (1.626 m), weight 194 lb (88 kg), SpO2 98 %. General: NAD Neck: No JVD, no thyromegaly or thyroid nodule.  Lungs: Clear to auscultation bilaterally with normal respiratory effort. CV: Nondisplaced PMI. Regular rate and rhythm, normal S1/S2, no S3/S4, no  murmur.  No peripheral edema.  No carotid bruit.    Abdomen: Soft, midepigastric tenderness, no distention.  Skin: Intact without lesions or rashes.  Neurologic: Alert and oriented x 3.  Psych: Normal affect. Extremities: No clubbing or cyanosis.  HEENT: Normal.   ECG: Most recent ECG reviewed.   Labs: Lab Results  Component Value Date/Time   K 4.0 07/30/2017 07:50 PM   BUN 14 07/30/2017 07:50 PM   CREATININE 0.87 07/30/2017 07:50 PM   ALT 24 03/04/2017 08:01 PM   HGB 13.6 07/30/2017 07:50 PM     Lipids: No results found for: LDLCALC, LDLDIRECT, CHOL, TRIG, HDL      ASSESSMENT AND PLAN:  1.  Chest pain: No coronary artery calcification seen on chest CT in January 2019.  She does have GERD and had some mid epigastric tenderness on physical exam.  ECG is markedly abnormal. I will proceed with a nuclear myocardial perfusion imaging study to evaluate for ischemic heart disease (Lexiscan Myoview).  2.  History of pulmonary embolism: This was unprovoked.  She follows with hematology.  She is on low-dose apixaban.  3.  GERD: On Pepcid.  Sees GI.    Disposition: Follow up in 4 months  Signed: Kate Sable, M.D., F.A.C.C.  04/24/2019, 2:09 PM

## 2019-04-24 NOTE — Patient Instructions (Addendum)
Medication Instructions:  Continue all current medications.  Labwork: none   Testing/Procedures:  Your physician has requested that you have a lexiscan myoview. For further information please visit [email or web address]. Please follow instruction sheet, as given.  Office will contact with results via phone or letter.    Follow-Up: 4 months   Any Other Special Instructions Will Be Listed Below (If Applicable).  If you need a refill on your cardiac medications before your next appointment, please call your pharmacy.

## 2019-04-27 ENCOUNTER — Ambulatory Visit (HOSPITAL_COMMUNITY)
Admission: RE | Admit: 2019-04-27 | Discharge: 2019-04-27 | Disposition: A | Discharge status: Home / Self Care | Payer: Medicare Other | Source: Ambulatory Visit | Attending: Cardiovascular Disease | Admitting: Cardiovascular Disease

## 2019-04-27 ENCOUNTER — Encounter (HOSPITAL_COMMUNITY): Payer: Self-pay

## 2019-04-27 ENCOUNTER — Encounter (HOSPITAL_COMMUNITY)
Admission: RE | Admit: 2019-04-27 | Discharge: 2019-04-27 | Disposition: A | Discharge status: Home / Self Care | Payer: Medicare Other | Source: Ambulatory Visit | Attending: Cardiovascular Disease | Admitting: Cardiovascular Disease

## 2019-04-27 ENCOUNTER — Other Ambulatory Visit: Payer: Self-pay

## 2019-04-27 DIAGNOSIS — R079 Chest pain, unspecified: Secondary | ICD-10-CM | POA: Diagnosis not present

## 2019-04-27 LAB — NM MYOCAR MULTI W/SPECT W/WALL MOTION / EF
LV dias vol: 38 mL (ref 46–106)
LV sys vol: 6 mL
Peak HR: 93 {beats}/min
RATE: 0.34
Rest HR: 60 {beats}/min
SDS: 2
SRS: 3
SSS: 5
TID: 1

## 2019-04-27 MED ORDER — TECHNETIUM TC 99M TETROFOSMIN IV KIT
30.0000 | PACK | Freq: Once | INTRAVENOUS | Status: AC | PRN
  Administered 2019-04-27: 30.5 via INTRAVENOUS

## 2019-04-27 MED ORDER — REGADENOSON 0.4 MG/5ML IV SOLN
INTRAVENOUS | Status: AC
  Administered 2019-04-27: 0.4 mg via INTRAVENOUS
  Filled 2019-04-27: qty 5

## 2019-04-27 MED ORDER — TECHNETIUM TC 99M TETROFOSMIN IV KIT
10.0000 | PACK | Freq: Once | INTRAVENOUS | Status: AC | PRN
  Administered 2019-04-27: 10.26 via INTRAVENOUS

## 2019-04-27 MED ORDER — SODIUM CHLORIDE FLUSH 0.9 % IV SOLN
INTRAVENOUS | Status: AC
  Administered 2019-04-27: 10 mL via INTRAVENOUS
  Filled 2019-04-27: qty 10

## 2019-04-28 ENCOUNTER — Encounter: Payer: Self-pay | Admitting: *Deleted

## 2019-04-30 ENCOUNTER — Telehealth: Payer: Self-pay | Admitting: *Deleted

## 2019-04-30 NOTE — Telephone Encounter (Signed)
Notes recorded by Laurine Blazer, LPN on [id number] at 5:55 PM EDT  Patient notified. Copy to pmd.  ------   Notes recorded by Herminio Commons, MD on 04/27/2019 at 5:09 PM EDT  Normal

## 2019-05-01 DIAGNOSIS — Z1339 Encounter for screening examination for other mental health and behavioral disorders: Secondary | ICD-10-CM | POA: Diagnosis not present

## 2019-05-01 DIAGNOSIS — I10 Essential (primary) hypertension: Secondary | ICD-10-CM | POA: Diagnosis not present

## 2019-05-01 DIAGNOSIS — F419 Anxiety disorder, unspecified: Secondary | ICD-10-CM | POA: Diagnosis not present

## 2019-05-01 DIAGNOSIS — Z1211 Encounter for screening for malignant neoplasm of colon: Secondary | ICD-10-CM | POA: Diagnosis not present

## 2019-05-01 DIAGNOSIS — R5383 Other fatigue: Secondary | ICD-10-CM | POA: Diagnosis not present

## 2019-05-01 DIAGNOSIS — Z79899 Other long term (current) drug therapy: Secondary | ICD-10-CM | POA: Diagnosis not present

## 2019-05-01 DIAGNOSIS — Z7189 Other specified counseling: Secondary | ICD-10-CM | POA: Diagnosis not present

## 2019-05-01 DIAGNOSIS — Z1331 Encounter for screening for depression: Secondary | ICD-10-CM | POA: Diagnosis not present

## 2019-05-01 DIAGNOSIS — Z299 Encounter for prophylactic measures, unspecified: Secondary | ICD-10-CM | POA: Diagnosis not present

## 2019-05-01 DIAGNOSIS — Z0000 Encounter for general adult medical examination without abnormal findings: Secondary | ICD-10-CM | POA: Diagnosis not present

## 2019-05-01 DIAGNOSIS — E559 Vitamin D deficiency, unspecified: Secondary | ICD-10-CM | POA: Diagnosis not present

## 2019-05-01 DIAGNOSIS — E7800 Pure hypercholesterolemia, unspecified: Secondary | ICD-10-CM | POA: Diagnosis not present

## 2019-05-01 DIAGNOSIS — N183 Chronic kidney disease, stage 3 unspecified: Secondary | ICD-10-CM | POA: Diagnosis not present

## 2019-05-01 DIAGNOSIS — Z6832 Body mass index (BMI) 32.0-32.9, adult: Secondary | ICD-10-CM | POA: Diagnosis not present

## 2019-05-07 ENCOUNTER — Other Ambulatory Visit: Payer: Self-pay

## 2019-05-07 ENCOUNTER — Ambulatory Visit
Admission: RE | Admit: 2019-05-07 | Discharge: 2019-05-07 | Disposition: A | Discharge status: Home / Self Care | Payer: Medicare Other | Source: Ambulatory Visit | Attending: Orthopaedic Surgery | Admitting: Orthopaedic Surgery

## 2019-05-07 ENCOUNTER — Encounter (HOSPITAL_COMMUNITY): Payer: Medicare Other

## 2019-05-07 DIAGNOSIS — G8929 Other chronic pain: Secondary | ICD-10-CM

## 2019-05-07 DIAGNOSIS — M48061 Spinal stenosis, lumbar region without neurogenic claudication: Secondary | ICD-10-CM | POA: Diagnosis not present

## 2019-05-07 MED ORDER — METHYLPREDNISOLONE ACETATE 40 MG/ML INJ SUSP (RADIOLOG
120.0000 mg | Freq: Once | INTRAMUSCULAR | Status: AC
  Administered 2019-05-07: 120 mg via EPIDURAL

## 2019-05-07 MED ORDER — IOPAMIDOL (ISOVUE-M 200) INJECTION 41%
1.0000 mL | Freq: Once | INTRAMUSCULAR | Status: AC
  Administered 2019-05-07: 13:00:00 1 mL via EPIDURAL

## 2019-05-07 NOTE — Discharge Instructions (Signed)
Spinal Injection Discharge Instruction Sheet ° °1. You may resume a regular diet and any medications that you routinely take, including pain medications. ° °2. No driving the rest of the day of the procedure. ° °3. Light activity throughout the rest of the day.  Do not do any strenuous work, exercise, bending or lifting.  The day following the procedure, you may resume normal physical activity but you should refrain from exercising or physical therapy for at least three days. ° ° °Common Side Effects: ° °· Headaches- take your usual medications as directed by your physician.   ° °· Restlessness or inability to sleep- you may have trouble sleeping for the next few days.  Ask your referring physician if you need any medication for sleep if over the counter sleep medications do not help. ° °· Facial flushing or redness- this should subside within a few days. ° °· Increased pain- a temporary increase in pain a day or two following your procedure is not unusual.  Take your pain medication as prescribed by your referring physician.  You may use ice to the injection site as needed.  Please do not use heat for 24 hours. ° °· Leg cramps ° °Please contact our office at [phone] for the following symptoms: °· Fever greater than 100 degrees. °· Headaches unresolved with medication after 2-3 days. °· Increased swelling, pain, or redness at injection site. ° °Thank you for visiting our office. ° ° °You may resume Eliquis today. °

## 2019-05-27 ENCOUNTER — Encounter: Payer: Self-pay | Admitting: Orthopaedic Surgery

## 2019-05-27 ENCOUNTER — Ambulatory Visit (INDEPENDENT_AMBULATORY_CARE_PROVIDER_SITE_OTHER): Payer: Medicare Other | Admitting: Orthopaedic Surgery

## 2019-05-27 ENCOUNTER — Other Ambulatory Visit: Payer: Self-pay

## 2019-05-27 VITALS — BP 136/89 | HR 74 | Ht 65.0 in | Wt 195.2 lb

## 2019-05-27 DIAGNOSIS — Z7901 Long term (current) use of anticoagulants: Secondary | ICD-10-CM

## 2019-05-27 DIAGNOSIS — M545 Low back pain: Secondary | ICD-10-CM

## 2019-05-27 DIAGNOSIS — G8929 Other chronic pain: Secondary | ICD-10-CM | POA: Diagnosis not present

## 2019-05-27 NOTE — Progress Notes (Signed)
Patient [id number] Angela House, female DOB:September 25, 1935, 83 y.o. [name]  Chief Complaint  Patient presents with  . Back Pain    much  better since that shot    HPI  Angela House is a 83 y.o. female who has lower back pain.  She had an epidural and has done fairly well with it. She still has some pain but not as much as before.  She has no weakness. She is doing her exercises and taking her medicine.   Body mass index is 32.49 kg/m.  ROS  Review of Systems  HENT: Negative for congestion.   Respiratory: Negative for cough and shortness of breath.   Cardiovascular: Negative for chest pain and leg swelling.  Gastrointestinal: Positive for abdominal pain.  Endocrine: Positive for cold intolerance.  Musculoskeletal: Positive for arthralgias, back pain and gait problem.  Allergic/Immunologic: Positive for environmental allergies.  All other systems reviewed and are negative.   All other systems reviewed and are negative.  The following is a summary of the past history medically, past history surgically, known current medicines, social history and family history.  This information is gathered electronically by the computer from prior information and documentation.  I review this each visit and have found including this information at this point in the chart is beneficial and informative.    Past Medical History:  Diagnosis Date  . Arthritis   . Hypertension   . PE (pulmonary thromboembolism) ([place])    bilaterally, 03/2017  . Restless leg syndrome     Past Surgical History:  Procedure Laterality Date  . ABDOMINAL HYSTERECTOMY    . CHOLECYSTECTOMY    . EYE SURGERY    . KNEE SURGERY    . WRIST SURGERY      Family History  Problem Relation Age of Onset  . Arthritis Mother   . Hypertension Mother   . Hypertension Sister   . Pneumonia Brother   . Hypertension Sister   . Kidney disease Sister   . Colon cancer Neg Hx     Social History Social History   Tobacco Use  .  Smoking status: Never Smoker  . Smokeless tobacco: Never Used  Substance Use Topics  . Alcohol use: No    Alcohol/week: 0.0 standard drinks  . Drug use: No    Allergies  Allergen Reactions  . Ciprofloxacin Hives  . Meclizine Other (See Comments)    dizziness    Current Outpatient Medications  Medication Sig Dispense Refill  . acetaminophen (TYLENOL) 325 MG tablet Take 325 mg by mouth every 6 (six) hours as needed.    Marland Kitchen apixaban (ELIQUIS) 2.5 MG TABS tablet Take 2.5 mg by mouth 2 (two) times daily.    . ASPERCREME W/LIDOCAINE EX Apply 1 application topically daily as needed (FOR HIP/KNEE PAIN).     Marland Kitchen calcium carbonate (OS-CAL - DOSED IN MG OF ELEMENTAL CALCIUM) 1250 (500 Ca) MG tablet Take 2 tablets by mouth 2 (two) times daily.     . Carboxymethylcellulose Sodium (THERATEARS) 0.25 % SOLN Apply 1 drop to eye as needed.     . cetirizine (ZYRTEC) 10 MG tablet Take 10 mg by mouth daily as needed.     . clobetasol (TEMOVATE) 0.05 % external solution Apply 1 application topically as needed. Applied to scalp    . clobetasol cream (TEMOVATE) AB-123456789 % Apply 1 application topically daily as needed (FOR RASH).     Marland Kitchen clotrimazole-betamethasone (LOTRISONE) cream as needed.     . cycloSPORINE (RESTASIS) 0.05 %  ophthalmic emulsion Place 1 drop into both eyes 2 (two) times daily.    . diazepam (VALIUM) 2 MG tablet Take 2 mg by mouth as needed for anxiety.     . famotidine (PEPCID) 20 MG tablet Take 1 tablet (20 mg total) by mouth 2 (two) times daily. 60 tablet 5  . fluticasone (FLONASE) 50 MCG/ACT nasal spray Place 2 sprays into the nose as needed for allergies or rhinitis.     . furosemide (LASIX) 20 MG tablet Take by mouth as needed.     . gabapentin (NEURONTIN) 100 MG capsule Take 100 mg by mouth 2 (two) times daily.     . Multiple Vitamin (MULTIVITAMIN) capsule Take 1 capsule by mouth every morning.     Angela House Glycol-Propyl Glycol (SYSTANE) 0.4-0.3 % SOLN Apply 1 drop to eye 4 (four) times  daily.    . Potassium 99 MG TABS Take 1 tablet by mouth every other day.     . VENTOLIN HFA 108 (90 Base) MCG/ACT inhaler Inhale 1-2 puffs into the lungs every 6 (six) hours as needed.      No current facility-administered medications for this visit.      Physical Exam  Blood pressure 136/89, pulse 74, height 5\' 5"  (1.651 m), weight 195 lb 4 oz (88.6 kg).  Constitutional: overall normal hygiene, normal nutrition, well developed, normal grooming, normal body habitus. Assistive device:none  Musculoskeletal: gait and station Limp none, muscle tone and strength are normal, no tremors or atrophy is present.  .  Neurological: coordination overall normal.  Deep tendon reflex/nerve stretch intact.  Sensation normal.  Cranial nerves II-XII intact.   Skin:   Normal overall no scars, lesions, ulcers or rashes. No psoriasis.  Psychiatric: Alert and oriented x 3.  Recent memory intact, remote memory unclear.  Normal mood and affect. Well groomed.  Good eye contact.  Cardiovascular: overall no swelling, no varicosities, no edema bilaterally, normal temperatures of the legs and arms, no clubbing, cyanosis and good capillary refill.  Spine/Pelvis examination:  Inspection:  Overall, sacoiliac joint benign and hips nontender; without crepitus or defects.   Thoracic spine inspection: Alignment normal without kyphosis present   Lumbar spine inspection:  Alignment  with normal lumbar lordosis, without scoliosis apparent.   Thoracic spine palpation:  without tenderness of spinal processes   Lumbar spine palpation: without tenderness of lumbar area; without tightness of lumbar muscles    Range of Motion:   Lumbar flexion, forward flexion is normal without pain or tenderness    Lumbar extension is full without pain or tenderness   Left lateral bend is normal without pain or tenderness   Right lateral bend is normal without pain or tenderness   Straight leg raising is normal  Strength & tone:  normal   Stability overall normal stability  Lymphatic: palpation is normal.  All other systems reviewed and are negative   The patient has been educated about the nature of the problem(s) and counseled on treatment options.  The patient appeared to understand what I have discussed and is in agreement with it.  Encounter Diagnoses  Name Primary?  . Chronic midline low back pain without sciatica Yes  . Adequate anticoagulation on anticoagulant therapy     PLAN Call if any problems.  Precautions discussed.  Continue current medications.   Return to clinic 2 months   I have reviewed the [place] web site prior to prescribing narcotic medicine for this patient.

## 2019-06-03 DIAGNOSIS — I2699 Other pulmonary embolism without acute cor pulmonale: Secondary | ICD-10-CM | POA: Diagnosis not present

## 2019-06-03 DIAGNOSIS — Z7901 Long term (current) use of anticoagulants: Secondary | ICD-10-CM | POA: Diagnosis not present

## 2019-06-10 DIAGNOSIS — I2699 Other pulmonary embolism without acute cor pulmonale: Secondary | ICD-10-CM | POA: Diagnosis not present

## 2019-06-30 ENCOUNTER — Other Ambulatory Visit: Payer: Self-pay

## 2019-06-30 ENCOUNTER — Encounter: Payer: Self-pay | Admitting: Orthopaedic Surgery

## 2019-06-30 ENCOUNTER — Ambulatory Visit (INDEPENDENT_AMBULATORY_CARE_PROVIDER_SITE_OTHER): Payer: Medicare Other | Admitting: Orthopaedic Surgery

## 2019-06-30 VITALS — BP 131/77 | HR 71 | Ht 65.0 in | Wt 191.0 lb

## 2019-06-30 DIAGNOSIS — Z7901 Long term (current) use of anticoagulants: Secondary | ICD-10-CM | POA: Diagnosis not present

## 2019-06-30 DIAGNOSIS — G2581 Restless legs syndrome: Secondary | ICD-10-CM | POA: Diagnosis not present

## 2019-06-30 DIAGNOSIS — M79671 Pain in right foot: Secondary | ICD-10-CM | POA: Diagnosis not present

## 2019-06-30 DIAGNOSIS — G8929 Other chronic pain: Secondary | ICD-10-CM

## 2019-06-30 DIAGNOSIS — M545 Low back pain, unspecified: Secondary | ICD-10-CM

## 2019-06-30 NOTE — Progress Notes (Signed)
[name], female DOB:Sep 03, 1935, 83 y.o. [name]  Chief Complaint  Patient presents with  . Leg Pain    bilateral legs painful at night     HPI  Angela House is a 83 y.o. female who has pain in both feet more at night.  She has more pain then. During the day she is doing OK.  I have explained peripheral neuropathy. Dr. Brigitte House is giving her Neurontin and she is limited to 200 mgm a day.  More causes GI problems.  He may want to change to Lyrica.  She will contact him.  She has no trauma.   Body mass index is 31.78 kg/m.  ROS  Review of Systems  HENT: Negative for congestion.   Respiratory: Negative for cough and shortness of breath.   Cardiovascular: Negative for chest pain and leg swelling.  Gastrointestinal: Positive for abdominal pain.  Endocrine: Positive for cold intolerance.  Musculoskeletal: Positive for arthralgias, back pain and gait problem.  Allergic/Immunologic: Positive for environmental allergies.  All other systems reviewed and are negative.   All other systems reviewed and are negative.  The following is a summary of the past history medically, past history surgically, known current medicines, social history and family history.  This information is gathered electronically by the computer from prior information and documentation.  I review this each visit and have found including this information at this point in the chart is beneficial and informative.    Past Medical History:  Diagnosis Date  . Arthritis   . Hypertension   . PE (pulmonary thromboembolism) ([place])    bilaterally, 03/2017  . Restless leg syndrome     Past Surgical History:  Procedure Laterality Date  . ABDOMINAL HYSTERECTOMY    . CHOLECYSTECTOMY    . EYE SURGERY    . KNEE SURGERY    . WRIST SURGERY      Family History  Problem Relation Age of Onset  . Arthritis Mother   . Hypertension Mother   . Hypertension Sister   . Pneumonia Brother   . Hypertension Sister    . Kidney disease Sister   . Colon cancer Neg Hx     Social History Social History   Tobacco Use  . Smoking status: Never Smoker  . Smokeless tobacco: Never Used  Substance Use Topics  . Alcohol use: No    Alcohol/week: 0.0 standard drinks  . Drug use: No    Allergies  Allergen Reactions  . Ciprofloxacin Hives  . Meclizine Other (See Comments)    dizziness    Current Outpatient Medications  Medication Sig Dispense Refill  . acetaminophen (TYLENOL) 325 MG tablet Take 325 mg by mouth every 6 (six) hours as needed.    Marland Kitchen apixaban (ELIQUIS) 2.5 MG TABS tablet Take 2.5 mg by mouth 2 (two) times daily.    . ASPERCREME W/LIDOCAINE EX Apply 1 application topically daily as needed (FOR HIP/KNEE PAIN).     Marland Kitchen calcium carbonate (OS-CAL - DOSED IN MG OF ELEMENTAL CALCIUM) 1250 (500 Ca) MG tablet Take 2 tablets by mouth 2 (two) times daily.     . Carboxymethylcellulose Sodium (THERATEARS) 0.25 % SOLN Apply 1 drop to eye as needed.     . cetirizine (ZYRTEC) 10 MG tablet Take 10 mg by mouth daily as needed.     . clobetasol (TEMOVATE) 0.05 % external solution Apply 1 application topically as needed. Applied to scalp    . clobetasol cream (TEMOVATE) AB-123456789 % Apply 1 application  topically daily as needed (FOR RASH).     Marland Kitchen clotrimazole-betamethasone (LOTRISONE) cream as needed.     . cycloSPORINE (RESTASIS) 0.05 % ophthalmic emulsion Place 1 drop into both eyes 2 (two) times daily.    . diazepam (VALIUM) 2 MG tablet Take 2 mg by mouth as needed for anxiety.     . famotidine (PEPCID) 20 MG tablet Take 1 tablet (20 mg total) by mouth 2 (two) times daily. 60 tablet 5  . fluticasone (FLONASE) 50 MCG/ACT nasal spray Place 2 sprays into the nose as needed for allergies or rhinitis.     . furosemide (LASIX) 20 MG tablet Take by mouth as needed.     . gabapentin (NEURONTIN) 100 MG capsule Take 100 mg by mouth 2 (two) times daily.     . Multiple Vitamin (MULTIVITAMIN) capsule Take 1 capsule by mouth every  morning.     [name] (SYSTANE) 0.4-0.3 % SOLN Apply 1 drop to eye 4 (four) times daily.    . Potassium 99 MG TABS Take 1 tablet by mouth every other day.     . VENTOLIN HFA 108 (90 Base) MCG/ACT inhaler Inhale 1-2 puffs into the lungs every 6 (six) hours as needed.      No current facility-administered medications for this visit.     Physical Exam  Blood pressure 131/77, House 71, height 5\' 5"  (1.651 m), weight 191 lb (86.6 kg).  Constitutional: overall normal hygiene, normal nutrition, well developed, normal grooming, normal body habitus. Assistive device:none  Musculoskeletal: gait and station Limp none, muscle tone and strength are normal, no tremors or atrophy is present.  .  Neurological: coordination overall normal.  Deep tendon reflex/nerve stretch intact.  Sensation normal.  Cranial nerves II-XII intact.   Skin:   Normal overall no scars, lesions, ulcers or rashes. No psoriasis.  Psychiatric: Alert and oriented x 3.  Recent memory intact, remote memory unclear.  Normal mood and affect. Well groomed.  Good eye contact.  Cardiovascular: overall no swelling, no varicosities, no edema bilaterally, normal temperatures of the legs and arms, no clubbing, cyanosis and good capillary refill. Pulses are present but she has some decreased sensation in both feet.  Lymphatic: palpation is normal.  All other systems reviewed and are negative   The patient has been educated about the nature of the problem(s) and counseled on treatment options.  The patient appeared to understand what I have discussed and is in agreement with it.  Encounter Diagnoses  Name Primary?  . Pain in right foot Yes  . Restless leg syndrome   . Chronic midline low back pain without sciatica   . Adequate anticoagulation on anticoagulant therapy     PLAN Call if any problems.  Precautions discussed.  Continue current medications.   Return to clinic 2 months   Electronically  Signed Sanjuana Kava, MD 12/29/20202:21 PM

## 2019-06-30 NOTE — Patient Instructions (Addendum)
Ask Dr. Manuella Ghazi about trying Lyrica since you could not tolerate higher dose of Gabapentin. This may help your leg pain. Continue the Aspercreme   Peripheral Neuropathy Peripheral neuropathy is a type of nerve damage. It affects nerves that carry signals between the spinal cord and the arms, legs, and the rest of the body (peripheral nerves). It does not affect nerves in the spinal cord or brain. In peripheral neuropathy, one nerve or a group of nerves may be damaged. Peripheral neuropathy is a broad category that includes many specific nerve disorders, like diabetic neuropathy, hereditary neuropathy, and carpal tunnel syndrome. What are the causes? This condition may be caused by:  Diabetes. This is the most common cause of peripheral neuropathy.  Nerve injury.  Pressure or stress on a nerve that lasts a long time.  Lack (deficiency) of B vitamins. This can result from alcoholism, poor diet, or a restricted diet.  Infections.  Autoimmune diseases, such as rheumatoid arthritis and systemic lupus erythematosus.  Nerve diseases that are passed from parent to child (inherited).  Some medicines, such as cancer medicines (chemotherapy).  Poisonous (toxic) substances, such as lead and mercury.  Too little blood flowing to the legs.  Kidney disease.  Thyroid disease. In some cases, the cause of this condition is not known. What are the signs or symptoms? Symptoms of this condition depend on which of your nerves is damaged. Common symptoms include:  Loss of feeling (numbness) in the feet, hands, or both.  Tingling in the feet, hands, or both.  Burning pain.  Very sensitive skin.  Weakness.  Not being able to move a part of the body (paralysis).  Muscle twitching.  Clumsiness or poor coordination.  Loss of balance.  Not being able to control your bladder.  Feeling dizzy.  Sexual problems. How is this diagnosed? Diagnosing and finding the cause of peripheral neuropathy  can be difficult. Your health care provider will take your medical history and do a physical exam. A neurological exam will also be done. This involves checking things that are affected by your brain, spinal cord, and nerves (nervous system). For example, your health care provider will check your reflexes, how you move, and what you can feel. You may have other tests, such as:  Blood tests.  Electromyogram (EMG) and nerve conduction tests. These tests check nerve function and how well the nerves are controlling the muscles.  Imaging tests, such as CT scans or MRI to rule out other causes of your symptoms.  Removing a small piece of nerve to be examined in a lab (nerve biopsy). This is rare.  Removing and examining a small amount of the fluid that surrounds the brain and spinal cord (lumbar puncture). This is rare. How is this treated? Treatment for this condition may involve:  Treating the underlying cause of the neuropathy, such as diabetes, kidney disease, or vitamin deficiencies.  Stopping medicines that can cause neuropathy, such as chemotherapy.  Medicine to relieve pain. Medicines may include: ? Prescription or over-the-counter pain medicine. ? Antiseizure medicine. ? Antidepressants. ? Pain-relieving patches that are applied to painful areas of skin.  Surgery to relieve pressure on a nerve or to destroy a nerve that is causing pain.  Physical therapy to help improve movement and balance.  Devices to help you move around (assistive devices). Follow these instructions at home: Medicines  Take over-the-counter and prescription medicines only as told by your health care provider. Do not take any other medicines without first asking your health  care provider.  Do not drive or use heavy machinery while taking prescription pain medicine. Lifestyle   Do not use any products that contain nicotine or tobacco, such as cigarettes and e-cigarettes. Smoking keeps blood from reaching  damaged nerves. If you need help quitting, ask your health care provider.  Avoid or limit alcohol. Too much alcohol can cause a vitamin B deficiency, and vitamin B is needed for healthy nerves.  Eat a healthy diet. This includes: ? Eating foods that are high in fiber, such as fresh fruits and vegetables, whole grains, and beans. ? Limiting foods that are high in fat and processed sugars, such as fried or sweet foods. General instructions   If you have diabetes, work closely with your health care provider to keep your blood sugar under control.  If you have numbness in your feet: ? Check every day for signs of injury or infection. Watch for redness, warmth, and swelling. ? Wear padded socks and comfortable shoes. These help protect your feet.  Develop a good support system. Living with peripheral neuropathy can be stressful. Consider talking with a mental health specialist or joining a support group.  Use assistive devices and attend physical therapy as told by your health care provider. This may include using a walker or a cane.  Keep all follow-up visits as told by your health care provider. This is important. Contact a health care provider if:  You have new signs or symptoms of peripheral neuropathy.  You are struggling emotionally from dealing with peripheral neuropathy.  Your pain is not well-controlled. Get help right away if:  You have an injury or infection that is not healing normally.  You develop new weakness in an arm or leg.  You fall frequently. Summary  Peripheral neuropathy is when the nerves in the arms, or legs are damaged, resulting in numbness, weakness, or pain.  There are many causes of peripheral neuropathy, including diabetes, pinched nerves, vitamin deficiencies, autoimmune disease, and hereditary conditions.  Diagnosing and finding the cause of peripheral neuropathy can be difficult. Your health care provider will take your medical history, do a  physical exam, and do tests, including blood tests and nerve function tests.  Treatment involves treating the underlying cause of the neuropathy and taking medicines to help control pain. Physical therapy and assistive devices may also help. This information is not intended to replace advice given to you by your health care provider. Make sure you discuss any questions you have with your health care provider. Document Released: 06/08/2002 Document Revised: 05/31/2017 Document Reviewed: 08/27/2016 Elsevier Patient Education  2020 [employer].

## 2019-07-14 ENCOUNTER — Encounter: Payer: Self-pay | Admitting: Internal Medicine

## 2019-07-14 ENCOUNTER — Ambulatory Visit (INDEPENDENT_AMBULATORY_CARE_PROVIDER_SITE_OTHER): Payer: Medicare Other | Admitting: Internal Medicine

## 2019-07-14 ENCOUNTER — Other Ambulatory Visit: Payer: Self-pay

## 2019-07-14 VITALS — BP 156/89 | HR 83 | Temp 96.8°F | Ht 65.0 in | Wt 198.2 lb

## 2019-07-14 DIAGNOSIS — K219 Gastro-esophageal reflux disease without esophagitis: Secondary | ICD-10-CM

## 2019-07-14 NOTE — Progress Notes (Signed)
Primary Care Physician:  Monico Blitz, MD Primary Gastroenterologist:  Dr. Gala Romney  Pre-Procedure History & Physical: HPI:  Angela House is a 84 y.o. female here for follow-up of GERD.  Now on Pepcid 20 mg twice daily with excellent control of reflux symptoms no dysphagia.  No abdominal pain early satiety nausea or vomiting.  No bowel complaints.  Has multiple non-GI complaints including flare neuropathy symptoms.  Patient concerned about blood pressure running a little higher today than it usually does.   Past Medical History:  Diagnosis Date  . Arthritis   . Hypertension   . PE (pulmonary thromboembolism) ([place])    bilaterally, 03/2017  . Restless leg syndrome     Past Surgical History:  Procedure Laterality Date  . ABDOMINAL HYSTERECTOMY    . CHOLECYSTECTOMY    . EYE SURGERY    . KNEE SURGERY    . WRIST SURGERY      Prior to Admission medications   Medication Sig Start Date End Date Taking? Authorizing Provider  acetaminophen (TYLENOL) 325 MG tablet Take 325 mg by mouth every 6 (six) hours as needed.   Yes [provider]  apixaban (ELIQUIS) 2.5 MG TABS tablet Take 2.5 mg by mouth 2 (two) times daily.   Yes [provider]  ASPERCREME W/LIDOCAINE EX Apply 1 application topically daily as needed (FOR HIP/KNEE PAIN).    Yes [provider]  calcium carbonate (OS-CAL - DOSED IN MG OF ELEMENTAL CALCIUM) 1250 (500 Ca) MG tablet Take 2 tablets by mouth 2 (two) times daily.    Yes [provider]  Carboxymethylcellulose Sodium (THERATEARS) 0.25 % SOLN Apply 1 drop to eye as needed.    Yes [provider]  cetirizine (ZYRTEC) 10 MG tablet Take 10 mg by mouth daily as needed.    Yes [provider]  clobetasol (TEMOVATE) 0.05 % external solution Apply 1 application topically as needed. Applied to scalp 06/11/17  Yes [provider]  clobetasol cream (TEMOVATE) AB-123456789 % Apply 1 application topically daily as needed (FOR  RASH).  12/13/15  Yes [provider]  clotrimazole-betamethasone (LOTRISONE) cream as needed.  02/28/19  Yes [provider]  cycloSPORINE (RESTASIS) 0.05 % ophthalmic emulsion Place 1 drop into both eyes 2 (two) times daily.   Yes [provider]  diazepam (VALIUM) 2 MG tablet Take 2 mg by mouth as needed for anxiety.    Yes [provider]  famotidine (PEPCID) 20 MG tablet Take 1 tablet (20 mg total) by mouth 2 (two) times daily. 04/22/19  Yes Annitta Needs, NP  fluticasone (FLONASE) 50 MCG/ACT nasal spray Place 2 sprays into the nose as needed for allergies or rhinitis.    Yes [provider]  furosemide (LASIX) 20 MG tablet Take by mouth as needed.    Yes [provider]  gabapentin (NEURONTIN) 100 MG capsule Take 100 mg by mouth 2 (two) times daily.    Yes [provider]  Multiple Vitamin (MULTIVITAMIN) capsule Take 1 capsule by mouth every morning.    Yes [provider]  Polyethyl Glycol-Propyl Glycol (SYSTANE) 0.4-0.3 % SOLN Apply 1 drop to eye 4 (four) times daily.   Yes [provider]  Potassium 99 MG TABS Take 1 tablet by mouth every other day.    Yes [provider]  VENTOLIN HFA 108 (90 Base) MCG/ACT inhaler Inhale 1-2 puffs into the lungs every 6 (six) hours as needed.  03/24/17  Yes [provider]  Allergies as of 07/14/2019 - Review Complete 07/14/2019  Allergen Reaction Noted  . Ciprofloxacin Hives 09/21/2015  . Meclizine Other (See Comments) 09/21/2015    Family History  Problem Relation Age of Onset  . Arthritis Mother   . Hypertension Mother   . Hypertension Sister   . Pneumonia Brother   . Hypertension Sister   . Kidney disease Sister   . Colon cancer Neg Hx     Social History   Socioeconomic History  . Marital status: Widowed    Spouse name: Not on file  . Number of children: Not on file  . Years of education: Not on file  . Highest education level: Not on  file  Occupational History  . Not on file  Tobacco Use  . Smoking status: Never Smoker  . Smokeless tobacco: Never Used  Substance and Sexual Activity  . Alcohol use: No    Alcohol/week: 0.0 standard drinks  . Drug use: No  . Sexual activity: Not on file  Other Topics Concern  . Not on file  Social History Narrative  . Not on file   Social Determinants of Health   Financial Resource Strain:   . Difficulty of Paying Living Expenses: Not on file  Food Insecurity:   . Worried About Charity fundraiser in the Last Year: Not on file  . Ran Out of Food in the Last Year: Not on file  Transportation Needs:   . Lack of Transportation (Medical): Not on file  . Lack of Transportation (Non-Medical): Not on file  Physical Activity:   . Days of Exercise per Week: Not on file  . Minutes of Exercise per Session: Not on file  Stress:   . Feeling of Stress : Not on file  Social Connections:   . Frequency of Communication with Friends and Family: Not on file  . Frequency of Social Gatherings with Friends and Family: Not on file  . Attends Religious Services: Not on file  . Active Member of Clubs or Organizations: Not on file  . Attends Archivist Meetings: Not on file  . Marital Status: Not on file  Intimate Partner Violence:   . Fear of Current or Ex-Partner: Not on file  . Emotionally Abused: Not on file  . Physically Abused: Not on file  . Sexually Abused: Not on file    Review of Systems: See HPI, otherwise negative ROS  Physical Exam: BP (!) 156/89   Pulse 83   Temp (!) 96.8 F (36 C) (Temporal)   Ht 5\' 5"  (1.651 m)   Wt 198 lb 3.2 oz (89.9 kg)   BMI 32.98 kg/m  General:   Alert,  Well-developed, well-nourished, pleasant and cooperative in NAD  Impression/Plan: 84 year old lady with uncomplicated GERD well-controlled on twice daily standard dose H2 blocker therapy.  No alarm symptoms.  Patient has multiple non-GI complaints as outlined above.  Blood pressure  up today single reading.  May be whitecoat hypertension.  But deserves follow-up.   Recommendations:  Continue Pepsid 20 mg twice daily  GERD information provided  Recheck blood pressure at home.  If it continues to run higher than baseline.  Follow-up with Dr. Manuella Ghazi  OV her in 1 year     Notice: This dictation was prepared with Dragon dictation along with smaller phrase technology. Any transcriptional errors that result from this process are unintentional and may not be corrected upon review.

## 2019-07-14 NOTE — Patient Instructions (Addendum)
Continue Pepsid 20 mg twice daily  GERD information provided  OV her in 1 year

## 2019-07-15 DIAGNOSIS — M25561 Pain in right knee: Secondary | ICD-10-CM | POA: Diagnosis not present

## 2019-07-15 DIAGNOSIS — Z299 Encounter for prophylactic measures, unspecified: Secondary | ICD-10-CM | POA: Diagnosis not present

## 2019-07-15 DIAGNOSIS — I10 Essential (primary) hypertension: Secondary | ICD-10-CM | POA: Diagnosis not present

## 2019-07-15 DIAGNOSIS — M25562 Pain in left knee: Secondary | ICD-10-CM | POA: Diagnosis not present

## 2019-07-15 DIAGNOSIS — I739 Peripheral vascular disease, unspecified: Secondary | ICD-10-CM | POA: Diagnosis not present

## 2019-07-22 ENCOUNTER — Ambulatory Visit: Payer: Medicare Other | Admitting: Orthopaedic Surgery

## 2019-07-27 DIAGNOSIS — Z299 Encounter for prophylactic measures, unspecified: Secondary | ICD-10-CM | POA: Diagnosis not present

## 2019-07-27 DIAGNOSIS — Z713 Dietary counseling and surveillance: Secondary | ICD-10-CM | POA: Diagnosis not present

## 2019-07-27 DIAGNOSIS — G629 Polyneuropathy, unspecified: Secondary | ICD-10-CM | POA: Diagnosis not present

## 2019-07-27 DIAGNOSIS — I10 Essential (primary) hypertension: Secondary | ICD-10-CM | POA: Diagnosis not present

## 2019-07-28 DIAGNOSIS — Z1231 Encounter for screening mammogram for malignant neoplasm of breast: Secondary | ICD-10-CM | POA: Diagnosis not present

## 2019-08-04 DIAGNOSIS — G629 Polyneuropathy, unspecified: Secondary | ICD-10-CM | POA: Diagnosis not present

## 2019-08-04 DIAGNOSIS — I2699 Other pulmonary embolism without acute cor pulmonale: Secondary | ICD-10-CM | POA: Diagnosis not present

## 2019-08-04 DIAGNOSIS — Z789 Other specified health status: Secondary | ICD-10-CM | POA: Diagnosis not present

## 2019-08-04 DIAGNOSIS — N183 Chronic kidney disease, stage 3 unspecified: Secondary | ICD-10-CM | POA: Diagnosis not present

## 2019-08-04 DIAGNOSIS — I10 Essential (primary) hypertension: Secondary | ICD-10-CM | POA: Diagnosis not present

## 2019-08-04 DIAGNOSIS — Z299 Encounter for prophylactic measures, unspecified: Secondary | ICD-10-CM | POA: Diagnosis not present

## 2019-09-02 ENCOUNTER — Ambulatory Visit (INDEPENDENT_AMBULATORY_CARE_PROVIDER_SITE_OTHER): Payer: Medicare Other | Admitting: Orthopaedic Surgery

## 2019-09-02 ENCOUNTER — Other Ambulatory Visit: Payer: Self-pay

## 2019-09-02 ENCOUNTER — Encounter: Payer: Self-pay | Admitting: Orthopaedic Surgery

## 2019-09-02 VITALS — BP 167/101 | HR 76 | Ht 65.0 in | Wt 197.2 lb

## 2019-09-02 DIAGNOSIS — Z7901 Long term (current) use of anticoagulants: Secondary | ICD-10-CM | POA: Diagnosis not present

## 2019-09-02 DIAGNOSIS — G2581 Restless legs syndrome: Secondary | ICD-10-CM

## 2019-09-02 DIAGNOSIS — M7062 Trochanteric bursitis, left hip: Secondary | ICD-10-CM | POA: Diagnosis not present

## 2019-09-02 DIAGNOSIS — M545 Low back pain: Secondary | ICD-10-CM

## 2019-09-02 DIAGNOSIS — I2699 Other pulmonary embolism without acute cor pulmonale: Secondary | ICD-10-CM | POA: Diagnosis not present

## 2019-09-02 DIAGNOSIS — G8929 Other chronic pain: Secondary | ICD-10-CM

## 2019-09-02 DIAGNOSIS — M25572 Pain in left ankle and joints of left foot: Secondary | ICD-10-CM | POA: Diagnosis not present

## 2019-09-02 NOTE — Progress Notes (Signed)
Patient IS:[id number] Angela House, female DOB:[birth year], 84 y.o. [id number]  Chief Complaint  Patient presents with  . Back Pain    Doing ok/just having some pain on left side all the way down to ankle    HPI  Angela House is a 84 y.o. female who has chronic lower back pain.  She has developed pain over the lateral hip on the left and left ankle tenderness.  She says her back pain is better after the epidural but her ankle pain is worse.  She has no swelling of the ankle but it seems to not be as strong as she would like.  She has an ankle brace she is not wearing.  Her hip hurts more at night when she lays on it.  She has no trauma.   Body mass index is 32.82 kg/m.  ROS  Review of Systems  HENT: Negative for congestion.   Respiratory: Negative for cough and shortness of breath.   Cardiovascular: Negative for chest pain and leg swelling.  Gastrointestinal: Positive for abdominal pain.  Endocrine: Positive for cold intolerance.  Musculoskeletal: Positive for arthralgias, back pain and gait problem.  Allergic/Immunologic: Positive for environmental allergies.  All other systems reviewed and are negative.    All other systems reviewed and are negative.  The following is a summary of the past history medically, past history surgically, known current medicines, social history and family history.  This information is gathered electronically by the computer from prior information and documentation.  I review this each visit and have found including this information at this point in the chart is beneficial and informative.    Past Medical History:  Diagnosis Date  . Arthritis   . Hypertension   . PE (pulmonary thromboembolism) ([place])    bilaterally, 03/2017  . Restless leg syndrome     Past Surgical History:  Procedure Laterality Date  . ABDOMINAL HYSTERECTOMY    . CHOLECYSTECTOMY    . EYE SURGERY    . KNEE SURGERY    . WRIST SURGERY      Family History  Problem Relation Age of  Onset  . Arthritis Mother   . Hypertension Mother   . Hypertension Sister   . Pneumonia Brother   . Hypertension Sister   . Kidney disease Sister   . Colon cancer Neg Hx     Social History Social History   Tobacco Use  . Smoking status: Never Smoker  . Smokeless tobacco: Never Used  Substance Use Topics  . Alcohol use: No    Alcohol/week: 0.0 standard drinks  . Drug use: No    Allergies  Allergen Reactions  . Ciprofloxacin Hives  . Meclizine Other (See Comments)    dizziness    Current Outpatient Medications  Medication Sig Dispense Refill  . acetaminophen (TYLENOL) 325 MG tablet Take 325 mg by mouth every 6 (six) hours as needed.    Marland Kitchen apixaban (ELIQUIS) 2.5 MG TABS tablet Take 2.5 mg by mouth 2 (two) times daily.    . ASPERCREME W/LIDOCAINE EX Apply 1 application topically daily as needed (FOR HIP/KNEE PAIN).     Marland Kitchen calcium carbonate (OS-CAL - DOSED IN MG OF ELEMENTAL CALCIUM) 1250 (500 Ca) MG tablet Take 2 tablets by mouth 2 (two) times daily.     . Carboxymethylcellulose Sodium (THERATEARS) 0.25 % SOLN Apply 1 drop to eye as needed.     . cetirizine (ZYRTEC) 10 MG tablet Take 10 mg by mouth daily as needed.     Marland Kitchen  clobetasol (TEMOVATE) 0.05 % external solution Apply 1 application topically as needed. Applied to scalp    . clobetasol cream (TEMOVATE) AB-123456789 % Apply 1 application topically daily as needed (FOR RASH).     Marland Kitchen clotrimazole-betamethasone (LOTRISONE) cream as needed.     . cycloSPORINE (RESTASIS) 0.05 % ophthalmic emulsion Place 1 drop into both eyes 2 (two) times daily.    . diazepam (VALIUM) 2 MG tablet Take 2 mg by mouth as needed for anxiety.     . famotidine (PEPCID) 20 MG tablet Take 1 tablet (20 mg total) by mouth 2 (two) times daily. 60 tablet 5  . fluticasone (FLONASE) 50 MCG/ACT nasal spray Place 2 sprays into the nose as needed for allergies or rhinitis.     . furosemide (LASIX) 20 MG tablet Take by mouth as needed.     . gabapentin (NEURONTIN) 100 MG  capsule Take 100 mg by mouth 2 (two) times daily.     . Multiple Vitamin (MULTIVITAMIN) capsule Take 1 capsule by mouth every morning.     [name] (SYSTANE) 0.4-0.3 % SOLN Apply 1 drop to eye 4 (four) times daily.    . Potassium 99 MG TABS Take 1 tablet by mouth every other day.     . VENTOLIN HFA 108 (90 Base) MCG/ACT inhaler Inhale 1-2 puffs into the lungs every 6 (six) hours as needed.      No current facility-administered medications for this visit.     Physical Exam  Blood pressure (!) 167/101, pulse 76, height 5\' 5"  (1.651 m), weight 197 lb 4 oz (89.5 kg).  Constitutional: overall normal hygiene, normal nutrition, well developed, normal grooming, normal body habitus. Assistive device:none  Musculoskeletal: gait and station Limp left, muscle tone and strength are normal, no tremors or atrophy is present.  .  Neurological: coordination overall normal.  Deep tendon reflex/nerve stretch intact.  Sensation normal.  Cranial nerves II-XII intact.   Skin:   Normal overall no scars, lesions, ulcers or rashes. No psoriasis.  Psychiatric: Alert and oriented x 3.  Recent memory intact, remote memory unclear.  Normal mood and affect. Well groomed.  Good eye contact.  Cardiovascular: overall no swelling, no varicosities, no edema bilaterally, normal temperatures of the legs and arms, no clubbing, cyanosis and good capillary refill.  Lymphatic: palpation is normal.  Spine/Pelvis examination:  Inspection:  Overall, sacoiliac joint benign and hips nontender; without crepitus or defects.   Thoracic spine inspection: Alignment normal without kyphosis present   Lumbar spine inspection:  Alignment  with normal lumbar lordosis, without scoliosis apparent.   Thoracic spine palpation:  without tenderness of spinal processes   Lumbar spine palpation: without tenderness of lumbar area; without tightness of lumbar muscles    Range of Motion:   Lumbar flexion, forward  flexion is normal without pain or tenderness    Lumbar extension is full without pain or tenderness   Left lateral bend is normal without pain or tenderness   Right lateral bend is normal without pain or tenderness   Straight leg raising is normal  Strength & tone: normal   Stability overall normal stability  Left hip has some tenderness laterally but full ROM.  NV intact.  The left ankle has no swelling but generalized tenderness. ROM is full. All other systems reviewed and are negative   The patient has been educated about the nature of the problem(s) and counseled on treatment options.  The patient appeared to understand what I have discussed  and is in agreement with it.  Encounter Diagnoses  Name Primary?  . Trochanteric bursitis of left hip Yes  . Pain in left ankle and joints of left foot   . Chronic midline low back pain without sciatica   . Restless leg syndrome    I will begin PT for her.  I have told her to continue present medicine.  Return in one month.  Call if any problem.  Precautions discussed.   Electronically Signed Sanjuana Kava, MD 3/3/20219:55 AM

## 2019-09-07 DIAGNOSIS — M545 Low back pain: Secondary | ICD-10-CM | POA: Diagnosis not present

## 2019-09-07 DIAGNOSIS — M7072 Other bursitis of hip, left hip: Secondary | ICD-10-CM | POA: Diagnosis not present

## 2019-09-07 DIAGNOSIS — M5416 Radiculopathy, lumbar region: Secondary | ICD-10-CM | POA: Diagnosis not present

## 2019-09-09 DIAGNOSIS — K029 Dental caries, unspecified: Secondary | ICD-10-CM | POA: Diagnosis not present

## 2019-09-09 DIAGNOSIS — I2699 Other pulmonary embolism without acute cor pulmonale: Secondary | ICD-10-CM | POA: Diagnosis not present

## 2019-09-09 DIAGNOSIS — Z7901 Long term (current) use of anticoagulants: Secondary | ICD-10-CM | POA: Diagnosis not present

## 2019-09-11 DIAGNOSIS — M7072 Other bursitis of hip, left hip: Secondary | ICD-10-CM | POA: Diagnosis not present

## 2019-09-11 DIAGNOSIS — M5416 Radiculopathy, lumbar region: Secondary | ICD-10-CM | POA: Diagnosis not present

## 2019-09-11 DIAGNOSIS — M545 Low back pain: Secondary | ICD-10-CM | POA: Diagnosis not present

## 2019-09-14 DIAGNOSIS — M7072 Other bursitis of hip, left hip: Secondary | ICD-10-CM | POA: Diagnosis not present

## 2019-09-14 DIAGNOSIS — M545 Low back pain: Secondary | ICD-10-CM | POA: Diagnosis not present

## 2019-09-14 DIAGNOSIS — M5416 Radiculopathy, lumbar region: Secondary | ICD-10-CM | POA: Diagnosis not present

## 2019-09-17 DIAGNOSIS — M545 Low back pain: Secondary | ICD-10-CM | POA: Diagnosis not present

## 2019-09-17 DIAGNOSIS — M5416 Radiculopathy, lumbar region: Secondary | ICD-10-CM | POA: Diagnosis not present

## 2019-09-17 DIAGNOSIS — M7072 Other bursitis of hip, left hip: Secondary | ICD-10-CM | POA: Diagnosis not present

## 2019-09-21 ENCOUNTER — Telehealth: Payer: Self-pay | Admitting: Orthopaedic Surgery

## 2019-09-21 DIAGNOSIS — M545 Low back pain: Secondary | ICD-10-CM | POA: Diagnosis not present

## 2019-09-21 DIAGNOSIS — M5416 Radiculopathy, lumbar region: Secondary | ICD-10-CM | POA: Diagnosis not present

## 2019-09-21 DIAGNOSIS — M7072 Other bursitis of hip, left hip: Secondary | ICD-10-CM | POA: Diagnosis not present

## 2019-09-21 NOTE — Telephone Encounter (Signed)
Call/voice message received from patient regarding how she can have her MRI report forwarded to her primary care, Dr Manuella Ghazi; I returned call to relay information - no answer or voice mail.

## 2019-09-21 NOTE — Telephone Encounter (Signed)
Done. Patient aware we have sent MRI report to Dr Manuella Ghazi through Release of information portal.

## 2019-09-24 DIAGNOSIS — M5416 Radiculopathy, lumbar region: Secondary | ICD-10-CM | POA: Diagnosis not present

## 2019-09-24 DIAGNOSIS — M7072 Other bursitis of hip, left hip: Secondary | ICD-10-CM | POA: Diagnosis not present

## 2019-09-24 DIAGNOSIS — M545 Low back pain: Secondary | ICD-10-CM | POA: Diagnosis not present

## 2019-09-29 DIAGNOSIS — M545 Low back pain: Secondary | ICD-10-CM | POA: Diagnosis not present

## 2019-09-29 DIAGNOSIS — M7072 Other bursitis of hip, left hip: Secondary | ICD-10-CM | POA: Diagnosis not present

## 2019-09-29 DIAGNOSIS — M5416 Radiculopathy, lumbar region: Secondary | ICD-10-CM | POA: Diagnosis not present

## 2019-09-30 ENCOUNTER — Ambulatory Visit (INDEPENDENT_AMBULATORY_CARE_PROVIDER_SITE_OTHER): Payer: Medicare Other | Admitting: Orthopaedic Surgery

## 2019-09-30 ENCOUNTER — Encounter: Payer: Self-pay | Admitting: Orthopaedic Surgery

## 2019-09-30 ENCOUNTER — Other Ambulatory Visit: Payer: Self-pay

## 2019-09-30 VITALS — BP 135/70 | HR 75 | Ht 66.0 in | Wt 196.0 lb

## 2019-09-30 DIAGNOSIS — G8929 Other chronic pain: Secondary | ICD-10-CM | POA: Diagnosis not present

## 2019-09-30 DIAGNOSIS — M545 Low back pain: Secondary | ICD-10-CM

## 2019-09-30 NOTE — Progress Notes (Signed)
Patient IS:[id number] Angela House, female DOB:[birth year], 84 y.o. [id number]  Chief Complaint  Patient presents with  . Hip Pain    L/feelingsome better/therapy helping alot.    HPI  Angela House is a 84 y.o. female who has lower back pain.  She has been to PT and is feeling better.  I have read the PT notes.  She still has pain but no weakness.  She has less pain when she has the pain.  She is doing her exercises at home.  She is pleased with her progress.   Body mass index is 31.64 kg/m.  ROS  Review of Systems  All other systems reviewed and are negative.  The following is a summary of the past history medically, past history surgically, known current medicines, social history and family history.  This information is gathered electronically by the computer from prior information and documentation.  I review this each visit and have found including this information at this point in the chart is beneficial and informative.    Past Medical History:  Diagnosis Date  . Arthritis   . Hypertension   . PE (pulmonary thromboembolism) ([place])    bilaterally, 03/2017  . Restless leg syndrome     Past Surgical History:  Procedure Laterality Date  . ABDOMINAL HYSTERECTOMY    . CHOLECYSTECTOMY    . EYE SURGERY    . KNEE SURGERY    . WRIST SURGERY      Family History  Problem Relation Age of Onset  . Arthritis Mother   . Hypertension Mother   . Hypertension Sister   . Pneumonia Brother   . Hypertension Sister   . Kidney disease Sister   . Colon cancer Neg Hx     Social History Social History   Tobacco Use  . Smoking status: Never Smoker  . Smokeless tobacco: Never Used  Substance Use Topics  . Alcohol use: No    Alcohol/week: 0.0 standard drinks  . Drug use: No    Allergies  Allergen Reactions  . Ciprofloxacin Hives  . Meclizine Other (See Comments)    dizziness    Current Outpatient Medications  Medication Sig Dispense Refill  . acetaminophen (TYLENOL) 325 MG  tablet Take 325 mg by mouth every 6 (six) hours as needed.    Marland Kitchen apixaban (ELIQUIS) 2.5 MG TABS tablet Take 2.5 mg by mouth 2 (two) times daily.    . ASPERCREME W/LIDOCAINE EX Apply 1 application topically daily as needed (FOR HIP/KNEE PAIN).     Marland Kitchen calcium carbonate (OS-CAL - DOSED IN MG OF ELEMENTAL CALCIUM) 1250 (500 Ca) MG tablet Take 2 tablets by mouth 2 (two) times daily.     . Carboxymethylcellulose Sodium (THERATEARS) 0.25 % SOLN Apply 1 drop to eye as needed.     . cetirizine (ZYRTEC) 10 MG tablet Take 10 mg by mouth daily as needed.     . clobetasol (TEMOVATE) 0.05 % external solution Apply 1 application topically as needed. Applied to scalp    . clobetasol cream (TEMOVATE) AB-123456789 % Apply 1 application topically daily as needed (FOR RASH).     Marland Kitchen clotrimazole-betamethasone (LOTRISONE) cream as needed.     . cycloSPORINE (RESTASIS) 0.05 % ophthalmic emulsion Place 1 drop into both eyes 2 (two) times daily.    . diazepam (VALIUM) 2 MG tablet Take 2 mg by mouth as needed for anxiety.     . famotidine (PEPCID) 20 MG tablet Take 1 tablet (20 mg total) by mouth 2 (two)  times daily. 60 tablet 5  . fluticasone (FLONASE) 50 MCG/ACT nasal spray Place 2 sprays into the nose as needed for allergies or rhinitis.     . furosemide (LASIX) 20 MG tablet Take by mouth as needed.     . gabapentin (NEURONTIN) 100 MG capsule Take 100 mg by mouth 2 (two) times daily.     . Multiple Vitamin (MULTIVITAMIN) capsule Take 1 capsule by mouth every morning.     [name] (SYSTANE) 0.4-0.3 % SOLN Apply 1 drop to eye 4 (four) times daily.    . Potassium 99 MG TABS Take 1 tablet by mouth every other day.     . VENTOLIN HFA 108 (90 Base) MCG/ACT inhaler Inhale 1-2 puffs into the lungs every 6 (six) hours as needed.      No current facility-administered medications for this visit.     Physical Exam  Blood pressure 135/70, pulse 75, height 5\' 6"  (1.676 m), weight 196 lb (88.9  kg).  Constitutional: overall normal hygiene, normal nutrition, well developed, normal grooming, normal body habitus. Assistive device:none  Musculoskeletal: gait and station Limp none, muscle tone and strength are normal, no tremors or atrophy is present.  .  Neurological: coordination overall normal.  Deep tendon reflex/nerve stretch intact.  Sensation normal.  Cranial nerves II-XII intact.   Skin:   Normal overall no scars, lesions, ulcers or rashes. No psoriasis.  Psychiatric: Alert and oriented x 3.  Recent memory intact, remote memory unclear.  Normal mood and affect. Well groomed.  Good eye contact.  Cardiovascular: overall no swelling, no varicosities, no edema bilaterally, normal temperatures of the legs and arms, no clubbing, cyanosis and good capillary refill.  Lymphatic: palpation is normal.  All other systems reviewed and are negative   Spine/Pelvis examination:  Inspection:  Overall, sacoiliac joint benign and hips nontender; without crepitus or defects.   Thoracic spine inspection: Alignment normal without kyphosis present   Lumbar spine inspection:  Alignment  with normal lumbar lordosis, without scoliosis apparent.   Thoracic spine palpation:  without tenderness of spinal processes   Lumbar spine palpation: without tenderness of lumbar area; without tightness of lumbar muscles    Range of Motion:   Lumbar flexion, forward flexion is normal without pain or tenderness    Lumbar extension is full without pain or tenderness   Left lateral bend is normal without pain or tenderness   Right lateral bend is normal without pain or tenderness   Straight leg raising is normal  Strength & tone: normal   Stability overall normal stability  The patient has been educated about the nature of the problem(s) and counseled on treatment options.  The patient appeared to understand what I have discussed and is in agreement with it.  Encounter Diagnosis  Name Primary?  .  Chronic midline low back pain without sciatica Yes    PLAN Call if any problems.  Precautions discussed.  Continue current medications.   Return to clinic 1 month   Continue PT, continue exercises at home.  Electronically Signed Sanjuana Kava, MD 3/31/20219:47 AM

## 2019-10-01 DIAGNOSIS — M5416 Radiculopathy, lumbar region: Secondary | ICD-10-CM | POA: Diagnosis not present

## 2019-10-01 DIAGNOSIS — M545 Low back pain: Secondary | ICD-10-CM | POA: Diagnosis not present

## 2019-10-01 DIAGNOSIS — M7062 Trochanteric bursitis, left hip: Secondary | ICD-10-CM | POA: Diagnosis not present

## 2019-10-01 DIAGNOSIS — M7072 Other bursitis of hip, left hip: Secondary | ICD-10-CM | POA: Diagnosis not present

## 2019-10-05 DIAGNOSIS — M7072 Other bursitis of hip, left hip: Secondary | ICD-10-CM | POA: Diagnosis not present

## 2019-10-05 DIAGNOSIS — M7062 Trochanteric bursitis, left hip: Secondary | ICD-10-CM | POA: Diagnosis not present

## 2019-10-05 DIAGNOSIS — M545 Low back pain: Secondary | ICD-10-CM | POA: Diagnosis not present

## 2019-10-05 DIAGNOSIS — M5416 Radiculopathy, lumbar region: Secondary | ICD-10-CM | POA: Diagnosis not present

## 2019-10-08 ENCOUNTER — Telehealth (INDEPENDENT_AMBULATORY_CARE_PROVIDER_SITE_OTHER): Payer: Medicare Other | Admitting: Cardiovascular Disease

## 2019-10-08 ENCOUNTER — Encounter: Payer: Self-pay | Admitting: Cardiovascular Disease

## 2019-10-08 ENCOUNTER — Telehealth: Payer: Self-pay | Admitting: *Deleted

## 2019-10-08 VITALS — BP 140/80 | Ht 66.0 in | Wt 196.0 lb

## 2019-10-08 DIAGNOSIS — I2699 Other pulmonary embolism without acute cor pulmonale: Secondary | ICD-10-CM

## 2019-10-08 DIAGNOSIS — Z299 Encounter for prophylactic measures, unspecified: Secondary | ICD-10-CM | POA: Diagnosis not present

## 2019-10-08 DIAGNOSIS — R079 Chest pain, unspecified: Secondary | ICD-10-CM | POA: Diagnosis not present

## 2019-10-08 DIAGNOSIS — I10 Essential (primary) hypertension: Secondary | ICD-10-CM

## 2019-10-08 DIAGNOSIS — Z86711 Personal history of pulmonary embolism: Secondary | ICD-10-CM

## 2019-10-08 DIAGNOSIS — M5416 Radiculopathy, lumbar region: Secondary | ICD-10-CM | POA: Diagnosis not present

## 2019-10-08 DIAGNOSIS — K219 Gastro-esophageal reflux disease without esophagitis: Secondary | ICD-10-CM

## 2019-10-08 DIAGNOSIS — M7072 Other bursitis of hip, left hip: Secondary | ICD-10-CM | POA: Diagnosis not present

## 2019-10-08 DIAGNOSIS — R030 Elevated blood-pressure reading, without diagnosis of hypertension: Secondary | ICD-10-CM

## 2019-10-08 DIAGNOSIS — Z789 Other specified health status: Secondary | ICD-10-CM | POA: Diagnosis not present

## 2019-10-08 DIAGNOSIS — M7062 Trochanteric bursitis, left hip: Secondary | ICD-10-CM | POA: Diagnosis not present

## 2019-10-08 DIAGNOSIS — M545 Low back pain: Secondary | ICD-10-CM | POA: Diagnosis not present

## 2019-10-08 DIAGNOSIS — M79609 Pain in unspecified limb: Secondary | ICD-10-CM | POA: Diagnosis not present

## 2019-10-08 NOTE — Telephone Encounter (Signed)
I cannot advise use of oral aloe vera as we don't have enough information regarding safety profile. I generally don't recommend its use.

## 2019-10-08 NOTE — Progress Notes (Signed)
Virtual Visit via Telephone Note   This visit type was conducted due to national recommendations for restrictions regarding the COVID-19 Pandemic (e.g. social distancing) in an effort to limit this patient's exposure and mitigate transmission in our community.  Due to her co-morbid illnesses, this patient is at least at moderate risk for complications without adequate follow up.  This format is felt to be most appropriate for this patient at this time.  The patient did not have access to video technology/had technical difficulties with video requiring transitioning to audio format only (telephone).  All issues noted in this document were discussed and addressed.  No physical exam could be performed with this format.  Please refer to the patient's chart for her  consent to telehealth for [facility].   The patient was identified using 2 identifiers.  Date:  10/08/2019   ID:  Angela House, DOB Mar 30, 1936, MRN [id number]  Patient Location: Home Provider Location: Office  PCP:  Monico Blitz, MD  Cardiologist:  Kate Sable, MD  Electrophysiologist:  None   Evaluation Performed:  Follow-Up Visit  Chief Complaint:  Chest pain  History of Present Illness:    Angela House is a 84 y.o. female with a history of chest pain, GERD, and pulmonary embolism.  She underwent a normal nuclear stress test on 04/27/2019, LVEF 84%.  She very seldom has burning chest pains related to GERD.  She asked me about switching from famotidine to a natural aloe vera supplement and I instructed her that she should speak with her gastroenterologist.   Past Medical History:  Diagnosis Date  . Arthritis   . Hypertension   . PE (pulmonary thromboembolism) ([place])    bilaterally, 03/2017  . Restless leg syndrome    Past Surgical History:  Procedure Laterality Date  . ABDOMINAL HYSTERECTOMY    . CHOLECYSTECTOMY    . EYE SURGERY    . KNEE SURGERY    . WRIST SURGERY       Current Meds  Medication Sig   . acetaminophen (TYLENOL) 325 MG tablet Take 325 mg by mouth every 6 (six) hours as needed.  Marland Kitchen apixaban (ELIQUIS) 2.5 MG TABS tablet Take 2.5 mg by mouth 2 (two) times daily.  . ASPERCREME W/LIDOCAINE EX Apply 1 application topically daily as needed (FOR HIP/KNEE PAIN).   Marland Kitchen calcium carbonate (OS-CAL - DOSED IN MG OF ELEMENTAL CALCIUM) 1250 (500 Ca) MG tablet Take 2 tablets by mouth 2 (two) times daily.   . Carboxymethylcellulose Sodium (THERATEARS) 0.25 % SOLN Apply 1 drop to eye as needed.   . cetirizine (ZYRTEC) 10 MG tablet Take 10 mg by mouth daily as needed.   . clobetasol (TEMOVATE) 0.05 % external solution Apply 1 application topically as needed. Applied to scalp  . clobetasol cream (TEMOVATE) AB-123456789 % Apply 1 application topically daily as needed (FOR RASH).   Marland Kitchen clotrimazole-betamethasone (LOTRISONE) cream as needed.   . cycloSPORINE (RESTASIS) 0.05 % ophthalmic emulsion Place 1 drop into both eyes 2 (two) times daily.  . diazepam (VALIUM) 2 MG tablet Take 2 mg by mouth as needed for anxiety.   . famotidine (PEPCID) 20 MG tablet Take 1 tablet (20 mg total) by mouth 2 (two) times daily.  . fluticasone (FLONASE) 50 MCG/ACT nasal spray Place 2 sprays into the nose as needed for allergies or rhinitis.   . furosemide (LASIX) 20 MG tablet Take by mouth as needed.   . gabapentin (NEURONTIN) 100 MG capsule Take 100 mg by mouth  3 (three) times daily.   . Multiple Vitamin (MULTIVITAMIN) capsule Take 1 capsule by mouth every morning.   [name] (SYSTANE) 0.4-0.3 % SOLN Apply 1 drop to eye 4 (four) times daily.  . Potassium 99 MG TABS Take 1 tablet by mouth every other day.   . VENTOLIN HFA 108 (90 Base) MCG/ACT inhaler Inhale 1-2 puffs into the lungs every 6 (six) hours as needed.      Allergies:   Ciprofloxacin and Meclizine   Social History   Tobacco Use  . Smoking status: Never Smoker  . Smokeless tobacco: Never Used  Substance Use Topics  . Alcohol use: No     Alcohol/week: 0.0 standard drinks  . Drug use: No     Family Hx: The patient's family history includes Arthritis in her mother; Hypertension in her mother, sister, and sister; Kidney disease in her sister; Pneumonia in her brother. There is no history of Colon cancer.  ROS:   Please see the history of present illness.     All other systems reviewed and are negative.   Prior CV studies:   The following studies were reviewed today:  Reviewed above  Labs/Other Tests and Data Reviewed:    EKG:  No ECG reviewed.  Recent Labs: No results found for requested labs within last 8760 hours.   Recent Lipid Panel No results found for: CHOL, TRIG, HDL, CHOLHDL, LDLCALC, LDLDIRECT  Wt Readings from Last 3 Encounters:  10/08/19 196 lb (88.9 kg)  09/30/19 196 lb (88.9 kg)  09/02/19 197 lb 4 oz (89.5 kg)     Objective:    Vital Signs:  BP 140/80   Ht 5\' 6"  (1.676 m)   Wt 196 lb (88.9 kg)   BMI 31.64 kg/m    VITAL SIGNS:  reviewed  ASSESSMENT & PLAN:    1.  Chest pain: She underwent a normal nuclear stress test in October 2020.  LV function was normal.  No further cardiac testing is indicated at this time.  2.  History of pulmonary embolism: This was unprovoked.  She follows with hematology.  She is on low-dose apixaban.  3.  GERD: On Pepcid.  Sees GI.  She asked me about switching from famotidine to a natural aloe vera supplement and I instructed her that she should speak with her gastroenterologist.  4.  Elevated blood pressure: She does not carry diagnosis of hypertension.  BP is mildly elevated.  This will need further monitoring.    COVID-19 Education: The signs and symptoms of COVID-19 were discussed with the patient and how to seek care for testing (follow up with PCP or arrange E-visit).  The importance of social distancing was discussed today.  Time:   Today, I have spent 15 minutes with the patient with telehealth technology discussing the above problems.      Medication Adjustments/Labs and Tests Ordered: Current medicines are reviewed at length with the patient today.  Concerns regarding medicines are outlined above.   Tests Ordered: No orders of the defined types were placed in this encounter.   Medication Changes: No orders of the defined types were placed in this encounter.   Follow Up:  Virtual Visit  prn  Signed, Kate Sable, MD  10/08/2019 9:36 AM    [place]

## 2019-10-08 NOTE — Patient Instructions (Signed)
Your physician recommends that you schedule a follow-up appointment in: AS NEEDED WITH DR KONESWARAN  Your physician recommends that you continue on your current medications as directed. Please refer to the Current Medication list given to you today.  Thank you for choosing [facility]!!    

## 2019-10-08 NOTE — Telephone Encounter (Signed)
Pt would like to switch Famotidine to Aloe Vera natural supplement for digestive health.  Pt said that she would prefer being on a natural supplement.  She wanted to make sure it would be ok with [place] before doing so.  Routing to [facility] in RMR's absence.  [phone]

## 2019-10-09 NOTE — Telephone Encounter (Signed)
Called pt and informed her that [facility] generally doesn't recommend aloe vera oral since we don't have enough information regarding safety profile.  Pt voiced understanding.

## 2019-10-12 DIAGNOSIS — M5416 Radiculopathy, lumbar region: Secondary | ICD-10-CM | POA: Diagnosis not present

## 2019-10-12 DIAGNOSIS — M545 Low back pain: Secondary | ICD-10-CM | POA: Diagnosis not present

## 2019-10-12 DIAGNOSIS — M7062 Trochanteric bursitis, left hip: Secondary | ICD-10-CM | POA: Diagnosis not present

## 2019-10-12 DIAGNOSIS — M7072 Other bursitis of hip, left hip: Secondary | ICD-10-CM | POA: Diagnosis not present

## 2019-10-15 DIAGNOSIS — M7062 Trochanteric bursitis, left hip: Secondary | ICD-10-CM | POA: Diagnosis not present

## 2019-10-15 DIAGNOSIS — M545 Low back pain: Secondary | ICD-10-CM | POA: Diagnosis not present

## 2019-10-15 DIAGNOSIS — M7072 Other bursitis of hip, left hip: Secondary | ICD-10-CM | POA: Diagnosis not present

## 2019-10-15 DIAGNOSIS — M5416 Radiculopathy, lumbar region: Secondary | ICD-10-CM | POA: Diagnosis not present

## 2019-10-19 DIAGNOSIS — M5416 Radiculopathy, lumbar region: Secondary | ICD-10-CM | POA: Diagnosis not present

## 2019-10-19 DIAGNOSIS — M7072 Other bursitis of hip, left hip: Secondary | ICD-10-CM | POA: Diagnosis not present

## 2019-10-19 DIAGNOSIS — M7062 Trochanteric bursitis, left hip: Secondary | ICD-10-CM | POA: Diagnosis not present

## 2019-10-19 DIAGNOSIS — M545 Low back pain: Secondary | ICD-10-CM | POA: Diagnosis not present

## 2019-10-22 DIAGNOSIS — M545 Low back pain: Secondary | ICD-10-CM | POA: Diagnosis not present

## 2019-10-22 DIAGNOSIS — M7072 Other bursitis of hip, left hip: Secondary | ICD-10-CM | POA: Diagnosis not present

## 2019-10-22 DIAGNOSIS — M7062 Trochanteric bursitis, left hip: Secondary | ICD-10-CM | POA: Diagnosis not present

## 2019-10-22 DIAGNOSIS — M5416 Radiculopathy, lumbar region: Secondary | ICD-10-CM | POA: Diagnosis not present

## 2019-10-26 DIAGNOSIS — M7062 Trochanteric bursitis, left hip: Secondary | ICD-10-CM | POA: Diagnosis not present

## 2019-10-26 DIAGNOSIS — M545 Low back pain: Secondary | ICD-10-CM | POA: Diagnosis not present

## 2019-10-26 DIAGNOSIS — M7072 Other bursitis of hip, left hip: Secondary | ICD-10-CM | POA: Diagnosis not present

## 2019-10-26 DIAGNOSIS — M5416 Radiculopathy, lumbar region: Secondary | ICD-10-CM | POA: Diagnosis not present

## 2019-10-26 DIAGNOSIS — L910 Hypertrophic scar: Secondary | ICD-10-CM | POA: Diagnosis not present

## 2019-10-26 DIAGNOSIS — R208 Other disturbances of skin sensation: Secondary | ICD-10-CM | POA: Diagnosis not present

## 2019-10-28 ENCOUNTER — Encounter: Payer: Self-pay | Admitting: Orthopaedic Surgery

## 2019-10-28 ENCOUNTER — Ambulatory Visit (INDEPENDENT_AMBULATORY_CARE_PROVIDER_SITE_OTHER): Payer: Medicare Other | Admitting: Orthopaedic Surgery

## 2019-10-28 ENCOUNTER — Other Ambulatory Visit: Payer: Self-pay

## 2019-10-28 VITALS — BP 113/66 | HR 65 | Ht 66.0 in | Wt 196.0 lb

## 2019-10-28 DIAGNOSIS — M545 Low back pain, unspecified: Secondary | ICD-10-CM

## 2019-10-28 DIAGNOSIS — G8929 Other chronic pain: Secondary | ICD-10-CM | POA: Diagnosis not present

## 2019-10-28 NOTE — Progress Notes (Signed)
Patient [id number] E Soter, female DOB:January 08, 1936, 84 y.o. [name]  Chief Complaint  Patient presents with  . Back Pain    Better    HPI  Angela House is a 84 y.o. female who has been going to PT for her lower back pain and conditioning exercises.  She is getting better.  She has more strength and her back pain is less.  She has no new trauma, no weakness.  I have read her PT notes.  She still has pain but much less.   Body mass index is 31.64 kg/m.  ROS  Review of Systems  All other systems reviewed and are negative.  The following is a summary of the past history medically, past history surgically, known current medicines, social history and family history.  This information is gathered electronically by the computer from prior information and documentation.  I review this each visit and have found including this information at this point in the chart is beneficial and informative.    Past Medical History:  Diagnosis Date  . Arthritis   . Hypertension   . PE (pulmonary thromboembolism) ([place])    bilaterally, 03/2017  . Restless leg syndrome     Past Surgical History:  Procedure Laterality Date  . ABDOMINAL HYSTERECTOMY    . CHOLECYSTECTOMY    . EYE SURGERY    . KNEE SURGERY    . WRIST SURGERY      Family History  Problem Relation Age of Onset  . Arthritis Mother   . Hypertension Mother   . Hypertension Sister   . Pneumonia Brother   . Hypertension Sister   . Kidney disease Sister   . Colon cancer Neg Hx     Social History Social History   Tobacco Use  . Smoking status: Never Smoker  . Smokeless tobacco: Never Used  Substance Use Topics  . Alcohol use: No    Alcohol/week: 0.0 standard drinks  . Drug use: No    Allergies  Allergen Reactions  . Ciprofloxacin Hives  . Meclizine Other (See Comments)    dizziness    Current Outpatient Medications  Medication Sig Dispense Refill  . lisinopril (ZESTRIL) 10 MG tablet Take 10 mg by mouth daily.     Marland Kitchen acetaminophen (TYLENOL) 325 MG tablet Take 325 mg by mouth every 6 (six) hours as needed.    Marland Kitchen apixaban (ELIQUIS) 2.5 MG TABS tablet Take 2.5 mg by mouth 2 (two) times daily.    . ASPERCREME W/LIDOCAINE EX Apply 1 application topically daily as needed (FOR HIP/KNEE PAIN).     Marland Kitchen calcium carbonate (OS-CAL - DOSED IN MG OF ELEMENTAL CALCIUM) 1250 (500 Ca) MG tablet Take 2 tablets by mouth 2 (two) times daily.     . Carboxymethylcellulose Sodium (THERATEARS) 0.25 % SOLN Apply 1 drop to eye as needed.     . cetirizine (ZYRTEC) 10 MG tablet Take 10 mg by mouth daily as needed.     . clobetasol (TEMOVATE) 0.05 % external solution Apply 1 application topically as needed. Applied to scalp    . clobetasol cream (TEMOVATE) AB-123456789 % Apply 1 application topically daily as needed (FOR RASH).     Marland Kitchen clotrimazole-betamethasone (LOTRISONE) cream as needed.     . cycloSPORINE (RESTASIS) 0.05 % ophthalmic emulsion Place 1 drop into both eyes 2 (two) times daily.    . diazepam (VALIUM) 2 MG tablet Take 2 mg by mouth as needed for anxiety.     . famotidine (PEPCID) 20 MG tablet  Take 1 tablet (20 mg total) by mouth 2 (two) times daily. 60 tablet 5  . fluticasone (FLONASE) 50 MCG/ACT nasal spray Place 2 sprays into the nose as needed for allergies or rhinitis.     . furosemide (LASIX) 20 MG tablet Take by mouth as needed.     . gabapentin (NEURONTIN) 100 MG capsule Take 100 mg by mouth 3 (three) times daily.     . Multiple Vitamin (MULTIVITAMIN) capsule Take 1 capsule by mouth every morning.     [name] (SYSTANE) 0.4-0.3 % SOLN Apply 1 drop to eye 4 (four) times daily.    . Potassium 99 MG TABS Take 1 tablet by mouth every other day.     . VENTOLIN HFA 108 (90 Base) MCG/ACT inhaler Inhale 1-2 puffs into the lungs every 6 (six) hours as needed.      No current facility-administered medications for this visit.     Physical Exam  Blood pressure 113/66, pulse 65, height 5\' 6"  (1.676 m),  weight 196 lb (88.9 kg).  Constitutional: overall normal hygiene, normal nutrition, well developed, normal grooming, normal body habitus. Assistive device:none  Musculoskeletal: gait and station Limp none, muscle tone and strength are normal, no tremors or atrophy is present.  .  Neurological: coordination overall normal.  Deep tendon reflex/nerve stretch intact.  Sensation normal.  Cranial nerves II-XII intact.   Skin:   Normal overall no scars, lesions, ulcers or rashes. No psoriasis.  Psychiatric: Alert and oriented x 3.  Recent memory intact, remote memory unclear.  Normal mood and affect. Well groomed.  Good eye contact.  Cardiovascular: overall no swelling, no varicosities, no edema bilaterally, normal temperatures of the legs and arms, no clubbing, cyanosis and good capillary refill.  Lymphatic: palpation is normal.  Spine/Pelvis examination:  Inspection:  Overall, sacoiliac joint benign and hips nontender; without crepitus or defects.   Thoracic spine inspection: Alignment normal without kyphosis present   Lumbar spine inspection:  Alignment  with normal lumbar lordosis, without scoliosis apparent.   Thoracic spine palpation:  without tenderness of spinal processes   Lumbar spine palpation: without tenderness of lumbar area; without tightness of lumbar muscles    Range of Motion:   Lumbar flexion, forward flexion is normal without pain or tenderness    Lumbar extension is full without pain or tenderness   Left lateral bend is normal without pain or tenderness   Right lateral bend is normal without pain or tenderness   Straight leg raising is normal  Strength & tone: normal   Stability overall normal stability  All other systems reviewed and are negative   The patient has been educated about the nature of the problem(s) and counseled on treatment options.  The patient appeared to understand what I have discussed and is in agreement with it.  Encounter Diagnosis  Name  Primary?  . Chronic midline low back pain without sciatica Yes    PLAN Call if any problems.  Precautions discussed.  Continue current medications.   Return to clinic 1 month   Continue PT.  Electronically Signed Sanjuana Kava, MD 4/28/20219:49 AM

## 2019-10-29 DIAGNOSIS — M5416 Radiculopathy, lumbar region: Secondary | ICD-10-CM | POA: Diagnosis not present

## 2019-10-29 DIAGNOSIS — M7072 Other bursitis of hip, left hip: Secondary | ICD-10-CM | POA: Diagnosis not present

## 2019-10-29 DIAGNOSIS — M7062 Trochanteric bursitis, left hip: Secondary | ICD-10-CM | POA: Diagnosis not present

## 2019-10-29 DIAGNOSIS — M545 Low back pain: Secondary | ICD-10-CM | POA: Diagnosis not present

## 2019-11-02 DIAGNOSIS — M545 Low back pain: Secondary | ICD-10-CM | POA: Diagnosis not present

## 2019-11-02 DIAGNOSIS — M7072 Other bursitis of hip, left hip: Secondary | ICD-10-CM | POA: Diagnosis not present

## 2019-11-02 DIAGNOSIS — M5416 Radiculopathy, lumbar region: Secondary | ICD-10-CM | POA: Diagnosis not present

## 2019-11-05 DIAGNOSIS — M5416 Radiculopathy, lumbar region: Secondary | ICD-10-CM | POA: Diagnosis not present

## 2019-11-05 DIAGNOSIS — M545 Low back pain: Secondary | ICD-10-CM | POA: Diagnosis not present

## 2019-11-05 DIAGNOSIS — M7072 Other bursitis of hip, left hip: Secondary | ICD-10-CM | POA: Diagnosis not present

## 2019-11-19 DIAGNOSIS — Z299 Encounter for prophylactic measures, unspecified: Secondary | ICD-10-CM | POA: Diagnosis not present

## 2019-11-19 DIAGNOSIS — I10 Essential (primary) hypertension: Secondary | ICD-10-CM | POA: Diagnosis not present

## 2019-11-19 DIAGNOSIS — L600 Ingrowing nail: Secondary | ICD-10-CM | POA: Diagnosis not present

## 2019-11-19 DIAGNOSIS — I2699 Other pulmonary embolism without acute cor pulmonale: Secondary | ICD-10-CM | POA: Diagnosis not present

## 2019-11-24 DIAGNOSIS — L910 Hypertrophic scar: Secondary | ICD-10-CM | POA: Diagnosis not present

## 2019-11-25 ENCOUNTER — Other Ambulatory Visit: Payer: Self-pay

## 2019-11-25 ENCOUNTER — Encounter: Payer: Self-pay | Admitting: Orthopaedic Surgery

## 2019-11-25 ENCOUNTER — Ambulatory Visit (INDEPENDENT_AMBULATORY_CARE_PROVIDER_SITE_OTHER): Payer: Medicare Other | Admitting: Orthopaedic Surgery

## 2019-11-25 VITALS — BP 113/75 | HR 64 | Ht 66.0 in | Wt 194.0 lb

## 2019-11-25 DIAGNOSIS — M545 Low back pain: Secondary | ICD-10-CM | POA: Diagnosis not present

## 2019-11-25 DIAGNOSIS — G8929 Other chronic pain: Secondary | ICD-10-CM | POA: Diagnosis not present

## 2019-11-25 NOTE — Progress Notes (Signed)
Patient Angela House, female DOB:1935-11-05, 84 y.o. [name]  Chief Complaint  Patient presents with  . Back Pain    feels much better/finished PT/doing exercises at home/ walking and have lost 2 lbs    HPI  Angela House is a 84 y.o. female who has lower back pain.  She has been going to PT and is improving.  She has less pain, no weakness. She is doing her exercises at home.  She has no trauma.  Overall, she feels much better. I have reviewed the PT notes.   Body mass index is 31.31 kg/m.  ROS  Review of Systems  All other systems reviewed and are negative.  The following is a summary of the past history medically, past history surgically, known current medicines, social history and family history.  This information is gathered electronically by the computer from prior information and documentation.  I review this each visit and have found including this information at this point in the chart is beneficial and informative.    Past Medical History:  Diagnosis Date  . Arthritis   . Hypertension   . PE (pulmonary thromboembolism) ([place])    bilaterally, 03/2017  . Restless leg syndrome     Past Surgical History:  Procedure Laterality Date  . ABDOMINAL HYSTERECTOMY    . CHOLECYSTECTOMY    . EYE SURGERY    . KNEE SURGERY    . WRIST SURGERY      Family History  Problem Relation Age of Onset  . Arthritis Mother   . Hypertension Mother   . Hypertension Sister   . Pneumonia Brother   . Hypertension Sister   . Kidney disease Sister   . Colon cancer Neg Hx     Social History Social History   Tobacco Use  . Smoking status: Never Smoker  . Smokeless tobacco: Never Used  Substance Use Topics  . Alcohol use: No    Alcohol/week: 0.0 standard drinks  . Drug use: No    Allergies  Allergen Reactions  . Ciprofloxacin Hives  . Meclizine Other (See Comments)    dizziness    Current Outpatient Medications  Medication Sig Dispense Refill  . acetaminophen  (TYLENOL) 325 MG tablet Take 325 mg by mouth every 6 (six) hours as needed.    Marland Kitchen apixaban (ELIQUIS) 2.5 MG TABS tablet Take 2.5 mg by mouth 2 (two) times daily.    . ASPERCREME W/LIDOCAINE EX Apply 1 application topically daily as needed (FOR HIP/KNEE PAIN).     Marland Kitchen calcium carbonate (OS-CAL - DOSED IN MG OF ELEMENTAL CALCIUM) 1250 (500 Ca) MG tablet Take 2 tablets by mouth 2 (two) times daily.     . Carboxymethylcellulose Sodium (THERATEARS) 0.25 % SOLN Apply 1 drop to eye as needed.     . cetirizine (ZYRTEC) 10 MG tablet Take 10 mg by mouth daily as needed.     . clobetasol (TEMOVATE) 0.05 % external solution Apply 1 application topically as needed. Applied to scalp    . clobetasol cream (TEMOVATE) AB-123456789 % Apply 1 application topically daily as needed (FOR RASH).     Marland Kitchen clotrimazole-betamethasone (LOTRISONE) cream as needed.     . cycloSPORINE (RESTASIS) 0.05 % ophthalmic emulsion Place 1 drop into both eyes 2 (two) times daily.    . diazepam (VALIUM) 2 MG tablet Take 2 mg by mouth as needed for anxiety.     . famotidine (PEPCID) 20 MG tablet Take 1 tablet (20 mg total) by mouth 2 (two)  times daily. 60 tablet 5  . fluticasone (FLONASE) 50 MCG/ACT nasal spray Place 2 sprays into the nose as needed for allergies or rhinitis.     . furosemide (LASIX) 20 MG tablet Take by mouth as needed.     . gabapentin (NEURONTIN) 100 MG capsule Take 100 mg by mouth 3 (three) times daily.     Marland Kitchen lisinopril (ZESTRIL) 10 MG tablet Take 10 mg by mouth daily.    . Multiple Vitamin (MULTIVITAMIN) capsule Take 1 capsule by mouth every morning.     [name] (SYSTANE) 0.4-0.3 % SOLN Apply 1 drop to eye 4 (four) times daily.    . Potassium 99 MG TABS Take 1 tablet by mouth every other day.     . VENTOLIN HFA 108 (90 Base) MCG/ACT inhaler Inhale 1-2 puffs into the lungs every 6 (six) hours as needed.      No current facility-administered medications for this visit.     Physical Exam  Blood  pressure 113/75, pulse 64, height 5\' 6"  (1.676 m), weight 194 lb (88 kg).  Constitutional: overall normal hygiene, normal nutrition, well developed, normal grooming, normal body habitus. Assistive device:none  Musculoskeletal: gait and station Limp none, muscle tone and strength are normal, no tremors or atrophy is present.  .  Neurological: coordination overall normal.  Deep tendon reflex/nerve stretch intact.  Sensation normal.  Cranial nerves II-XII intact.   Skin:   Normal overall no scars, lesions, ulcers or rashes. No psoriasis.  Psychiatric: Alert and oriented x 3.  Recent memory intact, remote memory unclear.  Normal mood and affect. Well groomed.  Good eye contact.  Cardiovascular: overall no swelling, no varicosities, no edema bilaterally, normal temperatures of the legs and arms, no clubbing, cyanosis and good capillary refill.  Lymphatic: palpation is normal.  Spine/Pelvis examination:  Inspection:  Overall, sacoiliac joint benign and hips nontender; without crepitus or defects.   Thoracic spine inspection: Alignment normal without kyphosis present   Lumbar spine inspection:  Alignment  with normal lumbar lordosis, without scoliosis apparent.   Thoracic spine palpation:  without tenderness of spinal processes   Lumbar spine palpation: without tenderness of lumbar area; without tightness of lumbar muscles    Range of Motion:   Lumbar flexion, forward flexion is normal without pain or tenderness    Lumbar extension is full without pain or tenderness   Left lateral bend is normal without pain or tenderness   Right lateral bend is normal without pain or tenderness   Straight leg raising is normal  Strength & tone: normal   Stability overall normal stability  All other systems reviewed and are negative   The patient has been educated about the nature of the problem(s) and counseled on treatment options.  The patient appeared to understand what I have discussed and is in  agreement with it.  Encounter Diagnosis  Name Primary?  . Chronic midline low back pain without sciatica Yes    PLAN Call if any problems.  Precautions discussed.  Continue current medications.   Return to clinic 6 weeks   Electronically Signed Sanjuana Kava, MD 5/26/202110:55 AM

## 2019-12-10 DIAGNOSIS — M79671 Pain in right foot: Secondary | ICD-10-CM | POA: Diagnosis not present

## 2019-12-10 DIAGNOSIS — I739 Peripheral vascular disease, unspecified: Secondary | ICD-10-CM | POA: Diagnosis not present

## 2019-12-10 DIAGNOSIS — M79674 Pain in right toe(s): Secondary | ICD-10-CM | POA: Diagnosis not present

## 2019-12-10 DIAGNOSIS — M79672 Pain in left foot: Secondary | ICD-10-CM | POA: Diagnosis not present

## 2019-12-10 DIAGNOSIS — L110 Acquired keratosis follicularis: Secondary | ICD-10-CM | POA: Diagnosis not present

## 2019-12-16 DIAGNOSIS — N183 Chronic kidney disease, stage 3 unspecified: Secondary | ICD-10-CM | POA: Diagnosis not present

## 2019-12-16 DIAGNOSIS — Z299 Encounter for prophylactic measures, unspecified: Secondary | ICD-10-CM | POA: Diagnosis not present

## 2019-12-16 DIAGNOSIS — H9201 Otalgia, right ear: Secondary | ICD-10-CM | POA: Diagnosis not present

## 2019-12-16 DIAGNOSIS — I10 Essential (primary) hypertension: Secondary | ICD-10-CM | POA: Diagnosis not present

## 2019-12-16 DIAGNOSIS — I2699 Other pulmonary embolism without acute cor pulmonale: Secondary | ICD-10-CM | POA: Diagnosis not present

## 2019-12-17 ENCOUNTER — Other Ambulatory Visit: Payer: Self-pay | Admitting: Nurse Practitioner

## 2019-12-23 DIAGNOSIS — H5211 Myopia, right eye: Secondary | ICD-10-CM | POA: Diagnosis not present

## 2019-12-23 DIAGNOSIS — H1045 Other chronic allergic conjunctivitis: Secondary | ICD-10-CM | POA: Diagnosis not present

## 2019-12-23 DIAGNOSIS — Z961 Presence of intraocular lens: Secondary | ICD-10-CM | POA: Diagnosis not present

## 2019-12-23 DIAGNOSIS — H16223 Keratoconjunctivitis sicca, not specified as Sjogren's, bilateral: Secondary | ICD-10-CM | POA: Diagnosis not present

## 2019-12-23 DIAGNOSIS — D3131 Benign neoplasm of right choroid: Secondary | ICD-10-CM | POA: Diagnosis not present

## 2019-12-23 DIAGNOSIS — D3132 Benign neoplasm of left choroid: Secondary | ICD-10-CM | POA: Diagnosis not present

## 2019-12-31 DIAGNOSIS — I2699 Other pulmonary embolism without acute cor pulmonale: Secondary | ICD-10-CM | POA: Diagnosis not present

## 2019-12-31 DIAGNOSIS — Z7901 Long term (current) use of anticoagulants: Secondary | ICD-10-CM | POA: Diagnosis not present

## 2020-01-06 ENCOUNTER — Encounter: Payer: Self-pay | Admitting: Orthopaedic Surgery

## 2020-01-06 ENCOUNTER — Other Ambulatory Visit: Payer: Self-pay

## 2020-01-06 ENCOUNTER — Ambulatory Visit (INDEPENDENT_AMBULATORY_CARE_PROVIDER_SITE_OTHER): Payer: Medicare Other | Admitting: Orthopaedic Surgery

## 2020-01-06 VITALS — BP 115/76 | HR 64 | Ht 64.0 in | Wt 194.4 lb

## 2020-01-06 DIAGNOSIS — G8929 Other chronic pain: Secondary | ICD-10-CM | POA: Diagnosis not present

## 2020-01-06 DIAGNOSIS — M79671 Pain in right foot: Secondary | ICD-10-CM | POA: Diagnosis not present

## 2020-01-06 DIAGNOSIS — M545 Low back pain: Secondary | ICD-10-CM

## 2020-01-06 NOTE — Progress Notes (Signed)
Patient Angela House, female DOB:[birth year], 84 y.o. [id number]  Chief Complaint  Patient presents with  . Back Pain    My back is doing good  . Ankle Pain    R/swelling some and it hurts    HPI  Angela House is a 84 y.o. female who has some right foot pain.  It is more dorsal.  She has no redness no swelling.  She is not taking anything for it.  She is on Eliquis and cannot take NSAIDs.  She has no trauma.  Sometimes it is not painful, other times it is.  Her lower back is tender but not as sore as it was during the winter.  She is active and is trying to do walking when her right foot is not too tender.  She has no weakness, no numbness.   Body mass index is 33.36 kg/m.  ROS  Review of Systems  All other systems reviewed and are negative.  The following is a summary of the past history medically, past history surgically, known current medicines, social history and family history.  This information is gathered electronically by the computer from prior information and documentation.  I review this each visit and have found including this information at this point in the chart is beneficial and informative.    Past Medical History:  Diagnosis Date  . Arthritis   . Hypertension   . PE (pulmonary thromboembolism) ([place])    bilaterally, 03/2017  . Restless leg syndrome     Past Surgical History:  Procedure Laterality Date  . ABDOMINAL HYSTERECTOMY    . CHOLECYSTECTOMY    . EYE SURGERY    . KNEE SURGERY    . WRIST SURGERY      Family History  Problem Relation Age of Onset  . Arthritis Mother   . Hypertension Mother   . Hypertension Sister   . Pneumonia Brother   . Hypertension Sister   . Kidney disease Sister   . Colon cancer Neg Hx     Social History Social History   Tobacco Use  . Smoking status: Never Smoker  . Smokeless tobacco: Never Used  Vaping Use  . Vaping Use: Never used  Substance Use Topics  . Alcohol use: No    Alcohol/week: 0.0  standard drinks  . Drug use: No    Allergies  Allergen Reactions  . Ciprofloxacin Hives  . Meclizine Other (See Comments)    dizziness    Current Outpatient Medications  Medication Sig Dispense Refill  . acetaminophen (TYLENOL) 325 MG tablet Take 325 mg by mouth every 6 (six) hours as needed.    Marland Kitchen apixaban (ELIQUIS) 2.5 MG TABS tablet Take 2.5 mg by mouth 2 (two) times daily.    . ASPERCREME W/LIDOCAINE EX Apply 1 application topically daily as needed (FOR HIP/KNEE PAIN).     Marland Kitchen calcium carbonate (OS-CAL - DOSED IN MG OF ELEMENTAL CALCIUM) 1250 (500 Ca) MG tablet Take 2 tablets by mouth 2 (two) times daily.     . Carboxymethylcellulose Sodium (THERATEARS) 0.25 % SOLN Apply 1 drop to eye as needed.     . cetirizine (ZYRTEC) 10 MG tablet Take 10 mg by mouth daily as needed.     . clobetasol (TEMOVATE) 0.05 % external solution Apply 1 application topically as needed. Applied to scalp    . clobetasol cream (TEMOVATE) 3.24 % Apply 1 application topically daily as needed (FOR RASH).     Marland Kitchen clotrimazole-betamethasone (LOTRISONE) cream as needed.     Marland Kitchen  cycloSPORINE (RESTASIS) 0.05 % ophthalmic emulsion Place 1 drop into both eyes 2 (two) times daily.    . diazepam (VALIUM) 2 MG tablet Take 2 mg by mouth as needed for anxiety.     . famotidine (PEPCID) 20 MG tablet Take 1 tablet by mouth twice daily 60 tablet 3  . fluticasone (FLONASE) 50 MCG/ACT nasal spray Place 2 sprays into the nose as needed for allergies or rhinitis.     . furosemide (LASIX) 20 MG tablet Take by mouth as needed.     . gabapentin (NEURONTIN) 100 MG capsule Take 100 mg by mouth 3 (three) times daily.     Marland Kitchen lisinopril (ZESTRIL) 10 MG tablet Take 10 mg by mouth daily.    . Multiple Vitamin (MULTIVITAMIN) capsule Take 1 capsule by mouth every morning.     [name] (SYSTANE) 0.4-0.3 % SOLN Apply 1 drop to eye 4 (four) times daily.    . Potassium 99 MG TABS Take 1 tablet by mouth every other day.     .  VENTOLIN HFA 108 (90 Base) MCG/ACT inhaler Inhale 1-2 puffs into the lungs every 6 (six) hours as needed.      No current facility-administered medications for this visit.     Physical Exam  Blood pressure 115/76, pulse 64, height 5\' 4"  (1.626 m), weight 194 lb 6 oz (88.2 kg).  Constitutional: overall normal hygiene, normal nutrition, well developed, normal grooming, normal body habitus. Assistive device:none  Musculoskeletal: gait and station Limp none, muscle tone and strength are normal, no tremors or atrophy is present.  .  Neurological: coordination overall normal.  Deep tendon reflex/nerve stretch intact.  Sensation normal.  Cranial nerves II-XII intact.   Skin:   Normal overall no scars, lesions, ulcers or rashes. No psoriasis.  Psychiatric: Alert and oriented x 3.  Recent memory intact, remote memory unclear.  Normal mood and affect. Well groomed.  Good eye contact.  Cardiovascular: overall no swelling, no varicosities, no edema bilaterally, normal temperatures of the legs and arms, no clubbing, cyanosis and good capillary refill.  Lymphatic: palpation is normal.  Right foot has no swelling, no redness, Full ROM of the ankle and normal gait.  Spine/Pelvis examination:  Inspection:  Overall, sacoiliac joint benign and hips nontender; without crepitus or defects.   Thoracic spine inspection: Alignment normal without kyphosis present   Lumbar spine inspection:  Alignment  with normal lumbar lordosis, without scoliosis apparent.   Thoracic spine palpation:  without tenderness of spinal processes   Lumbar spine palpation: without tenderness of lumbar area; without tightness of lumbar muscles    Range of Motion:   Lumbar flexion, forward flexion is normal without pain or tenderness    Lumbar extension is full without pain or tenderness   Left lateral bend is normal without pain or tenderness   Right lateral bend is normal without pain or tenderness   Straight leg raising  is normal  Strength & tone: normal   Stability overall normal stability  All other systems reviewed and are negative   The patient has been educated about the nature of the problem(s) and counseled on treatment options.  The patient appeared to understand what I have discussed and is in agreement with it.  Encounter Diagnoses  Name Primary?  . Right foot pain Yes  . Chronic midline low back pain without sciatica     PLAN Call if any problems.  Precautions discussed.  Continue current medications. Try Aspercreme, Biofreeze or Voltaren  Gel to the foot.  Return to clinic 2 months   Electronically Signed Sanjuana Kava, MD 7/7/202110:26 AM

## 2020-01-07 DIAGNOSIS — I2699 Other pulmonary embolism without acute cor pulmonale: Secondary | ICD-10-CM | POA: Diagnosis not present

## 2020-01-07 DIAGNOSIS — Z7901 Long term (current) use of anticoagulants: Secondary | ICD-10-CM | POA: Diagnosis not present

## 2020-01-21 DIAGNOSIS — Z299 Encounter for prophylactic measures, unspecified: Secondary | ICD-10-CM | POA: Diagnosis not present

## 2020-01-21 DIAGNOSIS — Z0000 Encounter for general adult medical examination without abnormal findings: Secondary | ICD-10-CM | POA: Diagnosis not present

## 2020-01-21 DIAGNOSIS — Z79899 Other long term (current) drug therapy: Secondary | ICD-10-CM | POA: Diagnosis not present

## 2020-01-21 DIAGNOSIS — Z7189 Other specified counseling: Secondary | ICD-10-CM | POA: Diagnosis not present

## 2020-01-21 DIAGNOSIS — E7800 Pure hypercholesterolemia, unspecified: Secondary | ICD-10-CM | POA: Diagnosis not present

## 2020-01-21 DIAGNOSIS — I10 Essential (primary) hypertension: Secondary | ICD-10-CM | POA: Diagnosis not present

## 2020-01-21 DIAGNOSIS — R5383 Other fatigue: Secondary | ICD-10-CM | POA: Diagnosis not present

## 2020-01-21 DIAGNOSIS — E559 Vitamin D deficiency, unspecified: Secondary | ICD-10-CM | POA: Diagnosis not present

## 2020-01-22 DIAGNOSIS — I739 Peripheral vascular disease, unspecified: Secondary | ICD-10-CM | POA: Diagnosis not present

## 2020-01-22 DIAGNOSIS — I10 Essential (primary) hypertension: Secondary | ICD-10-CM | POA: Diagnosis not present

## 2020-01-22 DIAGNOSIS — H6123 Impacted cerumen, bilateral: Secondary | ICD-10-CM | POA: Diagnosis not present

## 2020-01-22 DIAGNOSIS — N183 Chronic kidney disease, stage 3 unspecified: Secondary | ICD-10-CM | POA: Diagnosis not present

## 2020-01-22 DIAGNOSIS — Z299 Encounter for prophylactic measures, unspecified: Secondary | ICD-10-CM | POA: Diagnosis not present

## 2020-01-29 DIAGNOSIS — I739 Peripheral vascular disease, unspecified: Secondary | ICD-10-CM | POA: Diagnosis not present

## 2020-01-29 DIAGNOSIS — N183 Chronic kidney disease, stage 3 unspecified: Secondary | ICD-10-CM | POA: Diagnosis not present

## 2020-01-29 DIAGNOSIS — I129 Hypertensive chronic kidney disease with stage 1 through stage 4 chronic kidney disease, or unspecified chronic kidney disease: Secondary | ICD-10-CM | POA: Diagnosis not present

## 2020-01-29 DIAGNOSIS — M1990 Unspecified osteoarthritis, unspecified site: Secondary | ICD-10-CM | POA: Diagnosis not present

## 2020-02-05 DIAGNOSIS — I10 Essential (primary) hypertension: Secondary | ICD-10-CM | POA: Diagnosis not present

## 2020-02-05 DIAGNOSIS — H6090 Unspecified otitis externa, unspecified ear: Secondary | ICD-10-CM | POA: Diagnosis not present

## 2020-02-05 DIAGNOSIS — I739 Peripheral vascular disease, unspecified: Secondary | ICD-10-CM | POA: Diagnosis not present

## 2020-02-05 DIAGNOSIS — I2699 Other pulmonary embolism without acute cor pulmonale: Secondary | ICD-10-CM | POA: Diagnosis not present

## 2020-02-05 DIAGNOSIS — Z299 Encounter for prophylactic measures, unspecified: Secondary | ICD-10-CM | POA: Diagnosis not present

## 2020-02-05 DIAGNOSIS — N183 Chronic kidney disease, stage 3 unspecified: Secondary | ICD-10-CM | POA: Diagnosis not present

## 2020-02-25 DIAGNOSIS — K219 Gastro-esophageal reflux disease without esophagitis: Secondary | ICD-10-CM | POA: Diagnosis not present

## 2020-02-25 DIAGNOSIS — J069 Acute upper respiratory infection, unspecified: Secondary | ICD-10-CM | POA: Diagnosis not present

## 2020-02-25 DIAGNOSIS — Z299 Encounter for prophylactic measures, unspecified: Secondary | ICD-10-CM | POA: Diagnosis not present

## 2020-02-25 DIAGNOSIS — I739 Peripheral vascular disease, unspecified: Secondary | ICD-10-CM | POA: Diagnosis not present

## 2020-02-25 DIAGNOSIS — I10 Essential (primary) hypertension: Secondary | ICD-10-CM | POA: Diagnosis not present

## 2020-02-25 DIAGNOSIS — Z1159 Encounter for screening for other viral diseases: Secondary | ICD-10-CM | POA: Diagnosis not present

## 2020-03-01 DIAGNOSIS — N183 Chronic kidney disease, stage 3 unspecified: Secondary | ICD-10-CM | POA: Diagnosis not present

## 2020-03-01 DIAGNOSIS — I739 Peripheral vascular disease, unspecified: Secondary | ICD-10-CM | POA: Diagnosis not present

## 2020-03-01 DIAGNOSIS — I129 Hypertensive chronic kidney disease with stage 1 through stage 4 chronic kidney disease, or unspecified chronic kidney disease: Secondary | ICD-10-CM | POA: Diagnosis not present

## 2020-03-01 DIAGNOSIS — M1990 Unspecified osteoarthritis, unspecified site: Secondary | ICD-10-CM | POA: Diagnosis not present

## 2020-03-02 ENCOUNTER — Encounter: Payer: Self-pay | Admitting: Orthopaedic Surgery

## 2020-03-02 ENCOUNTER — Other Ambulatory Visit: Payer: Self-pay

## 2020-03-02 ENCOUNTER — Ambulatory Visit (INDEPENDENT_AMBULATORY_CARE_PROVIDER_SITE_OTHER): Payer: Medicare Other | Admitting: Orthopaedic Surgery

## 2020-03-02 VITALS — BP 131/81 | HR 68 | Wt 194.0 lb

## 2020-03-02 DIAGNOSIS — M545 Low back pain, unspecified: Secondary | ICD-10-CM

## 2020-03-02 DIAGNOSIS — M79671 Pain in right foot: Secondary | ICD-10-CM | POA: Diagnosis not present

## 2020-03-02 DIAGNOSIS — G8929 Other chronic pain: Secondary | ICD-10-CM

## 2020-03-02 NOTE — Progress Notes (Signed)
Patient Angela House, female DOB:08/30/1935, 84 y.o. NAT:[id number]  Chief Complaint  Patient presents with  . Back Pain    LBP  . Foot Pain    HPI  Angela House is a 84 y.o. female who has lower back pain and right foot pain.  She is better with the right foot. The lower back has good and bad days.  She has no new trauma.  She has no weakness.  She is doing her exercises at home.    Body mass index is 33.3 kg/m.  ROS  Review of Systems  HENT: Negative for congestion.   Respiratory: Negative for cough and shortness of breath.   Cardiovascular: Negative for chest pain and leg swelling.  Gastrointestinal: Positive for abdominal pain.  Endocrine: Positive for cold intolerance.  Musculoskeletal: Positive for arthralgias, back pain and gait problem.  Allergic/Immunologic: Positive for environmental allergies.  All other systems reviewed and are negative.   All other systems reviewed and are negative.  The following is a summary of the past history medically, past history surgically, known current medicines, social history and family history.  This information is gathered electronically by the computer from prior information and documentation.  I review this each visit and have found including this information at this point in the chart is beneficial and informative.    Past Medical History:  Diagnosis Date  . Arthritis   . Hypertension   . PE (pulmonary thromboembolism) ([place])    bilaterally, 03/2017  . Restless leg syndrome     Past Surgical History:  Procedure Laterality Date  . ABDOMINAL HYSTERECTOMY    . CHOLECYSTECTOMY    . EYE SURGERY    . KNEE SURGERY    . WRIST SURGERY      Family History  Problem Relation Age of Onset  . Arthritis Mother   . Hypertension Mother   . Hypertension Sister   . Pneumonia Brother   . Hypertension Sister   . Kidney disease Sister   . Colon cancer Neg Hx     Social History Social History   Tobacco Use  . Smoking  status: Never Smoker  . Smokeless tobacco: Never Used  Vaping Use  . Vaping Use: Never used  Substance Use Topics  . Alcohol use: No    Alcohol/week: 0.0 standard drinks  . Drug use: No    Allergies  Allergen Reactions  . Ciprofloxacin Hives  . Meclizine Other (See Comments)    dizziness    Current Outpatient Medications  Medication Sig Dispense Refill  . acetaminophen (TYLENOL) 325 MG tablet Take 325 mg by mouth every 6 (six) hours as needed.    Marland Kitchen apixaban (ELIQUIS) 2.5 MG TABS tablet Take 2.5 mg by mouth 2 (two) times daily.    . ASPERCREME W/LIDOCAINE EX Apply 1 application topically daily as needed (FOR HIP/KNEE PAIN).     Marland Kitchen calcium carbonate (OS-CAL - DOSED IN MG OF ELEMENTAL CALCIUM) 1250 (500 Ca) MG tablet Take 2 tablets by mouth 2 (two) times daily.     . Carboxymethylcellulose Sodium (THERATEARS) 0.25 % SOLN Apply 1 drop to eye as needed.     . cetirizine (ZYRTEC) 10 MG tablet Take 10 mg by mouth daily as needed.     . clobetasol (TEMOVATE) 0.05 % external solution Apply 1 application topically as needed. Applied to scalp    . clobetasol cream (TEMOVATE) 4.27 % Apply 1 application topically daily as needed (FOR RASH).     Marland Kitchen clotrimazole-betamethasone (LOTRISONE)  cream as needed.     . cycloSPORINE (RESTASIS) 0.05 % ophthalmic emulsion Place 1 drop into both eyes 2 (two) times daily.    . diazepam (VALIUM) 2 MG tablet Take 2 mg by mouth as needed for anxiety.     . famotidine (PEPCID) 20 MG tablet Take 1 tablet by mouth twice daily 60 tablet 3  . fluticasone (FLONASE) 50 MCG/ACT nasal spray Place 2 sprays into the nose as needed for allergies or rhinitis.     . furosemide (LASIX) 20 MG tablet Take by mouth as needed.     . gabapentin (NEURONTIN) 100 MG capsule Take 100 mg by mouth 3 (three) times daily.     Marland Kitchen lisinopril (ZESTRIL) 10 MG tablet Take 10 mg by mouth daily.    . Multiple Vitamin (MULTIVITAMIN) capsule Take 1 capsule by mouth every morning.     Vladimir Faster  Glycol-Propyl Glycol (SYSTANE) 0.4-0.3 % SOLN Apply 1 drop to eye 4 (four) times daily.    . Potassium 99 MG TABS Take 1 tablet by mouth every other day.     . VENTOLIN HFA 108 (90 Base) MCG/ACT inhaler Inhale 1-2 puffs into the lungs every 6 (six) hours as needed.      No current facility-administered medications for this visit.     Physical Exam  Blood pressure 131/81, pulse 68, weight 194 lb (88 kg).  Constitutional: overall normal hygiene, normal nutrition, well developed, normal grooming, normal body habitus. Assistive device:none  Musculoskeletal: gait and station Limp none, muscle tone and strength are normal, no tremors or atrophy is present.  .  Neurological: coordination overall normal.  Deep tendon reflex/nerve stretch intact.  Sensation normal.  Cranial nerves II-XII intact.   Skin:   Normal overall no scars, lesions, ulcers or rashes. No psoriasis.  Psychiatric: Alert and oriented x 3.  Recent memory intact, remote memory unclear.  Normal mood and affect. Well groomed.  Good eye contact.  Cardiovascular: overall no swelling, no varicosities, no edema bilaterally, normal temperatures of the legs and arms, no clubbing, cyanosis and good capillary refill.  Lymphatic: palpation is normal.  Spine/Pelvis examination:  Inspection:  Overall, sacoiliac joint benign and hips nontender; without crepitus or defects.   Thoracic spine inspection: Alignment normal without kyphosis present   Lumbar spine inspection:  Alignment  with normal lumbar lordosis, without scoliosis apparent.   Thoracic spine palpation:  without tenderness of spinal processes   Lumbar spine palpation: without tenderness of lumbar area; without tightness of lumbar muscles    Range of Motion:   Lumbar flexion, forward flexion is normal without pain or tenderness    Lumbar extension is full without pain or tenderness   Left lateral bend is normal without pain or tenderness   Right lateral bend is normal  without pain or tenderness   Straight leg raising is normal  Strength & tone: normal   Stability overall normal stability  All other systems reviewed and are negative   The patient has been educated about the nature of the problem(s) and counseled on treatment options.  The patient appeared to understand what I have discussed and is in agreement with it.  Encounter Diagnoses  Name Primary?  . Chronic midline low back pain without sciatica Yes  . Right foot pain     PLAN Call if any problems.  Precautions discussed.  Continue current medications.   Return to clinic 3 months   Electronically Signed Sanjuana Kava, MD 9/1/20219:28 AM

## 2020-03-10 DIAGNOSIS — I739 Peripheral vascular disease, unspecified: Secondary | ICD-10-CM | POA: Diagnosis not present

## 2020-03-10 DIAGNOSIS — M79671 Pain in right foot: Secondary | ICD-10-CM | POA: Diagnosis not present

## 2020-03-10 DIAGNOSIS — L110 Acquired keratosis follicularis: Secondary | ICD-10-CM | POA: Diagnosis not present

## 2020-03-10 DIAGNOSIS — M79674 Pain in right toe(s): Secondary | ICD-10-CM | POA: Diagnosis not present

## 2020-03-10 DIAGNOSIS — M79672 Pain in left foot: Secondary | ICD-10-CM | POA: Diagnosis not present

## 2020-04-07 DIAGNOSIS — S0590XA Unspecified injury of unspecified eye and orbit, initial encounter: Secondary | ICD-10-CM | POA: Diagnosis not present

## 2020-04-07 DIAGNOSIS — Z299 Encounter for prophylactic measures, unspecified: Secondary | ICD-10-CM | POA: Diagnosis not present

## 2020-04-07 DIAGNOSIS — Z789 Other specified health status: Secondary | ICD-10-CM | POA: Diagnosis not present

## 2020-04-07 DIAGNOSIS — I10 Essential (primary) hypertension: Secondary | ICD-10-CM | POA: Diagnosis not present

## 2020-04-26 DIAGNOSIS — I10 Essential (primary) hypertension: Secondary | ICD-10-CM | POA: Diagnosis not present

## 2020-04-26 DIAGNOSIS — Z299 Encounter for prophylactic measures, unspecified: Secondary | ICD-10-CM | POA: Diagnosis not present

## 2020-04-26 DIAGNOSIS — K219 Gastro-esophageal reflux disease without esophagitis: Secondary | ICD-10-CM | POA: Diagnosis not present

## 2020-04-26 DIAGNOSIS — I2699 Other pulmonary embolism without acute cor pulmonale: Secondary | ICD-10-CM | POA: Diagnosis not present

## 2020-04-26 DIAGNOSIS — N183 Chronic kidney disease, stage 3 unspecified: Secondary | ICD-10-CM | POA: Diagnosis not present

## 2020-04-29 DIAGNOSIS — M1990 Unspecified osteoarthritis, unspecified site: Secondary | ICD-10-CM | POA: Diagnosis not present

## 2020-04-29 DIAGNOSIS — N183 Chronic kidney disease, stage 3 unspecified: Secondary | ICD-10-CM | POA: Diagnosis not present

## 2020-04-29 DIAGNOSIS — I739 Peripheral vascular disease, unspecified: Secondary | ICD-10-CM | POA: Diagnosis not present

## 2020-04-29 DIAGNOSIS — I129 Hypertensive chronic kidney disease with stage 1 through stage 4 chronic kidney disease, or unspecified chronic kidney disease: Secondary | ICD-10-CM | POA: Diagnosis not present

## 2020-05-04 DIAGNOSIS — Z7901 Long term (current) use of anticoagulants: Secondary | ICD-10-CM | POA: Diagnosis not present

## 2020-05-04 DIAGNOSIS — I2699 Other pulmonary embolism without acute cor pulmonale: Secondary | ICD-10-CM | POA: Diagnosis not present

## 2020-05-09 DIAGNOSIS — Z7189 Other specified counseling: Secondary | ICD-10-CM | POA: Diagnosis not present

## 2020-05-09 DIAGNOSIS — Z7901 Long term (current) use of anticoagulants: Secondary | ICD-10-CM | POA: Diagnosis not present

## 2020-05-09 DIAGNOSIS — K029 Dental caries, unspecified: Secondary | ICD-10-CM | POA: Diagnosis not present

## 2020-05-09 DIAGNOSIS — I2699 Other pulmonary embolism without acute cor pulmonale: Secondary | ICD-10-CM | POA: Diagnosis not present

## 2020-05-31 DIAGNOSIS — I129 Hypertensive chronic kidney disease with stage 1 through stage 4 chronic kidney disease, or unspecified chronic kidney disease: Secondary | ICD-10-CM | POA: Diagnosis not present

## 2020-05-31 DIAGNOSIS — M1990 Unspecified osteoarthritis, unspecified site: Secondary | ICD-10-CM | POA: Diagnosis not present

## 2020-05-31 DIAGNOSIS — N183 Chronic kidney disease, stage 3 unspecified: Secondary | ICD-10-CM | POA: Diagnosis not present

## 2020-05-31 DIAGNOSIS — I739 Peripheral vascular disease, unspecified: Secondary | ICD-10-CM | POA: Diagnosis not present

## 2020-06-02 DIAGNOSIS — L110 Acquired keratosis follicularis: Secondary | ICD-10-CM | POA: Diagnosis not present

## 2020-06-02 DIAGNOSIS — I739 Peripheral vascular disease, unspecified: Secondary | ICD-10-CM | POA: Diagnosis not present

## 2020-06-02 DIAGNOSIS — M79671 Pain in right foot: Secondary | ICD-10-CM | POA: Diagnosis not present

## 2020-06-02 DIAGNOSIS — M79672 Pain in left foot: Secondary | ICD-10-CM | POA: Diagnosis not present

## 2020-06-02 DIAGNOSIS — M79674 Pain in right toe(s): Secondary | ICD-10-CM | POA: Diagnosis not present

## 2020-06-08 ENCOUNTER — Other Ambulatory Visit: Payer: Self-pay

## 2020-06-08 ENCOUNTER — Encounter: Payer: Self-pay | Admitting: Orthopaedic Surgery

## 2020-06-08 ENCOUNTER — Ambulatory Visit (INDEPENDENT_AMBULATORY_CARE_PROVIDER_SITE_OTHER): Payer: Medicare Other | Admitting: Orthopaedic Surgery

## 2020-06-08 VITALS — BP 140/78 | HR 69 | Ht 64.0 in | Wt 193.4 lb

## 2020-06-08 DIAGNOSIS — Z7901 Long term (current) use of anticoagulants: Secondary | ICD-10-CM

## 2020-06-08 DIAGNOSIS — M545 Low back pain, unspecified: Secondary | ICD-10-CM | POA: Diagnosis not present

## 2020-06-08 DIAGNOSIS — G8929 Other chronic pain: Secondary | ICD-10-CM

## 2020-06-08 DIAGNOSIS — M25551 Pain in right hip: Secondary | ICD-10-CM

## 2020-06-08 NOTE — Progress Notes (Signed)
Patient Angela House, female DOB:1935/08/27, 84 y.o. [id number]  Chief Complaint  Patient presents with  . Back Pain    hurting some at times    HPI  Angela House is a 84 y.o. female who has lower back pain.  She has good and bad days but more good days recently because of warmer weather.  She has no new trauma, no weakness.  She hit her right hip area against a dresser knob about a month ago.  She developed some swelling and a hematoma. She is on anti-coagulation. She has used ice and it is better.  She has full motion of the right hip.   Body mass index is 33.19 kg/m.  ROS  Review of Systems  HENT: Negative for congestion.   Respiratory: Negative for cough and shortness of breath.   Cardiovascular: Negative for chest pain and leg swelling.  Gastrointestinal: Positive for abdominal pain.  Endocrine: Positive for cold intolerance.  Musculoskeletal: Positive for arthralgias, back pain and gait problem.  Allergic/Immunologic: Positive for environmental allergies.  All other systems reviewed and are negative.   All other systems reviewed and are negative.  The following is a summary of the past history medically, past history surgically, known current medicines, social history and family history.  This information is gathered electronically by the computer from prior information and documentation.  I review this each visit and have found including this information at this point in the chart is beneficial and informative.    Past Medical History:  Diagnosis Date  . Arthritis   . Hypertension   . PE (pulmonary thromboembolism) ([place])    bilaterally, 03/2017  . Restless leg syndrome     Past Surgical History:  Procedure Laterality Date  . ABDOMINAL HYSTERECTOMY    . CHOLECYSTECTOMY    . EYE SURGERY    . KNEE SURGERY    . WRIST SURGERY      Family History  Problem Relation Age of Onset  . Arthritis Mother   . Hypertension Mother   . Hypertension Sister   .  Pneumonia Brother   . Hypertension Sister   . Kidney disease Sister   . Colon cancer Neg Hx     Social History Social History   Tobacco Use  . Smoking status: Never Smoker  . Smokeless tobacco: Never Used  Vaping Use  . Vaping Use: Never used  Substance Use Topics  . Alcohol use: No    Alcohol/week: 0.0 standard drinks  . Drug use: No    Allergies  Allergen Reactions  . Ciprofloxacin Hives  . Meclizine Other (See Comments)    dizziness    Current Outpatient Medications  Medication Sig Dispense Refill  . acetaminophen (TYLENOL) 325 MG tablet Take 325 mg by mouth every 6 (six) hours as needed.    Marland Kitchen apixaban (ELIQUIS) 2.5 MG TABS tablet Take 2.5 mg by mouth 2 (two) times daily.    . ASPERCREME W/LIDOCAINE EX Apply 1 application topically daily as needed (FOR HIP/KNEE PAIN).     Marland Kitchen calcium carbonate (OS-CAL - DOSED IN MG OF ELEMENTAL CALCIUM) 1250 (500 Ca) MG tablet Take 2 tablets by mouth 2 (two) times daily.     . Carboxymethylcellulose Sodium (THERATEARS) 0.25 % SOLN Apply 1 drop to eye as needed.     . cetirizine (ZYRTEC) 10 MG tablet Take 10 mg by mouth daily as needed.     . clobetasol (TEMOVATE) 0.05 % external solution Apply 1 application topically as needed. Applied  to scalp    . clobetasol cream (TEMOVATE) 2.54 % Apply 1 application topically daily as needed (FOR RASH).     Marland Kitchen clotrimazole-betamethasone (LOTRISONE) cream as needed.     . cycloSPORINE (RESTASIS) 0.05 % ophthalmic emulsion Place 1 drop into both eyes 2 (two) times daily.    . diazepam (VALIUM) 2 MG tablet Take 2 mg by mouth as needed for anxiety.     . famotidine (PEPCID) 20 MG tablet Take 1 tablet by mouth twice daily 60 tablet 3  . fluticasone (FLONASE) 50 MCG/ACT nasal spray Place 2 sprays into the nose as needed for allergies or rhinitis.     . furosemide (LASIX) 20 MG tablet Take by mouth as needed.     . gabapentin (NEURONTIN) 100 MG capsule Take 100 mg by mouth 3 (three) times daily.     Marland Kitchen  lisinopril (ZESTRIL) 10 MG tablet Take 10 mg by mouth daily.    . Multiple Vitamin (MULTIVITAMIN) capsule Take 1 capsule by mouth every morning.     [name] (SYSTANE) 0.4-0.3 % SOLN Apply 1 drop to eye 4 (four) times daily.    . Potassium 99 MG TABS Take 1 tablet by mouth every other day.     . VENTOLIN HFA 108 (90 Base) MCG/ACT inhaler Inhale 1-2 puffs into the lungs every 6 (six) hours as needed.      No current facility-administered medications for this visit.     Physical Exam  Blood pressure 140/78, pulse 69, height 5\' 4"  (1.626 m), weight 193 lb 6 oz (87.7 kg).  Constitutional: overall normal hygiene, normal nutrition, well developed, normal grooming, normal body habitus. Assistive device:none  Musculoskeletal: gait and station Limp none, muscle tone and strength are normal, no tremors or atrophy is present.  .  Neurological: coordination overall normal.  Deep tendon reflex/nerve stretch intact.  Sensation normal.  Cranial nerves II-XII intact.   Skin:   Normal overall no scars, lesions, ulcers or rashes. No psoriasis.  Psychiatric: Alert and oriented x 3.  Recent memory intact, remote memory unclear.  Normal mood and affect. Well groomed.  Good eye contact.  Cardiovascular: overall no swelling, no varicosities, no edema bilaterally, normal temperatures of the legs and arms, no clubbing, cyanosis and good capillary refill.  Spine/Pelvis examination:  Inspection:  Overall, sacoiliac joint benign and hips nontender; without crepitus or defects.   Thoracic spine inspection: Alignment normal without kyphosis present   Lumbar spine inspection:  Alignment  with normal lumbar lordosis, without scoliosis apparent.   Thoracic spine palpation:  without tenderness of spinal processes   Lumbar spine palpation: without tenderness of lumbar area; without tightness of lumbar muscles    Range of Motion:   Lumbar flexion, forward flexion is normal without pain or  tenderness    Lumbar extension is full without pain or tenderness   Left lateral bend is normal without pain or tenderness   Right lateral bend is normal without pain or tenderness   Straight leg raising is normal  Strength & tone: normal   Stability overall normal stability The right lateral upper thigh has a small resolving hematoma with no redness.  It is tender, size of about two small grapes.  ROM of the right hip is full. Lymphatic: palpation is normal.  All other systems reviewed and are negative   The patient has been educated about the nature of the problem(s) and counseled on treatment options.  The patient appeared to understand what I have  discussed and is in agreement with it.  Encounter Diagnoses  Name Primary?  . Chronic midline low back pain without sciatica Yes  . Pain in right hip   . Adequate anticoagulation on anticoagulant therapy     PLAN Call if any problems.  Precautions discussed.  Continue current medications.   Return to clinic 3 months   Electronically Signed Sanjuana Kava, MD 12/8/202110:24 AM

## 2020-06-30 ENCOUNTER — Encounter: Payer: Self-pay | Admitting: Internal Medicine

## 2020-07-01 DIAGNOSIS — I129 Hypertensive chronic kidney disease with stage 1 through stage 4 chronic kidney disease, or unspecified chronic kidney disease: Secondary | ICD-10-CM | POA: Diagnosis not present

## 2020-07-01 DIAGNOSIS — M1990 Unspecified osteoarthritis, unspecified site: Secondary | ICD-10-CM | POA: Diagnosis not present

## 2020-07-01 DIAGNOSIS — N183 Chronic kidney disease, stage 3 unspecified: Secondary | ICD-10-CM | POA: Diagnosis not present

## 2020-07-01 DIAGNOSIS — I739 Peripheral vascular disease, unspecified: Secondary | ICD-10-CM | POA: Diagnosis not present

## 2020-07-06 ENCOUNTER — Ambulatory Visit (INDEPENDENT_AMBULATORY_CARE_PROVIDER_SITE_OTHER): Payer: Medicare Other | Admitting: Orthopaedic Surgery

## 2020-07-06 ENCOUNTER — Ambulatory Visit: Payer: Self-pay

## 2020-07-06 ENCOUNTER — Other Ambulatory Visit: Payer: Self-pay

## 2020-07-06 ENCOUNTER — Encounter: Payer: Self-pay | Admitting: Orthopaedic Surgery

## 2020-07-06 VITALS — BP 141/79 | HR 70 | Ht 64.0 in | Wt 190.0 lb

## 2020-07-06 DIAGNOSIS — M79671 Pain in right foot: Secondary | ICD-10-CM

## 2020-07-06 NOTE — Progress Notes (Signed)
Patient [id number] Angela House, female DOB:1935/10/24, 85 y.o. [name]  Chief Complaint  Patient presents with  . Foot Pain    R/hurts on outside of foot. It has been hurting for about a month now    HPI  Angela House is a 85 y.o. female who has developed pain of the lateral side of the right foot more at the distal fifth metatarsal area.  She denies any trauma or redness or numbness or swelling.  It is painful to walk.  She has tried Aspercreme and it is the only thing that helps her pain. She has tried heat and ice.  She is tired of the foot hurting.   Body mass index is 32.61 kg/m.  ROS  Review of Systems  HENT: Negative for congestion.   Respiratory: Negative for cough and shortness of breath.   Cardiovascular: Negative for chest pain and leg swelling.  Gastrointestinal: Positive for abdominal pain.  Endocrine: Positive for cold intolerance.  Musculoskeletal: Positive for arthralgias, back pain and gait problem.  Allergic/Immunologic: Positive for environmental allergies.  All other systems reviewed and are negative.   All other systems reviewed and are negative.  The following is a summary of the past history medically, past history surgically, known current medicines, social history and family history.  This information is gathered electronically by the computer from prior information and documentation.  I review this each visit and have found including this information at this point in the chart is beneficial and informative.    Past Medical History:  Diagnosis Date  . Arthritis   . Hypertension   . PE (pulmonary thromboembolism) ([place])    bilaterally, 03/2017  . Restless leg syndrome     Past Surgical History:  Procedure Laterality Date  . ABDOMINAL HYSTERECTOMY    . CHOLECYSTECTOMY    . EYE SURGERY    . KNEE SURGERY    . WRIST SURGERY      Family History  Problem Relation Age of Onset  . Arthritis Mother   . Hypertension Mother   . Hypertension Sister    . Pneumonia Brother   . Hypertension Sister   . Kidney disease Sister   . Colon cancer Neg Hx     Social History Social History   Tobacco Use  . Smoking status: Never Smoker  . Smokeless tobacco: Never Used  Vaping Use  . Vaping Use: Never used  Substance Use Topics  . Alcohol use: No    Alcohol/week: 0.0 standard drinks  . Drug use: No    Allergies  Allergen Reactions  . Ciprofloxacin Hives  . Meclizine Other (See Comments)    dizziness    Current Outpatient Medications  Medication Sig Dispense Refill  . acetaminophen (TYLENOL) 325 MG tablet Take 325 mg by mouth every 6 (six) hours as needed.    Marland Kitchen apixaban (ELIQUIS) 2.5 MG TABS tablet Take 2.5 mg by mouth 2 (two) times daily.    . ASPERCREME W/LIDOCAINE EX Apply 1 application topically daily as needed (FOR HIP/KNEE PAIN).     Marland Kitchen calcium carbonate (OS-CAL - DOSED IN MG OF ELEMENTAL CALCIUM) 1250 (500 Ca) MG tablet Take 2 tablets by mouth 2 (two) times daily.     . Carboxymethylcellulose Sodium 0.25 % SOLN Apply 1 drop to eye as needed.     . cetirizine (ZYRTEC) 10 MG tablet Take 10 mg by mouth daily as needed.     . clobetasol (TEMOVATE) 0.05 % external solution Apply 1 application topically as needed.  Applied to scalp    . clobetasol cream (TEMOVATE) 0.05 % Apply 1 application topically daily as needed (FOR RASH).     Marland Kitchen clotrimazole-betamethasone (LOTRISONE) cream as needed.     . cycloSPORINE (RESTASIS) 0.05 % ophthalmic emulsion Place 1 drop into both eyes 2 (two) times daily.    . diazepam (VALIUM) 2 MG tablet Take 2 mg by mouth as needed for anxiety.     . famotidine (PEPCID) 20 MG tablet Take 1 tablet by mouth twice daily 60 tablet 3  . fluticasone (FLONASE) 50 MCG/ACT nasal spray Place 2 sprays into the nose as needed for allergies or rhinitis.    . furosemide (LASIX) 20 MG tablet Take by mouth as needed.     . gabapentin (NEURONTIN) 100 MG capsule Take 100 mg by mouth 3 (three) times daily.     Marland Kitchen lisinopril  (ZESTRIL) 10 MG tablet Take 10 mg by mouth daily.    . Multiple Vitamin (MULTIVITAMIN) capsule Take 1 capsule by mouth every morning.     Bertram Gala Glycol-Propyl Glycol (SYSTANE) 0.4-0.3 % SOLN Apply 1 drop to eye 4 (four) times daily.    . Potassium 99 MG TABS Take 1 tablet by mouth every other day.     . VENTOLIN HFA 108 (90 Base) MCG/ACT inhaler Inhale 1-2 puffs into the lungs every 6 (six) hours as needed.      No current facility-administered medications for this visit.     Physical Exam  Blood pressure (!) 141/79, pulse 70, height 5\' 4"  (1.626 m), weight 190 lb (86.2 kg).  Constitutional: overall normal hygiene, normal nutrition, well developed, normal grooming, normal body habitus. Assistive device:none  Musculoskeletal: gait and station Limp right, muscle tone and strength are normal, no tremors or atrophy is present.  .  Neurological: coordination overall normal.  Deep tendon reflex/nerve stretch intact.  Sensation normal.  Cranial nerves II-XII intact.   Skin:   Normal overall no scars, lesions, ulcers or rashes. No psoriasis.  Psychiatric: Alert and oriented x 3.  Recent memory intact, remote memory unclear.  Normal mood and affect. Well groomed.  Good eye contact.  Cardiovascular: overall no swelling, no varicosities, no edema bilaterally, normal temperatures of the legs and arms, no clubbing, cyanosis and good capillary refill.  Lymphatic: palpation is normal.  The lateral right foot is tender and very tender over the plantar fifth metatarsal head area.  There is no swelling, no redness.  NV intact.  Limp to the right.  All other systems reviewed and are negative   The patient has been educated about the nature of the problem(s) and counseled on treatment options.  The patient appeared to understand what I have discussed and is in agreement with it.  Encounter Diagnosis  Name Primary?  . Right foot pain Yes   X-rays were taken of the right foot, reported  separately.  Negative.  PLAN Call if any problems.  Precautions discussed.  Continue current medications.   Return to clinic 2 weeks   Continue the Aspercreme as needed.  I have given a CAM walker to use.  She may need a scan if not improved.  Electronically Signed , MD 1/5/202211:01 AM

## 2020-07-20 ENCOUNTER — Ambulatory Visit (INDEPENDENT_AMBULATORY_CARE_PROVIDER_SITE_OTHER): Payer: Medicare Other | Admitting: Orthopaedic Surgery

## 2020-07-20 ENCOUNTER — Encounter: Payer: Self-pay | Admitting: Orthopaedic Surgery

## 2020-07-20 ENCOUNTER — Other Ambulatory Visit: Payer: Self-pay

## 2020-07-20 VITALS — BP 144/86 | HR 72 | Ht 64.0 in | Wt 190.0 lb

## 2020-07-20 DIAGNOSIS — M79671 Pain in right foot: Secondary | ICD-10-CM

## 2020-07-20 NOTE — Progress Notes (Signed)
Patient Angela House, female DOB:1936-05-11, 85 y.o. DGL:[id number]  Chief Complaint  Patient presents with  . Foot Pain    R/ still bothering me some when I put pressure on it.    HPI  Angela House is a 85 y.o. female who has continued pain of the right foot distally over the fifth metatarsal.  She has slightly less pain at night but has pain walking on it.  I will get MRI to rule out occult fracture.   There is no height or weight on file to calculate BMI.  ROS  Review of Systems  HENT: Negative for congestion.   Respiratory: Negative for cough and shortness of breath.   Cardiovascular: Negative for chest pain and leg swelling.  Gastrointestinal: Positive for abdominal pain.  Endocrine: Positive for cold intolerance.  Musculoskeletal: Positive for arthralgias, back pain and gait problem.  Allergic/Immunologic: Positive for environmental allergies.  All other systems reviewed and are negative.   All other systems reviewed and are negative.  The following is a summary of the past history medically, past history surgically, known current medicines, social history and family history.  This information is gathered electronically by the computer from prior information and documentation.  I review this each visit and have found including this information at this point in the chart is beneficial and informative.    Past Medical History:  Diagnosis Date  . Arthritis   . Hypertension   . PE (pulmonary thromboembolism) ([place])    bilaterally, 03/2017  . Restless leg syndrome     Past Surgical History:  Procedure Laterality Date  . ABDOMINAL HYSTERECTOMY    . CHOLECYSTECTOMY    . EYE SURGERY    . KNEE SURGERY    . WRIST SURGERY      Family History  Problem Relation Age of Onset  . Arthritis Mother   . Hypertension Mother   . Hypertension Sister   . Pneumonia Brother   . Hypertension Sister   . Kidney disease Sister   . Colon cancer Neg Hx     Social  History Social History   Tobacco Use  . Smoking status: Never Smoker  . Smokeless tobacco: Never Used  Vaping Use  . Vaping Use: Never used  Substance Use Topics  . Alcohol use: No    Alcohol/week: 0.0 standard drinks  . Drug use: No    Allergies  Allergen Reactions  . Ciprofloxacin Hives  . Meclizine Other (See Comments)    dizziness    Current Outpatient Medications  Medication Sig Dispense Refill  . acetaminophen (TYLENOL) 325 MG tablet Take 325 mg by mouth every 6 (six) hours as needed.    Marland Kitchen apixaban (ELIQUIS) 2.5 MG TABS tablet Take 2.5 mg by mouth 2 (two) times daily.    . ASPERCREME W/LIDOCAINE EX Apply 1 application topically daily as needed (FOR HIP/KNEE PAIN).     Marland Kitchen calcium carbonate (OS-CAL - DOSED IN MG OF ELEMENTAL CALCIUM) 1250 (500 Ca) MG tablet Take 2 tablets by mouth 2 (two) times daily.     . Carboxymethylcellulose Sodium 0.25 % SOLN Apply 1 drop to eye as needed.     . cetirizine (ZYRTEC) 10 MG tablet Take 10 mg by mouth daily as needed.     . clobetasol (TEMOVATE) 0.05 % external solution Apply 1 application topically as needed. Applied to scalp    . clobetasol cream (TEMOVATE) 5.18 % Apply 1 application topically daily as needed (FOR RASH).     Marland Kitchen  clotrimazole-betamethasone (LOTRISONE) cream as needed.     . cycloSPORINE (RESTASIS) 0.05 % ophthalmic emulsion Place 1 drop into both eyes 2 (two) times daily.    . diazepam (VALIUM) 2 MG tablet Take 2 mg by mouth as needed for anxiety.     . famotidine (PEPCID) 20 MG tablet Take 1 tablet by mouth twice daily 60 tablet 3  . fluticasone (FLONASE) 50 MCG/ACT nasal spray Place 2 sprays into the nose as needed for allergies or rhinitis.    . furosemide (LASIX) 20 MG tablet Take by mouth as needed.     . gabapentin (NEURONTIN) 100 MG capsule Take 100 mg by mouth 3 (three) times daily.     Marland Kitchen lisinopril (ZESTRIL) 10 MG tablet Take 10 mg by mouth daily.    . Multiple Vitamin (MULTIVITAMIN) capsule Take 1 capsule by mouth  every morning.     [name] (SYSTANE) 0.4-0.3 % SOLN Apply 1 drop to eye 4 (four) times daily.    . Potassium 99 MG TABS Take 1 tablet by mouth every other day.     . VENTOLIN HFA 108 (90 Base) MCG/ACT inhaler Inhale 1-2 puffs into the lungs every 6 (six) hours as needed.      No current facility-administered medications for this visit.     Physical Exam  There were no vitals taken for this visit.  Constitutional: overall normal hygiene, normal nutrition, well developed, normal grooming, normal body habitus. Assistive device:none  Musculoskeletal: gait and station Limp right, muscle tone and strength are normal, no tremors or atrophy is present.  .  Neurological: coordination overall normal.  Deep tendon reflex/nerve stretch intact.  Sensation normal.  Cranial nerves II-XII intact.   Skin:   Normal overall no scars, lesions, ulcers or rashes. No psoriasis.  Psychiatric: Alert and oriented x 3.  Recent memory intact, remote memory unclear.  Normal mood and affect. Well groomed.  Good eye contact.  Cardiovascular: overall no swelling, no varicosities, no edema bilaterally, normal temperatures of the legs and arms, no clubbing, cyanosis and good capillary refill.  Lymphatic: palpation is normal.  Right foot has no swelling or redness but she is very tender over the lateral fifth metatarsal distally and plantar area.  She has limp to the right.  NV intact.  All other systems reviewed and are negative   The patient has been educated about the nature of the problem(s) and counseled on treatment options.  The patient appeared to understand what I have discussed and is in agreement with it.  Encounter Diagnosis  Name Primary?  . Right foot pain Yes    PLAN Call if any problems.  Precautions discussed.  Continue current medications.   Return to clinic 2 weeks   Get MRI of the right foot.  Electronically Signed Sanjuana Kava, MD 1/19/20229:26 AM

## 2020-07-27 DIAGNOSIS — Z789 Other specified health status: Secondary | ICD-10-CM | POA: Diagnosis not present

## 2020-07-27 DIAGNOSIS — I739 Peripheral vascular disease, unspecified: Secondary | ICD-10-CM | POA: Diagnosis not present

## 2020-07-27 DIAGNOSIS — I10 Essential (primary) hypertension: Secondary | ICD-10-CM | POA: Diagnosis not present

## 2020-07-27 DIAGNOSIS — Z299 Encounter for prophylactic measures, unspecified: Secondary | ICD-10-CM | POA: Diagnosis not present

## 2020-07-27 DIAGNOSIS — K219 Gastro-esophageal reflux disease without esophagitis: Secondary | ICD-10-CM | POA: Diagnosis not present

## 2020-07-27 DIAGNOSIS — M79673 Pain in unspecified foot: Secondary | ICD-10-CM | POA: Diagnosis not present

## 2020-08-01 DIAGNOSIS — I739 Peripheral vascular disease, unspecified: Secondary | ICD-10-CM | POA: Diagnosis not present

## 2020-08-01 DIAGNOSIS — I129 Hypertensive chronic kidney disease with stage 1 through stage 4 chronic kidney disease, or unspecified chronic kidney disease: Secondary | ICD-10-CM | POA: Diagnosis not present

## 2020-08-01 DIAGNOSIS — N183 Chronic kidney disease, stage 3 unspecified: Secondary | ICD-10-CM | POA: Diagnosis not present

## 2020-08-01 DIAGNOSIS — M1990 Unspecified osteoarthritis, unspecified site: Secondary | ICD-10-CM | POA: Diagnosis not present

## 2020-08-03 DIAGNOSIS — R600 Localized edema: Secondary | ICD-10-CM | POA: Diagnosis not present

## 2020-08-03 DIAGNOSIS — M19071 Primary osteoarthritis, right ankle and foot: Secondary | ICD-10-CM | POA: Diagnosis not present

## 2020-08-03 DIAGNOSIS — M79671 Pain in right foot: Secondary | ICD-10-CM | POA: Diagnosis not present

## 2020-08-13 DIAGNOSIS — S0990XA Unspecified injury of head, initial encounter: Secondary | ICD-10-CM | POA: Diagnosis not present

## 2020-08-13 DIAGNOSIS — I10 Essential (primary) hypertension: Secondary | ICD-10-CM | POA: Diagnosis not present

## 2020-08-13 DIAGNOSIS — W1839XA Other fall on same level, initial encounter: Secondary | ICD-10-CM | POA: Diagnosis not present

## 2020-08-13 DIAGNOSIS — K219 Gastro-esophageal reflux disease without esophagitis: Secondary | ICD-10-CM | POA: Diagnosis not present

## 2020-08-13 DIAGNOSIS — R9082 White matter disease, unspecified: Secondary | ICD-10-CM | POA: Diagnosis not present

## 2020-08-13 DIAGNOSIS — G9389 Other specified disorders of brain: Secondary | ICD-10-CM | POA: Diagnosis not present

## 2020-08-15 DIAGNOSIS — I10 Essential (primary) hypertension: Secondary | ICD-10-CM | POA: Diagnosis not present

## 2020-08-15 DIAGNOSIS — Z789 Other specified health status: Secondary | ICD-10-CM | POA: Diagnosis not present

## 2020-08-15 DIAGNOSIS — Z299 Encounter for prophylactic measures, unspecified: Secondary | ICD-10-CM | POA: Diagnosis not present

## 2020-08-15 DIAGNOSIS — I2699 Other pulmonary embolism without acute cor pulmonale: Secondary | ICD-10-CM | POA: Diagnosis not present

## 2020-08-16 ENCOUNTER — Ambulatory Visit: Payer: Medicare Other | Admitting: Internal Medicine

## 2020-08-17 ENCOUNTER — Other Ambulatory Visit: Payer: Self-pay

## 2020-08-17 ENCOUNTER — Ambulatory Visit (INDEPENDENT_AMBULATORY_CARE_PROVIDER_SITE_OTHER): Payer: Medicare Other | Admitting: Orthopaedic Surgery

## 2020-08-17 ENCOUNTER — Encounter: Payer: Self-pay | Admitting: Orthopaedic Surgery

## 2020-08-17 DIAGNOSIS — M79671 Pain in right foot: Secondary | ICD-10-CM | POA: Diagnosis not present

## 2020-08-17 NOTE — Progress Notes (Signed)
Patient Angela House, female DOB:[birth year], 85 y.o. DUK:[id number]  Chief Complaint  Patient presents with  . Foot Pain    R/its not much better. Hurts 8/9 when walking. I have a hard place on side of toe. I hit my head on a railing Saturday, went to ER they did CT and it was negative. I am feeling pretty good now.    HPI  Angela House is a 85 y.o. female who continues to have lateral right foot pain.  She had MRI which was read a negative.  She still has pain but no swelling, no redness.  She has a CAM walker at home and I have suggested she use that at home.  She tried a small CAM walker here today and said that the one she has will be just fine.  She has no new trauma.  I have told her to continue the rubs to the lateral foot.   There is no height or weight on file to calculate BMI.  ROS  Review of Systems  HENT: Negative for congestion.   Respiratory: Negative for cough and shortness of breath.   Cardiovascular: Negative for chest pain and leg swelling.  Gastrointestinal: Positive for abdominal pain.  Endocrine: Positive for cold intolerance.  Musculoskeletal: Positive for arthralgias, back pain and gait problem.  Allergic/Immunologic: Positive for environmental allergies.  All other systems reviewed and are negative.   All other systems reviewed and are negative.  The following is a summary of the past history medically, past history surgically, known current medicines, social history and family history.  This information is gathered electronically by the computer from prior information and documentation.  I review this each visit and have found including this information at this point in the chart is beneficial and informative.    Past Medical History:  Diagnosis Date  . Arthritis   . Hypertension   . PE (pulmonary thromboembolism) ([place])    bilaterally, 03/2017  . Restless leg syndrome     Past Surgical History:  Procedure Laterality Date  . ABDOMINAL  HYSTERECTOMY    . CHOLECYSTECTOMY    . EYE SURGERY    . KNEE SURGERY    . WRIST SURGERY      Family History  Problem Relation Age of Onset  . Arthritis Mother   . Hypertension Mother   . Hypertension Sister   . Pneumonia Brother   . Hypertension Sister   . Kidney disease Sister   . Colon cancer Neg Hx     Social History Social History   Tobacco Use  . Smoking status: Never Smoker  . Smokeless tobacco: Never Used  Vaping Use  . Vaping Use: Never used  Substance Use Topics  . Alcohol use: No    Alcohol/week: 0.0 standard drinks  . Drug use: No    Allergies  Allergen Reactions  . Ciprofloxacin Hives  . Meclizine Other (See Comments)    dizziness    Current Outpatient Medications  Medication Sig Dispense Refill  . acetaminophen (TYLENOL) 325 MG tablet Take 325 mg by mouth every 6 (six) hours as needed.    Marland Kitchen apixaban (ELIQUIS) 2.5 MG TABS tablet Take 2.5 mg by mouth 2 (two) times daily.    . ASPERCREME W/LIDOCAINE EX Apply 1 application topically daily as needed (FOR HIP/KNEE PAIN).     Marland Kitchen calcium carbonate (OS-CAL - DOSED IN MG OF ELEMENTAL CALCIUM) 1250 (500 Ca) MG tablet Take 2 tablets by mouth 2 (two) times daily.     Marland Kitchen  Carboxymethylcellulose Sodium 0.25 % SOLN Apply 1 drop to eye as needed.     . cetirizine (ZYRTEC) 10 MG tablet Take 10 mg by mouth daily as needed.     . clobetasol (TEMOVATE) 0.05 % external solution Apply 1 application topically as needed. Applied to scalp    . clobetasol cream (TEMOVATE) 8.88 % Apply 1 application topically daily as needed (FOR RASH).     Marland Kitchen clotrimazole-betamethasone (LOTRISONE) cream as needed.     . cycloSPORINE (RESTASIS) 0.05 % ophthalmic emulsion Place 1 drop into both eyes 2 (two) times daily.    . diazepam (VALIUM) 2 MG tablet Take 2 mg by mouth as needed for anxiety.     . famotidine (PEPCID) 20 MG tablet Take 1 tablet by mouth twice daily 60 tablet 3  . fluticasone (FLONASE) 50 MCG/ACT nasal spray Place 2 sprays into the  nose as needed for allergies or rhinitis.    . furosemide (LASIX) 20 MG tablet Take by mouth as needed.     . gabapentin (NEURONTIN) 100 MG capsule Take 100 mg by mouth 3 (three) times daily.     Marland Kitchen lisinopril (ZESTRIL) 10 MG tablet Take 10 mg by mouth daily.    . Multiple Vitamin (MULTIVITAMIN) capsule Take 1 capsule by mouth every morning.     [name] (SYSTANE) 0.4-0.3 % SOLN Apply 1 drop to eye 4 (four) times daily.    . Potassium 99 MG TABS Take 1 tablet by mouth every other day.     . VENTOLIN HFA 108 (90 Base) MCG/ACT inhaler Inhale 1-2 puffs into the lungs every 6 (six) hours as needed.      No current facility-administered medications for this visit.     Physical Exam  There were no vitals taken for this visit.  Constitutional: overall normal hygiene, normal nutrition, well developed, normal grooming, normal body habitus. Assistive device:none  Musculoskeletal: gait and station Limp right, muscle tone and strength are normal, no tremors or atrophy is present.  .  Neurological: coordination overall normal.  Deep tendon reflex/nerve stretch intact.  Sensation normal.  Cranial nerves II-XII intact.   Skin:   Normal overall no scars, lesions, ulcers or rashes. No psoriasis.  Psychiatric: Alert and oriented x 3.  Recent memory intact, remote memory unclear.  Normal mood and affect. Well groomed.  Good eye contact.  Cardiovascular: overall no swelling, no varicosities, no edema bilaterally, normal temperatures of the legs and arms, no clubbing, cyanosis and good capillary refill.  Lymphatic: palpation is normal.  Right lateral foot is tender but has no swelling, no redness. ROM of the ankle is full.  NV intact.  All other systems reviewed and are negative   The patient has been educated about the nature of the problem(s) and counseled on treatment options.  The patient appeared to understand what I have discussed and is in agreement with it.  Encounter  Diagnosis  Name Primary?  . Right foot pain Yes    PLAN Call if any problems.  Precautions discussed.  Continue current medications.   Return to clinic 1 month   Electronically Signed Sanjuana Kava, MD 2/16/202210:53 AM

## 2020-08-29 DIAGNOSIS — I739 Peripheral vascular disease, unspecified: Secondary | ICD-10-CM | POA: Diagnosis not present

## 2020-08-29 DIAGNOSIS — N183 Chronic kidney disease, stage 3 unspecified: Secondary | ICD-10-CM | POA: Diagnosis not present

## 2020-08-29 DIAGNOSIS — I129 Hypertensive chronic kidney disease with stage 1 through stage 4 chronic kidney disease, or unspecified chronic kidney disease: Secondary | ICD-10-CM | POA: Diagnosis not present

## 2020-08-31 ENCOUNTER — Ambulatory Visit: Payer: Medicare Other | Admitting: Orthopaedic Surgery

## 2020-09-01 DIAGNOSIS — M7661 Achilles tendinitis, right leg: Secondary | ICD-10-CM | POA: Diagnosis not present

## 2020-09-01 DIAGNOSIS — M2010 Hallux valgus (acquired), unspecified foot: Secondary | ICD-10-CM | POA: Diagnosis not present

## 2020-09-01 DIAGNOSIS — M25579 Pain in unspecified ankle and joints of unspecified foot: Secondary | ICD-10-CM | POA: Diagnosis not present

## 2020-09-01 DIAGNOSIS — M79671 Pain in right foot: Secondary | ICD-10-CM | POA: Diagnosis not present

## 2020-09-08 DIAGNOSIS — M79674 Pain in right toe(s): Secondary | ICD-10-CM | POA: Diagnosis not present

## 2020-09-08 DIAGNOSIS — M79672 Pain in left foot: Secondary | ICD-10-CM | POA: Diagnosis not present

## 2020-09-08 DIAGNOSIS — L110 Acquired keratosis follicularis: Secondary | ICD-10-CM | POA: Diagnosis not present

## 2020-09-08 DIAGNOSIS — M79675 Pain in left toe(s): Secondary | ICD-10-CM | POA: Diagnosis not present

## 2020-09-08 DIAGNOSIS — I739 Peripheral vascular disease, unspecified: Secondary | ICD-10-CM | POA: Diagnosis not present

## 2020-09-08 DIAGNOSIS — M79671 Pain in right foot: Secondary | ICD-10-CM | POA: Diagnosis not present

## 2020-09-14 ENCOUNTER — Ambulatory Visit (INDEPENDENT_AMBULATORY_CARE_PROVIDER_SITE_OTHER): Payer: Medicare Other | Admitting: Orthopaedic Surgery

## 2020-09-14 ENCOUNTER — Other Ambulatory Visit: Payer: Self-pay

## 2020-09-14 ENCOUNTER — Encounter: Payer: Self-pay | Admitting: Orthopaedic Surgery

## 2020-09-14 VITALS — BP 105/66 | HR 68 | Ht 65.0 in | Wt 190.0 lb

## 2020-09-14 DIAGNOSIS — M545 Low back pain, unspecified: Secondary | ICD-10-CM | POA: Diagnosis not present

## 2020-09-14 DIAGNOSIS — G8929 Other chronic pain: Secondary | ICD-10-CM

## 2020-09-14 DIAGNOSIS — M79671 Pain in right foot: Secondary | ICD-10-CM | POA: Diagnosis not present

## 2020-09-14 NOTE — Progress Notes (Signed)
My right foot is much better.  She has little pain with the right foot.  She is walking well.  She is no longer using the CAM walker.  ROM of the right foot and ankle is full.  She has some lower back pain.  She would like to be seen for this in a few months.  Encounter Diagnoses  Name Primary?  . Right foot pain Yes  . Chronic midline low back pain without sciatica    I will see her in three months for the back.  Call if any problem.  Precautions discussed.   Electronically Signed Sanjuana Kava, MD 3/16/202210:49 AM

## 2020-09-15 DIAGNOSIS — Z1231 Encounter for screening mammogram for malignant neoplasm of breast: Secondary | ICD-10-CM | POA: Diagnosis not present

## 2020-09-22 DIAGNOSIS — M2010 Hallux valgus (acquired), unspecified foot: Secondary | ICD-10-CM | POA: Diagnosis not present

## 2020-09-22 DIAGNOSIS — M79671 Pain in right foot: Secondary | ICD-10-CM | POA: Diagnosis not present

## 2020-09-28 DIAGNOSIS — N183 Chronic kidney disease, stage 3 unspecified: Secondary | ICD-10-CM | POA: Diagnosis not present

## 2020-09-28 DIAGNOSIS — I739 Peripheral vascular disease, unspecified: Secondary | ICD-10-CM | POA: Diagnosis not present

## 2020-09-28 DIAGNOSIS — E1165 Type 2 diabetes mellitus with hyperglycemia: Secondary | ICD-10-CM | POA: Diagnosis not present

## 2020-10-26 ENCOUNTER — Encounter: Payer: Self-pay | Admitting: Orthopaedic Surgery

## 2020-10-26 ENCOUNTER — Other Ambulatory Visit: Payer: Self-pay

## 2020-10-26 ENCOUNTER — Ambulatory Visit (INDEPENDENT_AMBULATORY_CARE_PROVIDER_SITE_OTHER): Payer: Medicare Other | Admitting: Orthopaedic Surgery

## 2020-10-26 DIAGNOSIS — M65312 Trigger thumb, left thumb: Secondary | ICD-10-CM | POA: Diagnosis not present

## 2020-10-26 NOTE — Progress Notes (Signed)
Patient Angela House:HWEXHB E Wheat, female DOB:1935/12/17, 85 y.o. ZJI:[id number]  No chief complaint on file.   HPI  Angela House is a 85 y.o. female who has developed locking and triggering of the left nondominant thumb over the last six weeks.  It is not getting better.  There is no trauma. She has no numbness.   There is no height or weight on file to calculate BMI.  ROS  Review of Systems  HENT: Negative for congestion.   Respiratory: Negative for cough and shortness of breath.   Cardiovascular: Negative for chest pain and leg swelling.  Gastrointestinal: Positive for abdominal pain.  Endocrine: Positive for cold intolerance.  Musculoskeletal: Positive for arthralgias, back pain and gait problem.  Allergic/Immunologic: Positive for environmental allergies.  All other systems reviewed and are negative.   All other systems reviewed and are negative.  The following is a summary of the past history medically, past history surgically, known current medicines, social history and family history.  This information is gathered electronically by the computer from prior information and documentation.  I review this each visit and have found including this information at this point in the chart is beneficial and informative.    Past Medical History:  Diagnosis Date  . Arthritis   . Hypertension   . PE (pulmonary thromboembolism) ([place])    bilaterally, 03/2017  . Restless leg syndrome     Past Surgical History:  Procedure Laterality Date  . ABDOMINAL HYSTERECTOMY    . CHOLECYSTECTOMY    . EYE SURGERY    . KNEE SURGERY    . WRIST SURGERY      Family History  Problem Relation Age of Onset  . Arthritis Mother   . Hypertension Mother   . Hypertension Sister   . Pneumonia Brother   . Hypertension Sister   . Kidney disease Sister   . Colon cancer Neg Hx     Social History Social History   Tobacco Use  . Smoking status: Never Smoker  . Smokeless tobacco: Never Used  Vaping Use   . Vaping Use: Never used  Substance Use Topics  . Alcohol use: No    Alcohol/week: 0.0 standard drinks  . Drug use: No    Allergies  Allergen Reactions  . Ciprofloxacin Hives  . Meclizine Other (See Comments)    dizziness    Current Outpatient Medications  Medication Sig Dispense Refill  . acetaminophen (TYLENOL) 325 MG tablet Take 325 mg by mouth every 6 (six) hours as needed.    Marland Kitchen apixaban (ELIQUIS) 2.5 MG TABS tablet Take 2.5 mg by mouth 2 (two) times daily.    . ASPERCREME W/LIDOCAINE EX Apply 1 application topically daily as needed (FOR HIP/KNEE PAIN).     Marland Kitchen calcium carbonate (OS-CAL - DOSED IN MG OF ELEMENTAL CALCIUM) 1250 (500 Ca) MG tablet Take 2 tablets by mouth 2 (two) times daily.     . Carboxymethylcellulose Sodium 0.25 % SOLN Apply 1 drop to eye as needed.     . cetirizine (ZYRTEC) 10 MG tablet Take 10 mg by mouth daily as needed.     . clobetasol (TEMOVATE) 0.05 % external solution Apply 1 application topically as needed. Applied to scalp    . clobetasol cream (TEMOVATE) 1.75 % Apply 1 application topically daily as needed (FOR RASH).     Marland Kitchen clotrimazole-betamethasone (LOTRISONE) cream as needed.     . cycloSPORINE (RESTASIS) 0.05 % ophthalmic emulsion Place 1 drop into both eyes 2 (two) times daily.    Marland Kitchen  diazepam (VALIUM) 2 MG tablet Take 2 mg by mouth as needed for anxiety.     . famotidine (PEPCID) 20 MG tablet Take 1 tablet by mouth twice daily 60 tablet 3  . fluticasone (FLONASE) 50 MCG/ACT nasal spray Place 2 sprays into the nose as needed for allergies or rhinitis.    . furosemide (LASIX) 20 MG tablet Take by mouth as needed.     . gabapentin (NEURONTIN) 100 MG capsule Take 100 mg by mouth 3 (three) times daily.     Marland Kitchen lisinopril (ZESTRIL) 10 MG tablet Take 10 mg by mouth daily.    . Multiple Vitamin (MULTIVITAMIN) capsule Take 1 capsule by mouth every morning.     [name] (SYSTANE) 0.4-0.3 % SOLN Apply 1 drop to eye 4 (four) times daily.     . Potassium 99 MG TABS Take 1 tablet by mouth every other day.     . VENTOLIN HFA 108 (90 Base) MCG/ACT inhaler Inhale 1-2 puffs into the lungs every 6 (six) hours as needed.      No current facility-administered medications for this visit.     Physical Exam  There were no vitals taken for this visit.  Constitutional: overall normal hygiene, normal nutrition, well developed, normal grooming, normal body habitus. Assistive device:none  Musculoskeletal: gait and station Limp none, muscle tone and strength are normal, no tremors or atrophy is present.  .  Neurological: coordination overall normal.  Deep tendon reflex/nerve stretch intact.  Sensation normal.  Cranial nerves II-XII intact.   Skin:   Normal overall no scars, lesions, ulcers or rashes. No psoriasis.  Psychiatric: Alert and oriented x 3.  Recent memory intact, remote memory unclear.  Normal mood and affect. Well groomed.  Good eye contact.  Cardiovascular: overall no swelling, no varicosities, no edema bilaterally, normal temperatures of the legs and arms, no clubbing, cyanosis and good capillary refill.  Lymphatic: palpation is normal.  Left thumb has locking at the A1 pulley.  NV intact.  There is no redness.  All other systems reviewed and are negative   The patient has been educated about the nature of the problem(s) and counseled on treatment options.  The patient appeared to understand what I have discussed and is in agreement with it.  Encounter Diagnosis  Name Primary?  . Trigger thumb, left thumb Yes   PROCEDURE  Trigger Finger Injection  The left Thumb  has been locking at the A1 pulley.  The patient has been told about injection of the digit.  Surgical correction and excision of the A1 pulley will resolve the problem.  Ani injection in the digit should help but the results may be short lived.  The patient asked appropriate questions and understands the procedure.  The patient has elected for an  injection at this time.  Verbal consent was obtained.  A timeout was taken to confirm the proper hand and digit.  Medication  1 mL of Celestone 6 mg  2 mL of 1% lidocaine plain  Ethyl chloride for anesthesia  Alcohol was used to prepare the skin along with ethyl chloride and then the injection was made at the A1 pulley there were no complications.  It was tolerated well.  A Band-aid dressing was applied.  Call if any problem or difficulty.  PLAN Call if any problems.  Precautions discussed.  Continue current medications.   Return to clinic 1 month   Electronically Signed Sanjuana Kava, MD 4/27/20229:10 AM

## 2020-10-29 DIAGNOSIS — G4710 Hypersomnia, unspecified: Secondary | ICD-10-CM | POA: Diagnosis not present

## 2020-11-02 DIAGNOSIS — Z7901 Long term (current) use of anticoagulants: Secondary | ICD-10-CM | POA: Diagnosis not present

## 2020-11-02 DIAGNOSIS — I158 Other secondary hypertension: Secondary | ICD-10-CM | POA: Diagnosis not present

## 2020-11-02 DIAGNOSIS — G2581 Restless legs syndrome: Secondary | ICD-10-CM | POA: Diagnosis not present

## 2020-11-02 DIAGNOSIS — I2699 Other pulmonary embolism without acute cor pulmonale: Secondary | ICD-10-CM | POA: Diagnosis not present

## 2020-11-09 DIAGNOSIS — Z7901 Long term (current) use of anticoagulants: Secondary | ICD-10-CM | POA: Diagnosis not present

## 2020-11-09 DIAGNOSIS — I2699 Other pulmonary embolism without acute cor pulmonale: Secondary | ICD-10-CM | POA: Diagnosis not present

## 2020-11-09 DIAGNOSIS — K029 Dental caries, unspecified: Secondary | ICD-10-CM | POA: Diagnosis not present

## 2020-11-11 DIAGNOSIS — M79671 Pain in right foot: Secondary | ICD-10-CM | POA: Diagnosis not present

## 2020-11-11 DIAGNOSIS — M2010 Hallux valgus (acquired), unspecified foot: Secondary | ICD-10-CM | POA: Diagnosis not present

## 2020-11-17 DIAGNOSIS — L110 Acquired keratosis follicularis: Secondary | ICD-10-CM | POA: Diagnosis not present

## 2020-11-17 DIAGNOSIS — M79675 Pain in left toe(s): Secondary | ICD-10-CM | POA: Diagnosis not present

## 2020-11-17 DIAGNOSIS — M79674 Pain in right toe(s): Secondary | ICD-10-CM | POA: Diagnosis not present

## 2020-11-17 DIAGNOSIS — I739 Peripheral vascular disease, unspecified: Secondary | ICD-10-CM | POA: Diagnosis not present

## 2020-11-17 DIAGNOSIS — M79672 Pain in left foot: Secondary | ICD-10-CM | POA: Diagnosis not present

## 2020-11-17 DIAGNOSIS — M79671 Pain in right foot: Secondary | ICD-10-CM | POA: Diagnosis not present

## 2020-11-23 ENCOUNTER — Ambulatory Visit (INDEPENDENT_AMBULATORY_CARE_PROVIDER_SITE_OTHER): Payer: Medicare Other | Admitting: Orthopaedic Surgery

## 2020-11-23 ENCOUNTER — Encounter: Payer: Self-pay | Admitting: Orthopaedic Surgery

## 2020-11-23 ENCOUNTER — Other Ambulatory Visit: Payer: Self-pay

## 2020-11-23 VITALS — BP 110/70 | HR 70 | Ht 65.0 in | Wt 186.5 lb

## 2020-11-23 DIAGNOSIS — M65312 Trigger thumb, left thumb: Secondary | ICD-10-CM

## 2020-11-23 NOTE — Progress Notes (Signed)
My thumb does not hurt at all.  She is doing well.  She has no further popping or locking of the left thumb.  ROM is full.  NV intact.  Encounter Diagnosis  Name Primary?  . Trigger thumb, left thumb Yes   I will see her as needed.  Call if any problem.  Precautions discussed.   Electronically Signed Sanjuana Kava, MD 5/25/20229:16 AM

## 2020-11-28 DIAGNOSIS — G4710 Hypersomnia, unspecified: Secondary | ICD-10-CM | POA: Diagnosis not present

## 2020-12-07 ENCOUNTER — Ambulatory Visit: Payer: Medicare Other | Admitting: Orthopaedic Surgery

## 2020-12-26 DIAGNOSIS — H1045 Other chronic allergic conjunctivitis: Secondary | ICD-10-CM | POA: Diagnosis not present

## 2020-12-26 DIAGNOSIS — H16223 Keratoconjunctivitis sicca, not specified as Sjogren's, bilateral: Secondary | ICD-10-CM | POA: Diagnosis not present

## 2020-12-29 DIAGNOSIS — G4710 Hypersomnia, unspecified: Secondary | ICD-10-CM | POA: Diagnosis not present

## 2021-01-16 DIAGNOSIS — R208 Other disturbances of skin sensation: Secondary | ICD-10-CM | POA: Diagnosis not present

## 2021-01-16 DIAGNOSIS — L910 Hypertrophic scar: Secondary | ICD-10-CM | POA: Diagnosis not present

## 2021-01-17 DIAGNOSIS — M2010 Hallux valgus (acquired), unspecified foot: Secondary | ICD-10-CM | POA: Diagnosis not present

## 2021-01-17 DIAGNOSIS — M79671 Pain in right foot: Secondary | ICD-10-CM | POA: Diagnosis not present

## 2021-01-17 DIAGNOSIS — L110 Acquired keratosis follicularis: Secondary | ICD-10-CM | POA: Diagnosis not present

## 2021-01-27 DIAGNOSIS — R5383 Other fatigue: Secondary | ICD-10-CM | POA: Diagnosis not present

## 2021-01-27 DIAGNOSIS — E7800 Pure hypercholesterolemia, unspecified: Secondary | ICD-10-CM | POA: Diagnosis not present

## 2021-01-27 DIAGNOSIS — Z79899 Other long term (current) drug therapy: Secondary | ICD-10-CM | POA: Diagnosis not present

## 2021-01-27 DIAGNOSIS — Z7189 Other specified counseling: Secondary | ICD-10-CM | POA: Diagnosis not present

## 2021-01-27 DIAGNOSIS — E559 Vitamin D deficiency, unspecified: Secondary | ICD-10-CM | POA: Diagnosis not present

## 2021-01-27 DIAGNOSIS — I779 Disorder of arteries and arterioles, unspecified: Secondary | ICD-10-CM | POA: Diagnosis not present

## 2021-01-27 DIAGNOSIS — I700 Atherosclerosis of aorta: Secondary | ICD-10-CM | POA: Diagnosis not present

## 2021-01-27 DIAGNOSIS — I10 Essential (primary) hypertension: Secondary | ICD-10-CM | POA: Diagnosis not present

## 2021-01-27 DIAGNOSIS — Z0000 Encounter for general adult medical examination without abnormal findings: Secondary | ICD-10-CM | POA: Diagnosis not present

## 2021-01-27 DIAGNOSIS — Z299 Encounter for prophylactic measures, unspecified: Secondary | ICD-10-CM | POA: Diagnosis not present

## 2021-01-29 DIAGNOSIS — G4710 Hypersomnia, unspecified: Secondary | ICD-10-CM | POA: Diagnosis not present

## 2021-02-09 DIAGNOSIS — M79671 Pain in right foot: Secondary | ICD-10-CM | POA: Diagnosis not present

## 2021-02-09 DIAGNOSIS — M79674 Pain in right toe(s): Secondary | ICD-10-CM | POA: Diagnosis not present

## 2021-02-09 DIAGNOSIS — L110 Acquired keratosis follicularis: Secondary | ICD-10-CM | POA: Diagnosis not present

## 2021-02-09 DIAGNOSIS — I739 Peripheral vascular disease, unspecified: Secondary | ICD-10-CM | POA: Diagnosis not present

## 2021-02-09 DIAGNOSIS — M79672 Pain in left foot: Secondary | ICD-10-CM | POA: Diagnosis not present

## 2021-02-09 DIAGNOSIS — M79675 Pain in left toe(s): Secondary | ICD-10-CM | POA: Diagnosis not present

## 2021-02-13 DIAGNOSIS — I779 Disorder of arteries and arterioles, unspecified: Secondary | ICD-10-CM | POA: Diagnosis not present

## 2021-02-13 DIAGNOSIS — I6523 Occlusion and stenosis of bilateral carotid arteries: Secondary | ICD-10-CM | POA: Diagnosis not present

## 2021-02-15 DIAGNOSIS — L910 Hypertrophic scar: Secondary | ICD-10-CM | POA: Diagnosis not present

## 2021-02-15 DIAGNOSIS — L818 Other specified disorders of pigmentation: Secondary | ICD-10-CM | POA: Diagnosis not present

## 2021-02-20 DIAGNOSIS — N183 Chronic kidney disease, stage 3 unspecified: Secondary | ICD-10-CM | POA: Diagnosis not present

## 2021-02-20 DIAGNOSIS — Z299 Encounter for prophylactic measures, unspecified: Secondary | ICD-10-CM | POA: Diagnosis not present

## 2021-02-20 DIAGNOSIS — E041 Nontoxic single thyroid nodule: Secondary | ICD-10-CM | POA: Diagnosis not present

## 2021-02-20 DIAGNOSIS — I10 Essential (primary) hypertension: Secondary | ICD-10-CM | POA: Diagnosis not present

## 2021-02-24 DIAGNOSIS — E041 Nontoxic single thyroid nodule: Secondary | ICD-10-CM | POA: Diagnosis not present

## 2021-03-01 DIAGNOSIS — G4710 Hypersomnia, unspecified: Secondary | ICD-10-CM | POA: Diagnosis not present

## 2021-03-08 DIAGNOSIS — Z299 Encounter for prophylactic measures, unspecified: Secondary | ICD-10-CM | POA: Diagnosis not present

## 2021-03-08 DIAGNOSIS — E041 Nontoxic single thyroid nodule: Secondary | ICD-10-CM | POA: Diagnosis not present

## 2021-03-08 DIAGNOSIS — Z713 Dietary counseling and surveillance: Secondary | ICD-10-CM | POA: Diagnosis not present

## 2021-03-08 DIAGNOSIS — I10 Essential (primary) hypertension: Secondary | ICD-10-CM | POA: Diagnosis not present

## 2021-03-15 IMAGING — MR MR LUMBAR SPINE W/O CM
4 of 5 series · 26 of 48 positions shown · non-contrast
Comparison: Radiographs dated 05/04/2015

CLINICAL DATA: Right-sided low back pain for 1 month.

EXAM:
MRI LUMBAR SPINE WITHOUT CONTRAST
TECHNIQUE: Multiplanar, multisequence MR imaging of the lumbar spine was
performed. No intravenous contrast was administered.

[Series 3: T2 · sagittal · 4.0mm · 0.55mm/px · 6 of 15 slices shown (1 of 2)]
[im 1/15]
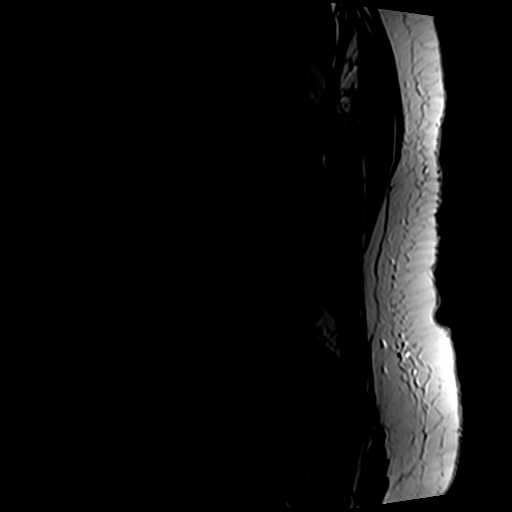
[im 3/15]
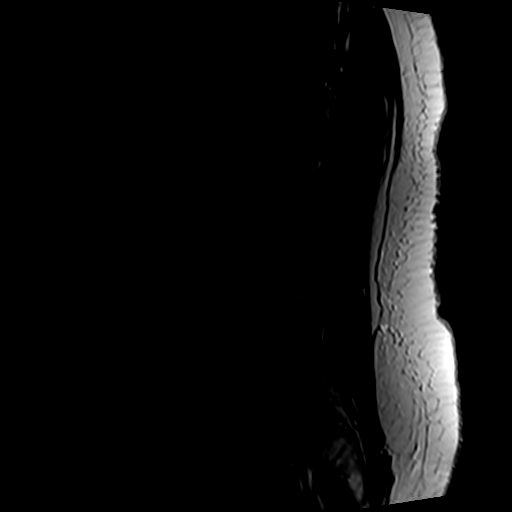
[im 6/15]
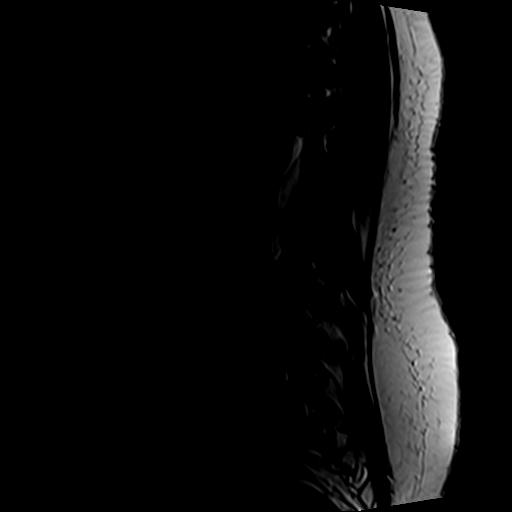
[im 9/15]
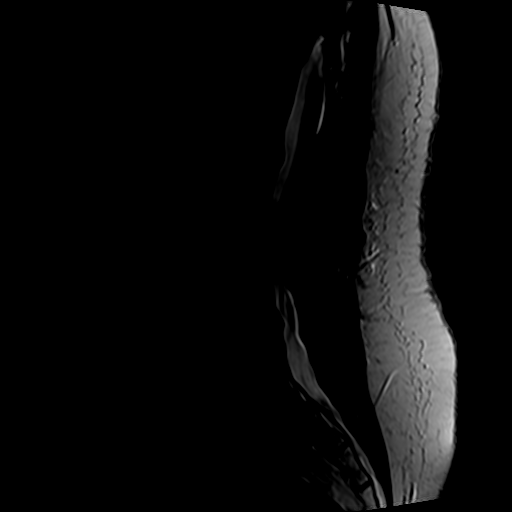
[im 12/15]
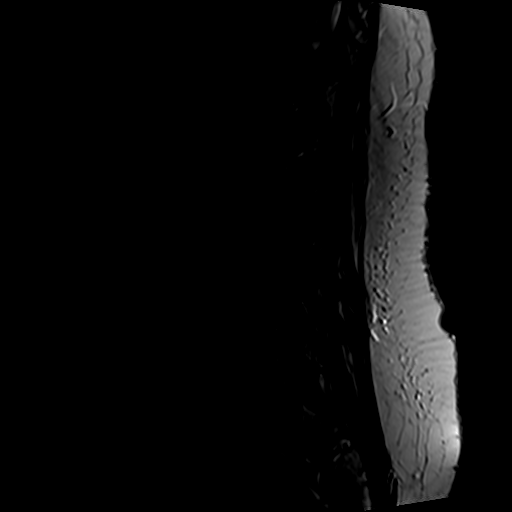
[im 15/15]
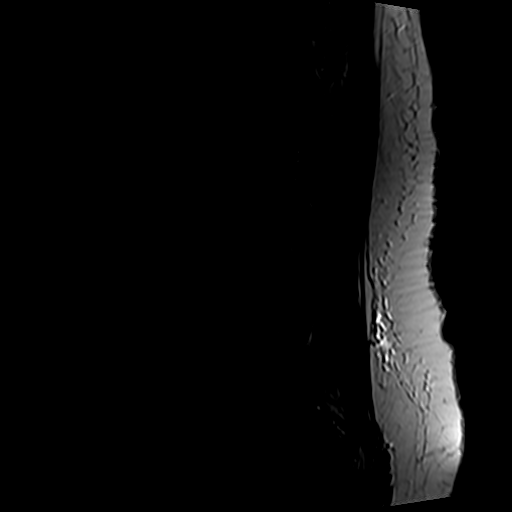

[Series 4: T1 · sagittal · 4.0mm · 0.55mm/px · 6 of 15 slices shown (1 of 2)]
[im 1/15]
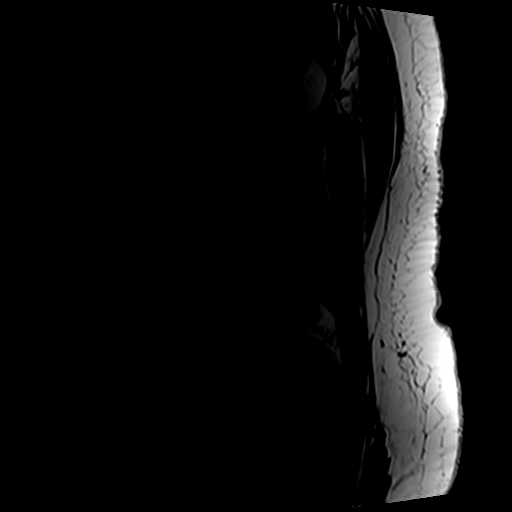
[im 3/15]
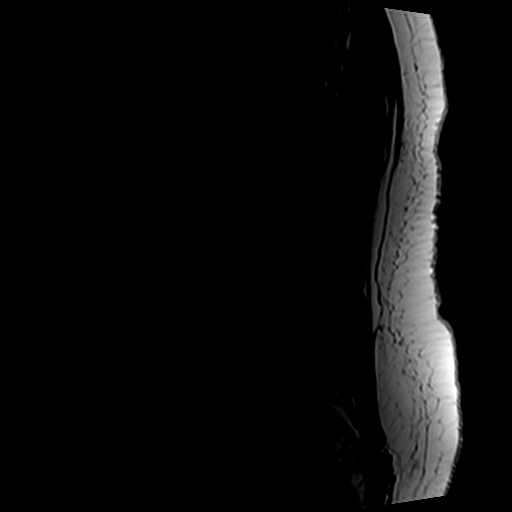
[im 6/15]
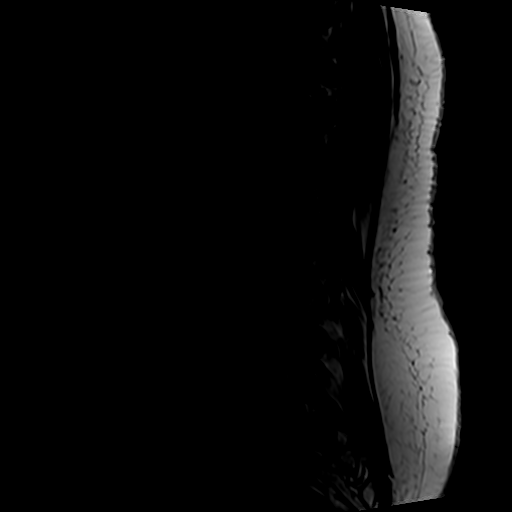
[im 9/15]
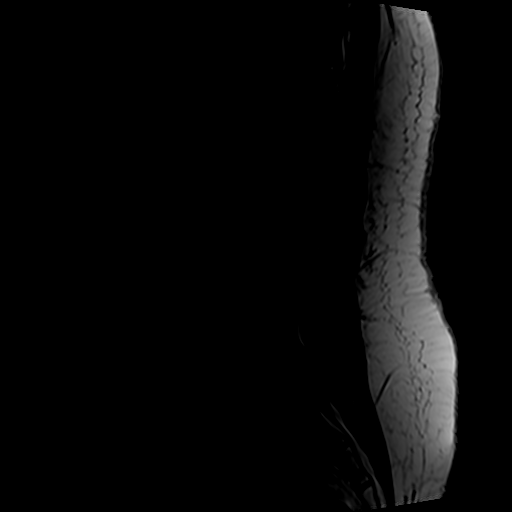
[im 12/15]
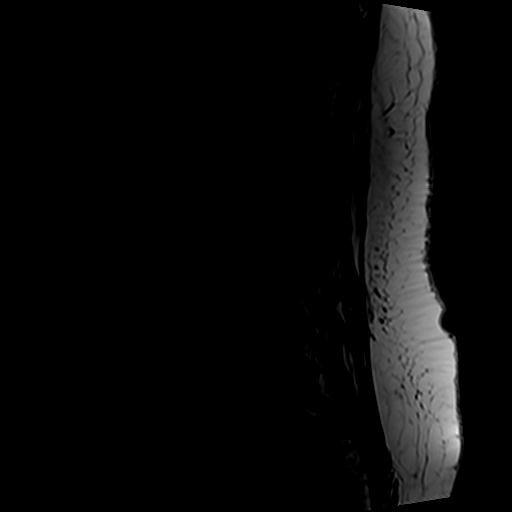
[im 15/15]
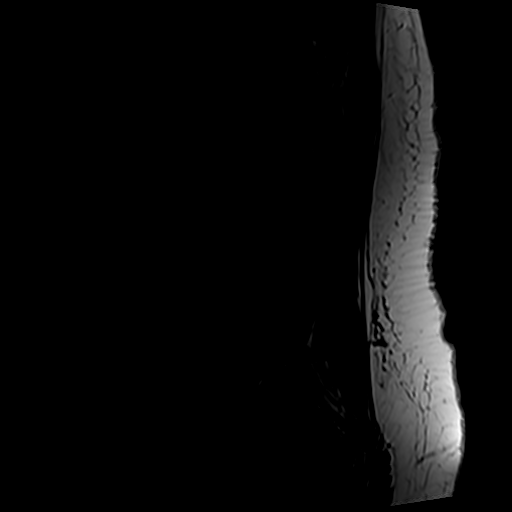

[Series 6: T2 · axial · 4.0mm · 0.70mm/px · z∈[-77,+115]mm · 9 of 37 slices shown (2 of 2)]
[im 1/37]
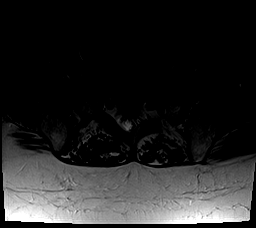
[im 6/37]
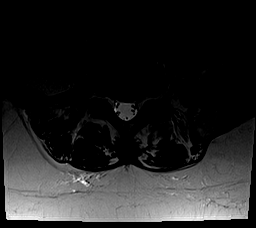
[im 11/37]
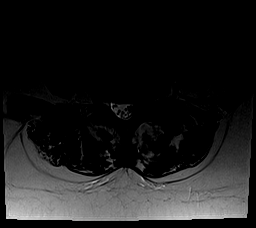
[im 16/37]
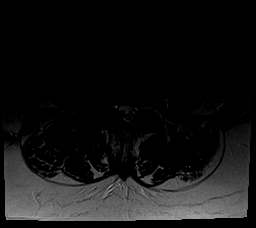
[im 19/37]
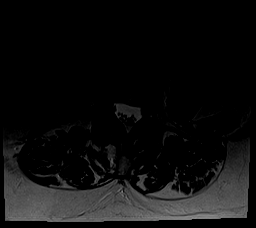
[im 21/37]
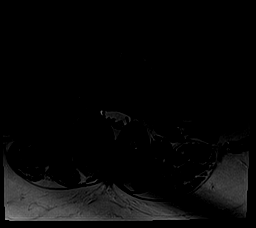
[im 26/37]
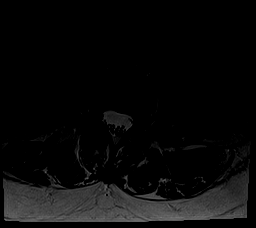
[im 31/37]
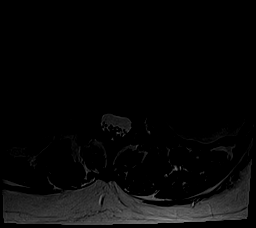
[im 37/37]
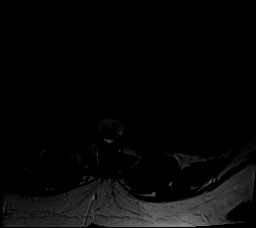

[Series 7: T1 · axial · 4.0mm · 0.35mm/px · z∈[-77,+85]mm · 5 of 37 slices shown (2 of 2)]
[im 1/37]
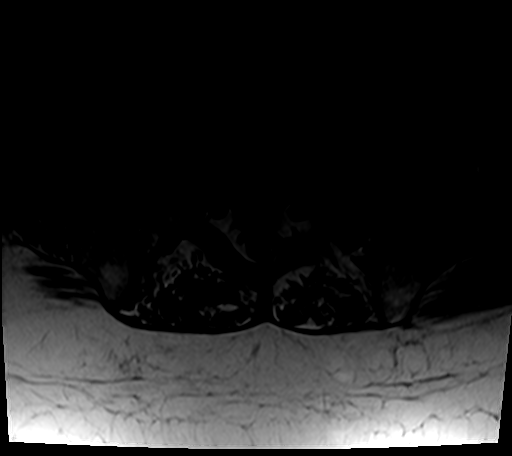
[im 6/37]
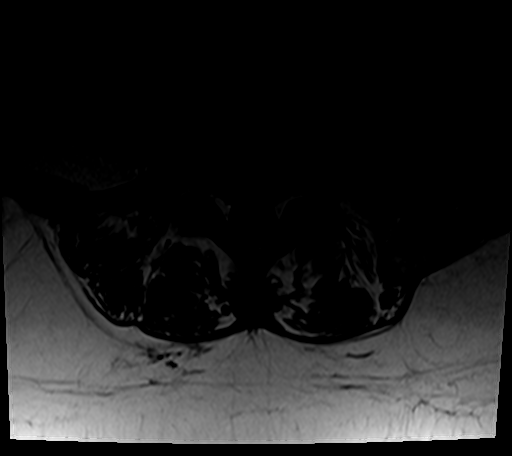
[im 11/37]
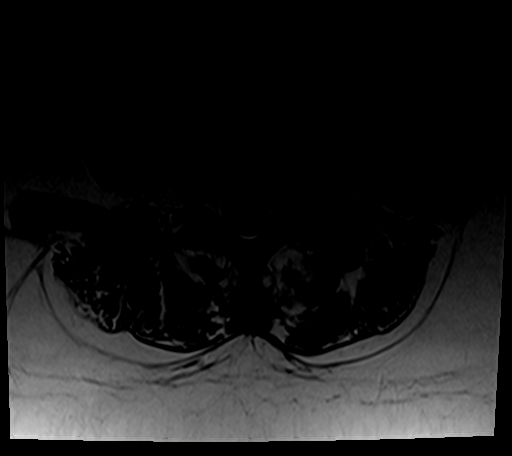
[im 19/37]
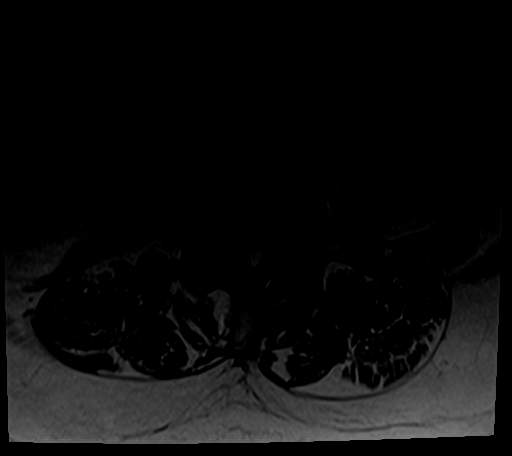
[im 31/37]
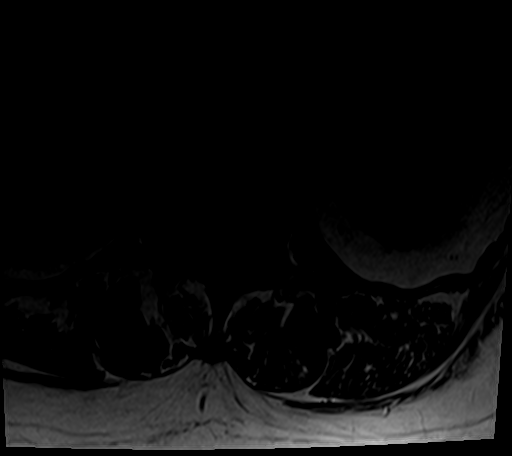

[26 of 48 positions shown; findings below may reference images not displayed]

FINDINGS: Segmentation:  Standard.

Alignment: Slight lumbar scoliosis with convexity to the left
centered at L2-3. 2 mm spondylolisthesis of L3 on L4.

Vertebrae:  No fracture, evidence of discitis, or bone lesion.

Conus medullaris and cauda equina: Conus extends to the L1-2 level.
Conus and cauda equina appear normal.

Paraspinal and other soft tissues: Negative.

Disc levels:

T12-L1: Negative.

L1-2: Negative.

L2-3: Tiny broad-based disc bulge with no neural impingement.

L3-4: 2 mm spondylolisthesis. Disc space narrowing. Small
broad-based bulge of the uncovered disc asymmetric into the right
neural foramen with a slight mass effect upon the right L3 nerve
lateral to the neural foramen seen on image 22 of series 7. Moderate
right facet arthritis with slight hypertrophy of the right
ligamentum flavum. Slight narrowing of the spinal canal.

L4-5: Disc space narrowing with degenerative changes of the
vertebral endplates to the left of midline. Small broad-based disc
protrusion asymmetric to the left with slight compression of the
left lateral recess which could affect the left L5 nerve. Moderate
left foraminal stenosis. However, the L4 nerve appears to exit
without impingement. Moderately severe left facet arthritis.

L5-S1: Normal disc. No facet arthritis.
IMPRESSION: 1. Moderately severe left facet arthritis at L4-5 with a small
broad-based disc protrusion asymmetric to the left which could
affect the left L5 nerve.
2. Moderate right facet arthritis at L3-4 with a slight mass effect
upon the right L3 nerve lateral to the neural foramen.

## 2021-03-23 ENCOUNTER — Other Ambulatory Visit: Payer: Self-pay

## 2021-03-23 ENCOUNTER — Ambulatory Visit (INDEPENDENT_AMBULATORY_CARE_PROVIDER_SITE_OTHER): Payer: Medicare Other | Admitting: Orthopaedic Surgery

## 2021-03-23 ENCOUNTER — Encounter: Payer: Self-pay | Admitting: Orthopaedic Surgery

## 2021-03-23 VITALS — Ht 65.0 in | Wt 184.8 lb

## 2021-03-23 DIAGNOSIS — M65312 Trigger thumb, left thumb: Secondary | ICD-10-CM | POA: Diagnosis not present

## 2021-03-23 NOTE — Progress Notes (Signed)
PROCEDURE  Trigger Finger Injection  The left Thumb  has been locking at the A1 pulley.  The patient has been told about injection of the digit.  Surgical correction and excision of the A1 pulley will resolve the problem.  Ani injection in the digit should help but the results may be short lived.  The patient asked appropriate questions and understands the procedure.  The patient has elected for an injection at this time.  Verbal consent was obtained.  A timeout was taken to confirm the proper hand and digit.  Medication  1 mL of Celestone 6 mg  2 mL of 1% lidocaine plain  Ethyl chloride for anesthesia  Alcohol was used to prepare the skin along with ethyl chloride and then the injection was made at the A1 pulley there were no complications.  It was tolerated well.  A Band-aid dressing was applied.  Call if any problem or difficulty.   If this recurs, she needs to seriously consider surgical release of A1 pulley.  Call if any problem.  Precautions discussed.  Electronically Signed Sanjuana Kava, MD 9/22/20229:58 AM

## 2021-03-31 DIAGNOSIS — G4710 Hypersomnia, unspecified: Secondary | ICD-10-CM | POA: Diagnosis not present

## 2021-04-27 ENCOUNTER — Other Ambulatory Visit (HOSPITAL_COMMUNITY): Payer: Self-pay | Admitting: Otolaryngology

## 2021-04-27 DIAGNOSIS — E041 Nontoxic single thyroid nodule: Secondary | ICD-10-CM

## 2021-04-27 DIAGNOSIS — H6123 Impacted cerumen, bilateral: Secondary | ICD-10-CM | POA: Diagnosis not present

## 2021-04-27 DIAGNOSIS — D440 Neoplasm of uncertain behavior of thyroid gland: Secondary | ICD-10-CM | POA: Diagnosis not present

## 2021-05-01 DIAGNOSIS — G4710 Hypersomnia, unspecified: Secondary | ICD-10-CM | POA: Diagnosis not present

## 2021-05-05 DIAGNOSIS — I2699 Other pulmonary embolism without acute cor pulmonale: Secondary | ICD-10-CM | POA: Diagnosis not present

## 2021-05-05 DIAGNOSIS — I10 Essential (primary) hypertension: Secondary | ICD-10-CM | POA: Diagnosis not present

## 2021-05-05 DIAGNOSIS — K029 Dental caries, unspecified: Secondary | ICD-10-CM | POA: Diagnosis not present

## 2021-05-05 DIAGNOSIS — R799 Abnormal finding of blood chemistry, unspecified: Secondary | ICD-10-CM | POA: Diagnosis not present

## 2021-05-05 DIAGNOSIS — Z299 Encounter for prophylactic measures, unspecified: Secondary | ICD-10-CM | POA: Diagnosis not present

## 2021-05-05 DIAGNOSIS — E041 Nontoxic single thyroid nodule: Secondary | ICD-10-CM | POA: Diagnosis not present

## 2021-05-05 DIAGNOSIS — Z7901 Long term (current) use of anticoagulants: Secondary | ICD-10-CM | POA: Diagnosis not present

## 2021-05-05 DIAGNOSIS — H8309 Labyrinthitis, unspecified ear: Secondary | ICD-10-CM | POA: Diagnosis not present

## 2021-05-11 DIAGNOSIS — M79672 Pain in left foot: Secondary | ICD-10-CM | POA: Diagnosis not present

## 2021-05-11 DIAGNOSIS — I739 Peripheral vascular disease, unspecified: Secondary | ICD-10-CM | POA: Diagnosis not present

## 2021-05-11 DIAGNOSIS — L110 Acquired keratosis follicularis: Secondary | ICD-10-CM | POA: Diagnosis not present

## 2021-05-11 DIAGNOSIS — M79674 Pain in right toe(s): Secondary | ICD-10-CM | POA: Diagnosis not present

## 2021-05-11 DIAGNOSIS — M79675 Pain in left toe(s): Secondary | ICD-10-CM | POA: Diagnosis not present

## 2021-05-11 DIAGNOSIS — M79671 Pain in right foot: Secondary | ICD-10-CM | POA: Diagnosis not present

## 2021-05-12 DIAGNOSIS — Z86711 Personal history of pulmonary embolism: Secondary | ICD-10-CM | POA: Diagnosis not present

## 2021-05-12 DIAGNOSIS — Z7901 Long term (current) use of anticoagulants: Secondary | ICD-10-CM | POA: Diagnosis not present

## 2021-05-12 DIAGNOSIS — R799 Abnormal finding of blood chemistry, unspecified: Secondary | ICD-10-CM | POA: Diagnosis not present

## 2021-05-17 ENCOUNTER — Other Ambulatory Visit: Payer: Self-pay

## 2021-05-17 ENCOUNTER — Encounter (HOSPITAL_COMMUNITY): Payer: Self-pay

## 2021-05-17 ENCOUNTER — Ambulatory Visit (HOSPITAL_COMMUNITY)
Admission: RE | Admit: 2021-05-17 | Discharge: 2021-05-17 | Disposition: A | Discharge status: Home / Self Care | Payer: Medicare Other | Source: Ambulatory Visit | Attending: Otolaryngology | Admitting: Otolaryngology

## 2021-05-17 DIAGNOSIS — E041 Nontoxic single thyroid nodule: Secondary | ICD-10-CM | POA: Insufficient documentation

## 2021-05-17 MED ORDER — LIDOCAINE HCL (PF) 2 % IJ SOLN: 10.0000 mL | Freq: Once | INTRAMUSCULAR | Status: AC

## 2021-05-17 MED ORDER — LIDOCAINE HCL (PF) 2 % IJ SOLN
INTRAMUSCULAR | Status: AC
  Administered 2021-05-17: 10 mL
  Filled 2021-05-17: qty 10

## 2021-05-17 NOTE — Progress Notes (Signed)
PT tolerated thyroid biopsy procedure well today. Labs and afirma obtained and sent for pathology at 1042. PT ambulatory at discharge with no acute distress noted and verbalized understanding of discharge instructions.

## 2021-05-18 LAB — CYTOLOGY - NON PAP

## 2021-05-23 DIAGNOSIS — S60221A Contusion of right hand, initial encounter: Secondary | ICD-10-CM | POA: Diagnosis not present

## 2021-05-23 DIAGNOSIS — W2209XA Striking against other stationary object, initial encounter: Secondary | ICD-10-CM | POA: Diagnosis not present

## 2021-05-23 DIAGNOSIS — M79641 Pain in right hand: Secondary | ICD-10-CM | POA: Diagnosis not present

## 2021-05-24 ENCOUNTER — Emergency Department (HOSPITAL_COMMUNITY)
Admission: EM | Admit: 2021-05-24 | Discharge: 2021-05-24 | Disposition: A | Discharge status: Home / Self Care | Payer: Medicare Other | Attending: Emergency Medicine | Admitting: Emergency Medicine

## 2021-05-24 ENCOUNTER — Emergency Department (HOSPITAL_COMMUNITY): Payer: Medicare Other

## 2021-05-24 ENCOUNTER — Other Ambulatory Visit: Payer: Self-pay

## 2021-05-24 DIAGNOSIS — I10 Essential (primary) hypertension: Secondary | ICD-10-CM | POA: Diagnosis not present

## 2021-05-24 DIAGNOSIS — Y92000 Kitchen of unspecified non-institutional (private) residence as  the place of occurrence of the external cause: Secondary | ICD-10-CM | POA: Diagnosis not present

## 2021-05-24 DIAGNOSIS — M7989 Other specified soft tissue disorders: Secondary | ICD-10-CM | POA: Diagnosis not present

## 2021-05-24 DIAGNOSIS — M25531 Pain in right wrist: Secondary | ICD-10-CM

## 2021-05-24 DIAGNOSIS — W230XXA Caught, crushed, jammed, or pinched between moving objects, initial encounter: Secondary | ICD-10-CM | POA: Insufficient documentation

## 2021-05-24 DIAGNOSIS — Z79899 Other long term (current) drug therapy: Secondary | ICD-10-CM | POA: Insufficient documentation

## 2021-05-24 DIAGNOSIS — S6991XA Unspecified injury of right wrist, hand and finger(s), initial encounter: Secondary | ICD-10-CM | POA: Diagnosis not present

## 2021-05-24 DIAGNOSIS — Z7951 Long term (current) use of inhaled steroids: Secondary | ICD-10-CM | POA: Insufficient documentation

## 2021-05-24 DIAGNOSIS — M79641 Pain in right hand: Secondary | ICD-10-CM | POA: Diagnosis not present

## 2021-05-24 DIAGNOSIS — S60940A Unspecified superficial injury of right index finger, initial encounter: Secondary | ICD-10-CM | POA: Diagnosis not present

## 2021-05-24 DIAGNOSIS — Z7901 Long term (current) use of anticoagulants: Secondary | ICD-10-CM | POA: Insufficient documentation

## 2021-05-24 DIAGNOSIS — M79644 Pain in right finger(s): Secondary | ICD-10-CM

## 2021-05-24 MED ORDER — ACETAMINOPHEN 325 MG PO TABS
650.0000 mg | ORAL_TABLET | Freq: Once | ORAL | Status: AC
  Administered 2021-05-24: 650 mg via ORAL
  Filled 2021-05-24: qty 2

## 2021-05-24 NOTE — Discharge Instructions (Addendum)
Please follow up with PCP in next 5-7 days for repeat XR of right wrist. *Tenderness to snuff box*  You can take tylenol 650mg  every 6 hour as needed for pain.

## 2021-05-24 NOTE — ED Provider Notes (Signed)
[facility] EMERGENCY DEPARTMENT Provider Note   CSN: [id number] Arrival date & time: 05/24/21  1145     History Chief Complaint  Patient presents with   Finger Injury    Angela House is a 85 y.o. female.  This is a 85 y.o. female with significant medical history as below, including Pe on DOAC who presents to the ED with complaint of right hand/wrist pain. Pt reports she struck wrist against countertop 3-4 days ago and has had ongoing pain to medial aspect of distal radius and to her 3rd, 5th digits. Seen at [facility] yesterday and had negative XR. Was placed in finger splint which pt feels has worsened discomfort and now her 3rd digit has some mild swelling. Taking tylenol 325mg  every 12 hours or so which has not improved her pain. Compliant with DOAC. No fevers or chills, no numbness or tingling, she is right handed.   The history is provided by the patient and a relative. No language interpreter was used.      Past Medical History:  Diagnosis Date   Arthritis    Hypertension    PE (pulmonary thromboembolism) ([place])    bilaterally, 03/2017   Restless leg syndrome     Patient Active Problem List   Diagnosis Date Noted   Chronic anticoagulation 05/07/2018   Recurrent pulmonary embolism ([place]) 01/21/2018   LUQ pain 03/26/2017   Restless leg syndrome 03/05/2017   Borderline prolonged QT interval 03/05/2017   Bilateral pulmonary embolism ([place]) 03/04/2017   Arthritis 03/04/2017   Hypertension 03/04/2017   GERD (gastroesophageal reflux disease) 11/17/2015    Past Surgical History:  Procedure Laterality Date   ABDOMINAL HYSTERECTOMY     CHOLECYSTECTOMY     EYE SURGERY     KNEE SURGERY     WRIST SURGERY       OB History     Gravida      Para      Term      Preterm      AB      Living  0      SAB      IAB      Ectopic      Multiple      Live Births              Family History  Problem Relation Age of Onset   Arthritis Mother    Hypertension  Mother    Hypertension Sister    Pneumonia Brother    Hypertension Sister    Kidney disease Sister    Colon cancer Neg Hx     Social History   Tobacco Use   Smoking status: Never   Smokeless tobacco: Never  Vaping Use   Vaping Use: Never used  Substance Use Topics   Alcohol use: No    Alcohol/week: 0.0 standard drinks   Drug use: No    Home Medications Prior to Admission medications   Medication Sig Start Date End Date Taking? Authorizing Provider  acetaminophen (TYLENOL) 325 MG tablet Take 325 mg by mouth every 6 (six) hours as needed.    [provider]  apixaban (ELIQUIS) 2.5 MG TABS tablet Take 2.5 mg by mouth 2 (two) times daily.    [provider]  ASPERCREME W/LIDOCAINE EX Apply 1 application topically daily as needed (FOR HIP/KNEE PAIN).     [provider]  calcium carbonate (OS-CAL - DOSED IN MG OF ELEMENTAL CALCIUM) 1250 (500 Ca) MG tablet Take 2 tablets by  mouth 2 (two) times daily.     [provider]  Carboxymethylcellulose Sodium 0.25 % SOLN Apply 1 drop to eye as needed.     [provider]  cetirizine (ZYRTEC) 10 MG tablet Take 10 mg by mouth daily as needed.     [provider]  clobetasol (TEMOVATE) 0.05 % external solution Apply 1 application topically as needed. Applied to scalp 06/11/17   [provider]  clobetasol cream (TEMOVATE) 4.09 % Apply 1 application topically daily as needed (FOR RASH).  12/13/15   [provider]  clotrimazole-betamethasone (LOTRISONE) cream as needed.  02/28/19   [provider]  cycloSPORINE (RESTASIS) 0.05 % ophthalmic emulsion Place 1 drop into both eyes 2 (two) times daily.    [provider]  diazepam (VALIUM) 2 MG tablet Take 2 mg by mouth as needed for anxiety.     [provider]  famotidine (PEPCID) 20 MG tablet Take 1 tablet by mouth twice daily 12/17/19   Mahala Menghini, PA-C  fluticasone [facility]) 50 MCG/ACT nasal spray  Place 2 sprays into the nose as needed for allergies or rhinitis.    [provider]  furosemide (LASIX) 20 MG tablet Take by mouth as needed.     [provider]  gabapentin (NEURONTIN) 100 MG capsule Take 100 mg by mouth 3 (three) times daily.     [provider]  lisinopril (ZESTRIL) 10 MG tablet Take 10 mg by mouth daily.    [provider]  Multiple Vitamin (MULTIVITAMIN) capsule Take 1 capsule by mouth every morning.     [provider]  Polyethyl Glycol-Propyl Glycol (SYSTANE) 0.4-0.3 % SOLN Apply 1 drop to eye 4 (four) times daily.    [provider]  Potassium 99 MG TABS Take 1 tablet by mouth every other day.     [provider]  VENTOLIN HFA 108 (90 Base) MCG/ACT inhaler Inhale 1-2 puffs into the lungs every 6 (six) hours as needed.  03/24/17   [provider]    Allergies    Ciprofloxacin and Meclizine  Review of Systems   Review of Systems  Constitutional:  Negative for chills and fever.  HENT:  Negative for facial swelling and trouble swallowing.   Eyes:  Negative for photophobia and visual disturbance.  Respiratory:  Negative for cough and shortness of breath.   Cardiovascular:  Negative for chest pain and palpitations.  Gastrointestinal:  Negative for abdominal pain, nausea and vomiting.  Endocrine: Negative for polydipsia and polyuria.  Genitourinary:  Negative for difficulty urinating and hematuria.  Musculoskeletal:  Positive for arthralgias. Negative for gait problem and joint swelling.  Skin:  Negative for pallor and rash.  Neurological:  Negative for syncope and headaches.  Psychiatric/Behavioral:  Negative for agitation and confusion.    Physical Exam Updated Vital Signs BP (!) 145/68 (BP Location: Left Arm)   Pulse 62   Temp 98.1 F (36.7 C)   Resp 16   SpO2 98%   Physical Exam Vitals and nursing note reviewed.  Constitutional:      General: She is not in acute distress.     Appearance: Normal appearance.  HENT:     Head: Normocephalic and atraumatic.     Right Ear: External ear normal.     Left Ear: External ear normal.     Nose: Nose normal.     Mouth/Throat:     Mouth: Mucous membranes are moist.  Eyes:     General: No scleral  icterus.       Right eye: No discharge.        Left eye: No discharge.  Cardiovascular:     Rate and Rhythm: Normal rate and regular rhythm.     Pulses: Normal pulses.     Heart sounds: Normal heart sounds.  Pulmonary:     Effort: Pulmonary effort is normal. No respiratory distress.     Breath sounds: Normal breath sounds.  Abdominal:     General: Abdomen is flat.     Tenderness: There is no abdominal tenderness.  Musculoskeletal:        General: Normal range of motion.       Hands:     Cervical back: Normal range of motion.     Right lower leg: No edema.     Left lower leg: No edema.     Comments: UE are neurovascularly intact b/l to radial ulnar and median nerve distributions.   Skin:    General: Skin is warm and dry.     Capillary Refill: Capillary refill takes less than 2 seconds.  Neurological:     Mental Status: She is alert.  Psychiatric:        Mood and Affect: Mood normal.        Behavior: Behavior normal.    ED Results / Procedures / Treatments   Labs (all labs ordered are listed, but only abnormal results are displayed) Labs Reviewed - No data to display  EKG None  Radiology DG Wrist Complete Right  Result Date: 05/24/2021 CLINICAL DATA:  Wrist injury EXAM: RIGHT WRIST - COMPLETE 3+ VIEW COMPARISON:  None. FINDINGS: No acute fracture or dislocation identified. Joint spaces are preserved. Mild apparent soft tissue swelling dorsally. IMPRESSION: No acute osseous abnormality identified in the wrist. Electronically Signed   By: Ofilia Neas M.D.   On: 05/24/2021 13:44   DG Hand Complete Right  Result Date: 05/24/2021 CLINICAL DATA:  Right hand injury EXAM: RIGHT HAND - COMPLETE 3+ VIEW  COMPARISON:  None. FINDINGS: No acute fracture or dislocation identified. Mild osteoarthritic changes in the fingers. No significant soft tissue swelling visualized. IMPRESSION: No acute osseous abnormality identified. Electronically Signed   By: Ofilia Neas M.D.   On: 05/24/2021 13:47    Procedures Procedures   Medications Ordered in ED Medications  acetaminophen (TYLENOL) tablet 650 mg (650 mg Oral Given 05/24/21 1337)    ED Course  I have reviewed the triage vital signs and the nursing notes.  Pertinent labs & imaging results that were available during my care of the patient were reviewed by me and considered in my medical decision making (see chart for details).    MDM Rules/Calculators/A&P                           CC: finger pain  This patient complains of above; this involves an extensive number of treatment options and is a complaint that carries with it a high risk of complications and morbidity. Vital signs were reviewed. Serious etiologies considered.  Record review:  Previous records obtained and reviewed   Additional history obtained from sister   Work up as above, notable for:  imaging results that were available during my care of the patient were reviewed by me and considered in my medical decision making.   I ordered imaging studies which included hand/wrist XR and I independently visualized and interpreted imaging which showed no acute process  Management: Pt with recent  injury to right wrist/hand, was seen at UC, negative XR. Still having some pain. Reviewed impression of UC hand xr which was negative. XR obtained of the wrist as pt has tenderness at anatomic snuff box on right wrist. Wirst/hand xr was negative today. Will place in removable wrist splint and have her f/u with PCP in 5-7 days for rpt xr of wrist. Favor minimal swelling of her finger 2/2 finger splint/trauma. There is very low suspicion for flexor tenosynovitis. The area is not erythematous,  she has full ROM to the 3rd digit. Low suspicion for septic joint.   The patient improved significantly and was discharged in stable condition. Detailed discussions were had with the patient regarding current findings, and need for close f/u with PCP or on call doctor. The patient has been instructed to return immediately if the symptoms worsen in any way for re-evaluation. Patient verbalized understanding and is in agreement with current care plan. All questions answered prior to discharge.         This chart was dictated using voice recognition software.  Despite best efforts to proofread,  errors can occur which can change the documentation meaning.  Final Clinical Impression(s) / ED Diagnoses Final diagnoses:  Right wrist pain  Pain of finger of right hand    Rx / DC Orders ED Discharge Orders     None        Jeanell Sparrow, DO 05/24/21 1849

## 2021-05-24 NOTE — ED Triage Notes (Addendum)
Jammed hand in kitchen 4 days ago and wa treated for this in UC yesterday. Xray showed no fx, given splint. Today, pt is concerned for increased swelling. R knuckles appear red, swollen, reduced ROM, PMS intact. Area of swelling is not the same area struck when she jammed her hand.

## 2021-05-24 NOTE — ED Notes (Signed)
Portable x-ray done

## 2021-05-30 ENCOUNTER — Other Ambulatory Visit: Payer: Self-pay

## 2021-05-30 ENCOUNTER — Encounter: Payer: Self-pay | Admitting: Orthopaedic Surgery

## 2021-05-30 ENCOUNTER — Ambulatory Visit (INDEPENDENT_AMBULATORY_CARE_PROVIDER_SITE_OTHER): Payer: Medicare Other | Admitting: Orthopaedic Surgery

## 2021-05-30 VITALS — BP 132/86 | HR 60 | Ht 65.0 in | Wt 185.0 lb

## 2021-05-30 DIAGNOSIS — Z7901 Long term (current) use of anticoagulants: Secondary | ICD-10-CM

## 2021-05-30 DIAGNOSIS — M79641 Pain in right hand: Secondary | ICD-10-CM

## 2021-05-30 NOTE — Progress Notes (Signed)
I hurt my right hand  About ten days ago she hit her right dominant hand against the edge of her cabinet at home.  She has some swelling and pain.  She used ice and Tylenol. She cannot take NSAIDs.  She is on anti-coagulant.  She started having more dorsal swelling.  She called to be seen but schedule was full in [place] last Wednesday so she went to Urgent Care/ER.  X-rays were done and were negative.  She was given cock-up splint.  The swelling has come down, she has gotten pain relief and the motion of the fingers have improved.  Exam shows tender dorsal hand with no swelling.  NV intact. Grip is fair but tender.  There is no redness or fusiform swelling.  Encounter Diagnoses  Name Primary?   Right hand pain Yes   Adequate anticoagulation on anticoagulant therapy    Continue the Tylenol.  Use Aspercreme or Biofreeze.  Use Ice.  Continue the splint.  Return to [place] office next week.  Call if any problem.  Precautions discussed.  Electronically Signed Sanjuana Kava, MD 11/29/20221:48 PM

## 2021-05-31 DIAGNOSIS — Z299 Encounter for prophylactic measures, unspecified: Secondary | ICD-10-CM | POA: Diagnosis not present

## 2021-05-31 DIAGNOSIS — M25531 Pain in right wrist: Secondary | ICD-10-CM | POA: Diagnosis not present

## 2021-05-31 DIAGNOSIS — K219 Gastro-esophageal reflux disease without esophagitis: Secondary | ICD-10-CM | POA: Diagnosis not present

## 2021-05-31 DIAGNOSIS — I10 Essential (primary) hypertension: Secondary | ICD-10-CM | POA: Diagnosis not present

## 2021-05-31 DIAGNOSIS — M1990 Unspecified osteoarthritis, unspecified site: Secondary | ICD-10-CM | POA: Diagnosis not present

## 2021-05-31 DIAGNOSIS — I2699 Other pulmonary embolism without acute cor pulmonale: Secondary | ICD-10-CM | POA: Diagnosis not present

## 2021-05-31 DIAGNOSIS — E041 Nontoxic single thyroid nodule: Secondary | ICD-10-CM | POA: Diagnosis not present

## 2021-05-31 DIAGNOSIS — G629 Polyneuropathy, unspecified: Secondary | ICD-10-CM | POA: Diagnosis not present

## 2021-06-07 ENCOUNTER — Ambulatory Visit (INDEPENDENT_AMBULATORY_CARE_PROVIDER_SITE_OTHER): Payer: Medicare Other | Admitting: Orthopaedic Surgery

## 2021-06-07 ENCOUNTER — Encounter: Payer: Self-pay | Admitting: Orthopaedic Surgery

## 2021-06-07 DIAGNOSIS — M79641 Pain in right hand: Secondary | ICD-10-CM | POA: Diagnosis not present

## 2021-06-07 DIAGNOSIS — Z7901 Long term (current) use of anticoagulants: Secondary | ICD-10-CM | POA: Diagnosis not present

## 2021-06-07 MED ORDER — PREDNISONE 5 MG (21) PO TBPK: ORAL_TABLET | ORAL | 0 refills | Status: DC

## 2021-06-07 NOTE — Progress Notes (Signed)
My hand is not as swollen but it hurts.  She has improvement with the right hand pain.  The swelling is gone.  But she hurts still.  She has no new trauma.  She is active.  She has no numbness.  Right hand has no swelling now, no dorsal swelling. ROM is full.  NV intact.  She is tender over the mid dorsal hand.  Encounter Diagnoses  Name Primary?   Right hand pain Yes   Adequate anticoagulation on anticoagulant therapy    I will begin prednisone dose pack.  Get [employer] and work with it.  Return in two weeks.  Call if any problem.  Precautions discussed.  Electronically Signed Sanjuana Kava, MD 12/7/202210:34 AM

## 2021-06-21 ENCOUNTER — Ambulatory Visit (INDEPENDENT_AMBULATORY_CARE_PROVIDER_SITE_OTHER): Payer: Medicare Other | Admitting: Orthopaedic Surgery

## 2021-06-21 ENCOUNTER — Encounter: Payer: Self-pay | Admitting: Orthopaedic Surgery

## 2021-06-21 VITALS — Ht 65.0 in | Wt 185.0 lb

## 2021-06-21 DIAGNOSIS — M79641 Pain in right hand: Secondary | ICD-10-CM

## 2021-06-21 NOTE — Progress Notes (Signed)
My hand is better.  Her right hand has no swelling and the pain is almost gone.  ROM is full, NV intact, Grips normal.  Encounter Diagnosis  Name Primary?   Right hand pain Yes   I will see prn.  Call if any problem.  Precautions discussed.  Electronically Signed Sanjuana Kava, MD 12/21/202210:44 AM

## 2021-06-30 DIAGNOSIS — I10 Essential (primary) hypertension: Secondary | ICD-10-CM | POA: Diagnosis not present

## 2021-06-30 DIAGNOSIS — M1990 Unspecified osteoarthritis, unspecified site: Secondary | ICD-10-CM | POA: Diagnosis not present

## 2021-06-30 DIAGNOSIS — I2699 Other pulmonary embolism without acute cor pulmonale: Secondary | ICD-10-CM | POA: Diagnosis not present

## 2021-07-26 DIAGNOSIS — Z299 Encounter for prophylactic measures, unspecified: Secondary | ICD-10-CM | POA: Diagnosis not present

## 2021-07-26 DIAGNOSIS — Z789 Other specified health status: Secondary | ICD-10-CM | POA: Diagnosis not present

## 2021-07-26 DIAGNOSIS — J029 Acute pharyngitis, unspecified: Secondary | ICD-10-CM | POA: Diagnosis not present

## 2021-07-26 DIAGNOSIS — R059 Cough, unspecified: Secondary | ICD-10-CM | POA: Diagnosis not present

## 2021-07-31 DIAGNOSIS — J069 Acute upper respiratory infection, unspecified: Secondary | ICD-10-CM | POA: Diagnosis not present

## 2021-07-31 DIAGNOSIS — I10 Essential (primary) hypertension: Secondary | ICD-10-CM | POA: Diagnosis not present

## 2021-07-31 DIAGNOSIS — Z789 Other specified health status: Secondary | ICD-10-CM | POA: Diagnosis not present

## 2021-07-31 DIAGNOSIS — Z299 Encounter for prophylactic measures, unspecified: Secondary | ICD-10-CM | POA: Diagnosis not present

## 2021-08-01 DIAGNOSIS — I10 Essential (primary) hypertension: Secondary | ICD-10-CM | POA: Diagnosis not present

## 2021-08-01 DIAGNOSIS — M1990 Unspecified osteoarthritis, unspecified site: Secondary | ICD-10-CM | POA: Diagnosis not present

## 2021-08-01 DIAGNOSIS — I2699 Other pulmonary embolism without acute cor pulmonale: Secondary | ICD-10-CM | POA: Diagnosis not present

## 2021-08-17 DIAGNOSIS — M79675 Pain in left toe(s): Secondary | ICD-10-CM | POA: Diagnosis not present

## 2021-08-17 DIAGNOSIS — L110 Acquired keratosis follicularis: Secondary | ICD-10-CM | POA: Diagnosis not present

## 2021-08-17 DIAGNOSIS — I739 Peripheral vascular disease, unspecified: Secondary | ICD-10-CM | POA: Diagnosis not present

## 2021-08-17 DIAGNOSIS — M79671 Pain in right foot: Secondary | ICD-10-CM | POA: Diagnosis not present

## 2021-08-17 DIAGNOSIS — M79672 Pain in left foot: Secondary | ICD-10-CM | POA: Diagnosis not present

## 2021-08-17 DIAGNOSIS — M79674 Pain in right toe(s): Secondary | ICD-10-CM | POA: Diagnosis not present

## 2021-08-25 DIAGNOSIS — Z299 Encounter for prophylactic measures, unspecified: Secondary | ICD-10-CM | POA: Diagnosis not present

## 2021-08-25 DIAGNOSIS — I10 Essential (primary) hypertension: Secondary | ICD-10-CM | POA: Diagnosis not present

## 2021-08-25 DIAGNOSIS — R079 Chest pain, unspecified: Secondary | ICD-10-CM | POA: Diagnosis not present

## 2021-08-25 DIAGNOSIS — Z789 Other specified health status: Secondary | ICD-10-CM | POA: Diagnosis not present

## 2021-08-29 DIAGNOSIS — I2699 Other pulmonary embolism without acute cor pulmonale: Secondary | ICD-10-CM | POA: Diagnosis not present

## 2021-08-29 DIAGNOSIS — I10 Essential (primary) hypertension: Secondary | ICD-10-CM | POA: Diagnosis not present

## 2021-08-29 DIAGNOSIS — M1990 Unspecified osteoarthritis, unspecified site: Secondary | ICD-10-CM | POA: Diagnosis not present

## 2021-09-07 ENCOUNTER — Ambulatory Visit (INDEPENDENT_AMBULATORY_CARE_PROVIDER_SITE_OTHER): Payer: Medicare Other | Admitting: Orthopaedic Surgery

## 2021-09-07 ENCOUNTER — Other Ambulatory Visit: Payer: Self-pay

## 2021-09-07 ENCOUNTER — Ambulatory Visit: Payer: Medicare Other

## 2021-09-07 ENCOUNTER — Encounter: Payer: Self-pay | Admitting: Orthopaedic Surgery

## 2021-09-07 VITALS — BP 136/69 | HR 69 | Ht 65.0 in | Wt 186.6 lb

## 2021-09-07 DIAGNOSIS — M542 Cervicalgia: Secondary | ICD-10-CM | POA: Diagnosis not present

## 2021-09-07 DIAGNOSIS — Z7901 Long term (current) use of anticoagulants: Secondary | ICD-10-CM | POA: Diagnosis not present

## 2021-09-07 MED ORDER — HYDROCODONE-ACETAMINOPHEN 5-325 MG PO TABS
ORAL_TABLET | ORAL | 0 refills | Status: DC
Start: 2021-09-07 — End: 2023-01-01

## 2021-09-07 NOTE — Progress Notes (Signed)
My neck and right arm hurts. ? ?She has had more pain of the right side of her neck and radiates to the right shoulder and down the arm to the index finger.  It is getting worse.  She has no trauma, no redness, no swelling.  She has been on prednisone in the past which helped.  She is on Eliquis and cannot take NSAIDs. ? ?She has painful ROM of the right neck more to the right.  She has paresthesias to the index finger on the right but no weakness.  NV intact. Grip is good. ? ?X-rays of the cervical spine were done, reported separately. ? ?Encounter Diagnoses  ?Name Primary?  ? Neck pain Yes  ? Chronic anticoagulation   ? ?I am concerned about HNP of the cervical spine C6 or C7. ? ?I will get MRI. ? ?I will give pain medicine. ? ?I have reviewed the [place] web site prior to prescribing narcotic medicine for this patient. ? ?Return in two weeks. ? ?Continue her Neurontin. ? ?Call if any problem. ? ?Precautions discussed. ? ?Electronically Signed ?Sanjuana Kava, MD ?3/9/202311:22 AM ? ?

## 2021-09-18 DIAGNOSIS — I10 Essential (primary) hypertension: Secondary | ICD-10-CM | POA: Diagnosis not present

## 2021-09-18 DIAGNOSIS — M25531 Pain in right wrist: Secondary | ICD-10-CM | POA: Diagnosis not present

## 2021-09-18 DIAGNOSIS — Z789 Other specified health status: Secondary | ICD-10-CM | POA: Diagnosis not present

## 2021-09-18 DIAGNOSIS — L0390 Cellulitis, unspecified: Secondary | ICD-10-CM | POA: Diagnosis not present

## 2021-09-21 NOTE — Addendum Note (Signed)
Addended by: Derek Mound A on: 09/21/2021 11:22 AM ? ? Modules accepted: Orders ? ?

## 2021-09-26 DIAGNOSIS — M1990 Unspecified osteoarthritis, unspecified site: Secondary | ICD-10-CM | POA: Diagnosis not present

## 2021-09-26 DIAGNOSIS — Z299 Encounter for prophylactic measures, unspecified: Secondary | ICD-10-CM | POA: Diagnosis not present

## 2021-09-26 DIAGNOSIS — Z789 Other specified health status: Secondary | ICD-10-CM | POA: Diagnosis not present

## 2021-09-26 DIAGNOSIS — I10 Essential (primary) hypertension: Secondary | ICD-10-CM | POA: Diagnosis not present

## 2021-09-27 ENCOUNTER — Ambulatory Visit: Payer: Medicare Other | Admitting: Orthopaedic Surgery

## 2021-10-10 DIAGNOSIS — Z1231 Encounter for screening mammogram for malignant neoplasm of breast: Secondary | ICD-10-CM | POA: Diagnosis not present

## 2021-10-13 ENCOUNTER — Ambulatory Visit (HOSPITAL_COMMUNITY)
Admission: RE | Admit: 2021-10-13 | Discharge: 2021-10-13 | Disposition: A | Discharge status: Home / Self Care | Payer: Medicare Other | Source: Ambulatory Visit | Attending: Orthopaedic Surgery | Admitting: Orthopaedic Surgery

## 2021-10-13 DIAGNOSIS — M5021 Other cervical disc displacement,  high cervical region: Secondary | ICD-10-CM | POA: Diagnosis not present

## 2021-10-13 DIAGNOSIS — M542 Cervicalgia: Secondary | ICD-10-CM

## 2021-10-13 DIAGNOSIS — R202 Paresthesia of skin: Secondary | ICD-10-CM | POA: Diagnosis not present

## 2021-10-13 DIAGNOSIS — M4312 Spondylolisthesis, cervical region: Secondary | ICD-10-CM | POA: Diagnosis not present

## 2021-10-13 DIAGNOSIS — M50221 Other cervical disc displacement at C4-C5 level: Secondary | ICD-10-CM | POA: Diagnosis not present

## 2021-10-17 ENCOUNTER — Ambulatory Visit (INDEPENDENT_AMBULATORY_CARE_PROVIDER_SITE_OTHER): Payer: Medicare Other | Admitting: Orthopaedic Surgery

## 2021-10-17 ENCOUNTER — Encounter: Payer: Self-pay | Admitting: Orthopaedic Surgery

## 2021-10-17 DIAGNOSIS — Z7901 Long term (current) use of anticoagulants: Secondary | ICD-10-CM | POA: Diagnosis not present

## 2021-10-17 DIAGNOSIS — M542 Cervicalgia: Secondary | ICD-10-CM | POA: Diagnosis not present

## 2021-10-17 NOTE — Progress Notes (Signed)
My neck is a little better. ? ?She had the MRI of the cervical spine showing: ?IMPRESSION: ?1. 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 ?2. Diffusely patent appearance of the canal and foramina. ?  ?I have explained the findings to her.  I have told her she does not need surgery at this point. ? ?She is improved. She has little paresthesias to the right hand now. ROM of neck is full.  NV intact. ? ?I have recommended Biofreeze or Voltaren Gel. ? ?Encounter Diagnoses  ?Name Primary?  ? Neck pain Yes  ? Chronic anticoagulation   ? Cervicalgia   ? ?I have independently reviewed the MRI.   ? ?Return in two months. ? ?Call if any problem. ? ?Precautions discussed. ? ?Electronically Signed ?Sanjuana Kava, MD ?4/18/20239:11 AM ? ?

## 2021-11-01 DIAGNOSIS — Z299 Encounter for prophylactic measures, unspecified: Secondary | ICD-10-CM | POA: Diagnosis not present

## 2021-11-01 DIAGNOSIS — I700 Atherosclerosis of aorta: Secondary | ICD-10-CM | POA: Diagnosis not present

## 2021-11-01 DIAGNOSIS — Z0000 Encounter for general adult medical examination without abnormal findings: Secondary | ICD-10-CM | POA: Diagnosis not present

## 2021-11-01 DIAGNOSIS — I10 Essential (primary) hypertension: Secondary | ICD-10-CM | POA: Diagnosis not present

## 2021-11-02 ENCOUNTER — Ambulatory Visit (INDEPENDENT_AMBULATORY_CARE_PROVIDER_SITE_OTHER): Payer: Medicare Other | Admitting: Orthopaedic Surgery

## 2021-11-02 ENCOUNTER — Encounter: Payer: Self-pay | Admitting: Orthopaedic Surgery

## 2021-11-02 VITALS — BP 136/86 | HR 74 | Ht 65.0 in | Wt 186.0 lb

## 2021-11-02 DIAGNOSIS — Z7901 Long term (current) use of anticoagulants: Secondary | ICD-10-CM

## 2021-11-02 DIAGNOSIS — M7062 Trochanteric bursitis, left hip: Secondary | ICD-10-CM

## 2021-11-02 NOTE — Progress Notes (Signed)
PROCEDURE NOTE: ? ?The patient request injection, verbal consent was obtained. ? ?The left trochanteric area of the hip was prepped appropriately after time out was performed.  ? ?Sterile technique was observed and injection of 1 cc of DepoMedrol '40mg'$  with several cc's of plain xylocaine. Anesthesia was provided by ethyl chloride and a 20-gauge needle was used to inject the hip area. The injection was tolerated well. ? ?A band aid dressing was applied. ? ?The patient was advised to apply ice later today and tomorrow to the injection sight as needed. ? ?Encounter Diagnoses  ?Name Primary?  ? Trochanteric bursitis, left hip Yes  ? Chronic anticoagulation   ? ?Return in six weeks. ? ?Call if any problem. ? ?Precautions discussed. ? ?Electronically Signed ?Sanjuana Kava, MD ?5/4/20239:44 AM ? ?

## 2021-11-07 ENCOUNTER — Telehealth: Payer: Self-pay

## 2021-11-07 NOTE — Telephone Encounter (Signed)
Patient left message on voicemail stating that her leg has continued to get worse since she got an injection on 11/02/21. ?She is asking what can she do to help alleviate the pain. ? ?Please advise ?

## 2021-11-08 DIAGNOSIS — M25552 Pain in left hip: Secondary | ICD-10-CM | POA: Diagnosis not present

## 2021-11-08 DIAGNOSIS — Z299 Encounter for prophylactic measures, unspecified: Secondary | ICD-10-CM | POA: Diagnosis not present

## 2021-11-08 DIAGNOSIS — I10 Essential (primary) hypertension: Secondary | ICD-10-CM | POA: Diagnosis not present

## 2021-11-08 DIAGNOSIS — Z713 Dietary counseling and surveillance: Secondary | ICD-10-CM | POA: Diagnosis not present

## 2021-11-09 NOTE — Telephone Encounter (Signed)
ERROR

## 2021-11-14 DIAGNOSIS — I158 Other secondary hypertension: Secondary | ICD-10-CM | POA: Diagnosis not present

## 2021-11-14 DIAGNOSIS — Z7901 Long term (current) use of anticoagulants: Secondary | ICD-10-CM | POA: Diagnosis not present

## 2021-11-14 DIAGNOSIS — I2699 Other pulmonary embolism without acute cor pulmonale: Secondary | ICD-10-CM | POA: Diagnosis not present

## 2021-11-16 DIAGNOSIS — L110 Acquired keratosis follicularis: Secondary | ICD-10-CM | POA: Diagnosis not present

## 2021-11-16 DIAGNOSIS — M79675 Pain in left toe(s): Secondary | ICD-10-CM | POA: Diagnosis not present

## 2021-11-16 DIAGNOSIS — M79674 Pain in right toe(s): Secondary | ICD-10-CM | POA: Diagnosis not present

## 2021-11-16 DIAGNOSIS — M79671 Pain in right foot: Secondary | ICD-10-CM | POA: Diagnosis not present

## 2021-11-16 DIAGNOSIS — I739 Peripheral vascular disease, unspecified: Secondary | ICD-10-CM | POA: Diagnosis not present

## 2021-11-16 DIAGNOSIS — M79672 Pain in left foot: Secondary | ICD-10-CM | POA: Diagnosis not present

## 2021-11-21 DIAGNOSIS — Z7901 Long term (current) use of anticoagulants: Secondary | ICD-10-CM | POA: Diagnosis not present

## 2021-11-22 DIAGNOSIS — I10 Essential (primary) hypertension: Secondary | ICD-10-CM | POA: Diagnosis not present

## 2021-11-22 DIAGNOSIS — M5136 Other intervertebral disc degeneration, lumbar region: Secondary | ICD-10-CM | POA: Diagnosis not present

## 2021-11-22 DIAGNOSIS — M25559 Pain in unspecified hip: Secondary | ICD-10-CM | POA: Diagnosis not present

## 2021-11-22 DIAGNOSIS — Z299 Encounter for prophylactic measures, unspecified: Secondary | ICD-10-CM | POA: Diagnosis not present

## 2021-11-29 DIAGNOSIS — M25552 Pain in left hip: Secondary | ICD-10-CM | POA: Diagnosis not present

## 2021-11-29 DIAGNOSIS — Z7409 Other reduced mobility: Secondary | ICD-10-CM | POA: Diagnosis not present

## 2021-11-29 DIAGNOSIS — R2681 Unsteadiness on feet: Secondary | ICD-10-CM | POA: Diagnosis not present

## 2021-11-29 DIAGNOSIS — R2689 Other abnormalities of gait and mobility: Secondary | ICD-10-CM | POA: Diagnosis not present

## 2021-11-29 DIAGNOSIS — M545 Low back pain, unspecified: Secondary | ICD-10-CM | POA: Diagnosis not present

## 2021-12-01 DIAGNOSIS — R2681 Unsteadiness on feet: Secondary | ICD-10-CM | POA: Diagnosis not present

## 2021-12-01 DIAGNOSIS — Z7409 Other reduced mobility: Secondary | ICD-10-CM | POA: Diagnosis not present

## 2021-12-01 DIAGNOSIS — R2689 Other abnormalities of gait and mobility: Secondary | ICD-10-CM | POA: Diagnosis not present

## 2021-12-01 DIAGNOSIS — M25552 Pain in left hip: Secondary | ICD-10-CM | POA: Diagnosis not present

## 2021-12-01 DIAGNOSIS — M545 Low back pain, unspecified: Secondary | ICD-10-CM | POA: Diagnosis not present

## 2021-12-04 DIAGNOSIS — Z7409 Other reduced mobility: Secondary | ICD-10-CM | POA: Diagnosis not present

## 2021-12-04 DIAGNOSIS — R2689 Other abnormalities of gait and mobility: Secondary | ICD-10-CM | POA: Diagnosis not present

## 2021-12-04 DIAGNOSIS — R2681 Unsteadiness on feet: Secondary | ICD-10-CM | POA: Diagnosis not present

## 2021-12-04 DIAGNOSIS — M25552 Pain in left hip: Secondary | ICD-10-CM | POA: Diagnosis not present

## 2021-12-04 DIAGNOSIS — M545 Low back pain, unspecified: Secondary | ICD-10-CM | POA: Diagnosis not present

## 2021-12-06 ENCOUNTER — Encounter: Payer: Self-pay | Admitting: Orthopaedic Surgery

## 2021-12-06 ENCOUNTER — Ambulatory Visit (INDEPENDENT_AMBULATORY_CARE_PROVIDER_SITE_OTHER): Payer: Medicare Other | Admitting: Orthopaedic Surgery

## 2021-12-06 VITALS — Ht 65.0 in | Wt 186.0 lb

## 2021-12-06 DIAGNOSIS — M545 Low back pain, unspecified: Secondary | ICD-10-CM | POA: Diagnosis not present

## 2021-12-06 DIAGNOSIS — M5442 Lumbago with sciatica, left side: Secondary | ICD-10-CM

## 2021-12-06 DIAGNOSIS — R2689 Other abnormalities of gait and mobility: Secondary | ICD-10-CM | POA: Diagnosis not present

## 2021-12-06 DIAGNOSIS — R2681 Unsteadiness on feet: Secondary | ICD-10-CM | POA: Diagnosis not present

## 2021-12-06 DIAGNOSIS — G8929 Other chronic pain: Secondary | ICD-10-CM | POA: Diagnosis not present

## 2021-12-06 DIAGNOSIS — M25552 Pain in left hip: Secondary | ICD-10-CM | POA: Diagnosis not present

## 2021-12-06 DIAGNOSIS — Z7409 Other reduced mobility: Secondary | ICD-10-CM | POA: Diagnosis not present

## 2021-12-06 MED ORDER — CYCLOBENZAPRINE HCL 10 MG PO TABS: 10.0000 mg | ORAL_TABLET | Freq: Every day | ORAL | 0 refills | Status: DC

## 2021-12-06 NOTE — Progress Notes (Signed)
My pain is worse.  She has pain now running down the left lower back, left buttocks, posterior thigh, past knee to the left foot.  She has more pain at night.  She has been to PT but has no help.  She has no new trauma.    She has a sister in very poor health in [place] but cannot travel to see her because of the pain.  She takes Tylenol and does exercises with no help.  Spine/Pelvis examination:  Inspection:  Overall, sacoiliac joint benign and hips nontender; without crepitus or defects.   Thoracic spine inspection: Alignment normal without kyphosis present   Lumbar spine inspection:  Alignment  with normal lumbar lordosis, without scoliosis apparent.   Thoracic spine palpation:  without tenderness of spinal processes   Lumbar spine palpation: with tenderness of lumbar area; with tightness of lumbar muscles    Range of Motion:   Lumbar flexion, forward flexion is 30 with pain or tenderness    Lumbar extension is 5 with pain or tenderness   Left lateral bend is Normal  with pain or tenderness   Right lateral bend is Normal with pain or tenderness   Straight leg raising is Normal   Strength & tone: Normal   Stability overall normal stability    Encounter Diagnosis  Name Primary?   Chronic left-sided low back pain with left-sided sciatica Yes   I will get MRI of the lumbar spine as I am concerned about HNP of lower lumbar area.  She is not improving, has been to PT.  Return in two weeks.  Call if any problem.  Precautions discussed.  I will give Flexeril for night time use for spasm.  Electronically Signed Sanjuana Kava, MD 6/7/20239:47 AM

## 2021-12-06 NOTE — Addendum Note (Signed)
Addended by: Derek [place]ound A on: 12/06/2021 10:20 AM   Modules accepted: Orders

## 2021-12-11 DIAGNOSIS — M545 Low back pain, unspecified: Secondary | ICD-10-CM | POA: Diagnosis not present

## 2021-12-11 DIAGNOSIS — R2681 Unsteadiness on feet: Secondary | ICD-10-CM | POA: Diagnosis not present

## 2021-12-11 DIAGNOSIS — Z7409 Other reduced mobility: Secondary | ICD-10-CM | POA: Diagnosis not present

## 2021-12-11 DIAGNOSIS — R2689 Other abnormalities of gait and mobility: Secondary | ICD-10-CM | POA: Diagnosis not present

## 2021-12-11 DIAGNOSIS — M25552 Pain in left hip: Secondary | ICD-10-CM | POA: Diagnosis not present

## 2021-12-14 DIAGNOSIS — M25552 Pain in left hip: Secondary | ICD-10-CM | POA: Diagnosis not present

## 2021-12-14 DIAGNOSIS — R2689 Other abnormalities of gait and mobility: Secondary | ICD-10-CM | POA: Diagnosis not present

## 2021-12-14 DIAGNOSIS — R2681 Unsteadiness on feet: Secondary | ICD-10-CM | POA: Diagnosis not present

## 2021-12-14 DIAGNOSIS — Z7409 Other reduced mobility: Secondary | ICD-10-CM | POA: Diagnosis not present

## 2021-12-14 DIAGNOSIS — M545 Low back pain, unspecified: Secondary | ICD-10-CM | POA: Diagnosis not present

## 2021-12-19 ENCOUNTER — Encounter (HOSPITAL_COMMUNITY): Payer: Self-pay

## 2021-12-19 ENCOUNTER — Ambulatory Visit (HOSPITAL_COMMUNITY): Payer: Medicare Other

## 2021-12-20 ENCOUNTER — Ambulatory Visit: Payer: Medicare Other | Admitting: Orthopaedic Surgery

## 2021-12-25 DIAGNOSIS — J329 Chronic sinusitis, unspecified: Secondary | ICD-10-CM | POA: Diagnosis not present

## 2021-12-25 DIAGNOSIS — Z299 Encounter for prophylactic measures, unspecified: Secondary | ICD-10-CM | POA: Diagnosis not present

## 2021-12-25 DIAGNOSIS — I10 Essential (primary) hypertension: Secondary | ICD-10-CM | POA: Diagnosis not present

## 2021-12-27 DIAGNOSIS — H1045 Other chronic allergic conjunctivitis: Secondary | ICD-10-CM | POA: Diagnosis not present

## 2021-12-27 DIAGNOSIS — H5211 Myopia, right eye: Secondary | ICD-10-CM | POA: Diagnosis not present

## 2021-12-27 DIAGNOSIS — Z961 Presence of intraocular lens: Secondary | ICD-10-CM | POA: Diagnosis not present

## 2021-12-29 DIAGNOSIS — M25552 Pain in left hip: Secondary | ICD-10-CM | POA: Diagnosis not present

## 2021-12-29 DIAGNOSIS — Z7409 Other reduced mobility: Secondary | ICD-10-CM | POA: Diagnosis not present

## 2021-12-29 DIAGNOSIS — R2681 Unsteadiness on feet: Secondary | ICD-10-CM | POA: Diagnosis not present

## 2021-12-29 DIAGNOSIS — R2689 Other abnormalities of gait and mobility: Secondary | ICD-10-CM | POA: Diagnosis not present

## 2021-12-29 DIAGNOSIS — M545 Low back pain, unspecified: Secondary | ICD-10-CM | POA: Diagnosis not present

## 2022-01-04 DIAGNOSIS — M25552 Pain in left hip: Secondary | ICD-10-CM | POA: Diagnosis not present

## 2022-01-04 DIAGNOSIS — R2681 Unsteadiness on feet: Secondary | ICD-10-CM | POA: Diagnosis not present

## 2022-01-04 DIAGNOSIS — M545 Low back pain, unspecified: Secondary | ICD-10-CM | POA: Diagnosis not present

## 2022-01-04 DIAGNOSIS — Z7409 Other reduced mobility: Secondary | ICD-10-CM | POA: Diagnosis not present

## 2022-01-04 DIAGNOSIS — R2689 Other abnormalities of gait and mobility: Secondary | ICD-10-CM | POA: Diagnosis not present

## 2022-01-05 DIAGNOSIS — M25552 Pain in left hip: Secondary | ICD-10-CM | POA: Diagnosis not present

## 2022-01-05 DIAGNOSIS — R2681 Unsteadiness on feet: Secondary | ICD-10-CM | POA: Diagnosis not present

## 2022-01-05 DIAGNOSIS — Z7409 Other reduced mobility: Secondary | ICD-10-CM | POA: Diagnosis not present

## 2022-01-05 DIAGNOSIS — R2689 Other abnormalities of gait and mobility: Secondary | ICD-10-CM | POA: Diagnosis not present

## 2022-01-05 DIAGNOSIS — M545 Low back pain, unspecified: Secondary | ICD-10-CM | POA: Diagnosis not present

## 2022-01-08 DIAGNOSIS — M545 Low back pain, unspecified: Secondary | ICD-10-CM | POA: Diagnosis not present

## 2022-01-08 DIAGNOSIS — R2689 Other abnormalities of gait and mobility: Secondary | ICD-10-CM | POA: Diagnosis not present

## 2022-01-08 DIAGNOSIS — Z7409 Other reduced mobility: Secondary | ICD-10-CM | POA: Diagnosis not present

## 2022-01-08 DIAGNOSIS — M25552 Pain in left hip: Secondary | ICD-10-CM | POA: Diagnosis not present

## 2022-01-08 DIAGNOSIS — R2681 Unsteadiness on feet: Secondary | ICD-10-CM | POA: Diagnosis not present

## 2022-01-10 DIAGNOSIS — R2681 Unsteadiness on feet: Secondary | ICD-10-CM | POA: Diagnosis not present

## 2022-01-10 DIAGNOSIS — M545 Low back pain, unspecified: Secondary | ICD-10-CM | POA: Diagnosis not present

## 2022-01-10 DIAGNOSIS — R2689 Other abnormalities of gait and mobility: Secondary | ICD-10-CM | POA: Diagnosis not present

## 2022-01-10 DIAGNOSIS — Z7409 Other reduced mobility: Secondary | ICD-10-CM | POA: Diagnosis not present

## 2022-01-10 DIAGNOSIS — M25552 Pain in left hip: Secondary | ICD-10-CM | POA: Diagnosis not present

## 2022-01-11 ENCOUNTER — Ambulatory Visit (HOSPITAL_COMMUNITY)
Admission: RE | Admit: 2022-01-11 | Discharge: 2022-01-11 | Disposition: A | Discharge status: Home / Self Care | Payer: Medicare Other | Source: Ambulatory Visit | Attending: Orthopaedic Surgery | Admitting: Orthopaedic Surgery

## 2022-01-11 DIAGNOSIS — M5442 Lumbago with sciatica, left side: Secondary | ICD-10-CM | POA: Insufficient documentation

## 2022-01-11 DIAGNOSIS — G8929 Other chronic pain: Secondary | ICD-10-CM | POA: Insufficient documentation

## 2022-01-11 DIAGNOSIS — M545 Low back pain, unspecified: Secondary | ICD-10-CM | POA: Diagnosis not present

## 2022-01-11 DIAGNOSIS — M5136 Other intervertebral disc degeneration, lumbar region: Secondary | ICD-10-CM | POA: Diagnosis not present

## 2022-01-15 DIAGNOSIS — M25552 Pain in left hip: Secondary | ICD-10-CM | POA: Diagnosis not present

## 2022-01-15 DIAGNOSIS — R2681 Unsteadiness on feet: Secondary | ICD-10-CM | POA: Diagnosis not present

## 2022-01-15 DIAGNOSIS — M545 Low back pain, unspecified: Secondary | ICD-10-CM | POA: Diagnosis not present

## 2022-01-15 DIAGNOSIS — R2689 Other abnormalities of gait and mobility: Secondary | ICD-10-CM | POA: Diagnosis not present

## 2022-01-15 DIAGNOSIS — Z7409 Other reduced mobility: Secondary | ICD-10-CM | POA: Diagnosis not present

## 2022-01-16 ENCOUNTER — Encounter: Payer: Self-pay | Admitting: Orthopaedic Surgery

## 2022-01-16 ENCOUNTER — Ambulatory Visit (INDEPENDENT_AMBULATORY_CARE_PROVIDER_SITE_OTHER): Payer: Medicare Other | Admitting: Orthopaedic Surgery

## 2022-01-16 DIAGNOSIS — M5442 Lumbago with sciatica, left side: Secondary | ICD-10-CM | POA: Diagnosis not present

## 2022-01-16 DIAGNOSIS — Z7901 Long term (current) use of anticoagulants: Secondary | ICD-10-CM

## 2022-01-16 DIAGNOSIS — G8929 Other chronic pain: Secondary | ICD-10-CM | POA: Diagnosis not present

## 2022-01-16 NOTE — Progress Notes (Signed)
I feel better.  She has lower back pain, more on the left side.  She had MRI which showed: IMPRESSION: 1. Progressive degenerative disc disease at L3-L4 with unchanged moderate right neuroforaminal stenosis and potential irritation of the exiting right L3 nerve root. 2. Unchanged moderate left lateral recess and neuroforaminal stenosis at L4-L5.   I have explained the findings to her.  She would like to hold off any epidural for now.  She got to see her very ill sister in [place] recently.  Both feel better seeing each other.  Her sister has terminal illness.  She has no weakness, no numbness of her back.  She is active and taking Tylenol for pain.  She has been to PT and it has helped a lot.  Spine/Pelvis examination:  Inspection:  Overall, sacoiliac joint benign and hips nontender; without crepitus or defects.   Thoracic spine inspection: Alignment normal without kyphosis present   Lumbar spine inspection:  Alignment  with normal lumbar lordosis, without scoliosis apparent.   Thoracic spine palpation:  without tenderness of spinal processes   Lumbar spine palpation: without tenderness of lumbar area; without tightness of lumbar muscles    Range of Motion:   Lumbar flexion, forward flexion is normal without pain or tenderness    Lumbar extension is full without pain or tenderness   Left lateral bend is normal without pain or tenderness   Right lateral bend is normal without pain or tenderness   Straight leg raising is normal  Strength & tone: normal   Stability overall normal stability  Encounter Diagnoses  Name Primary?   Chronic left-sided low back pain with left-sided sciatica Yes   Chronic anticoagulation    I will see her in two months.  She can stop PT when she feels she is at maximum improvement.  Call if any problem.  Precautions discussed.  Electronically Signed Sanjuana Kava, MD 7/18/20231:33 PM

## 2022-01-18 DIAGNOSIS — R2689 Other abnormalities of gait and mobility: Secondary | ICD-10-CM | POA: Diagnosis not present

## 2022-01-18 DIAGNOSIS — Z7409 Other reduced mobility: Secondary | ICD-10-CM | POA: Diagnosis not present

## 2022-01-18 DIAGNOSIS — M25552 Pain in left hip: Secondary | ICD-10-CM | POA: Diagnosis not present

## 2022-01-18 DIAGNOSIS — M545 Low back pain, unspecified: Secondary | ICD-10-CM | POA: Diagnosis not present

## 2022-01-18 DIAGNOSIS — R2681 Unsteadiness on feet: Secondary | ICD-10-CM | POA: Diagnosis not present

## 2022-01-22 DIAGNOSIS — R2681 Unsteadiness on feet: Secondary | ICD-10-CM | POA: Diagnosis not present

## 2022-01-22 DIAGNOSIS — R2689 Other abnormalities of gait and mobility: Secondary | ICD-10-CM | POA: Diagnosis not present

## 2022-01-22 DIAGNOSIS — M545 Low back pain, unspecified: Secondary | ICD-10-CM | POA: Diagnosis not present

## 2022-01-22 DIAGNOSIS — M25552 Pain in left hip: Secondary | ICD-10-CM | POA: Diagnosis not present

## 2022-01-22 DIAGNOSIS — Z7409 Other reduced mobility: Secondary | ICD-10-CM | POA: Diagnosis not present

## 2022-01-24 DIAGNOSIS — R2681 Unsteadiness on feet: Secondary | ICD-10-CM | POA: Diagnosis not present

## 2022-01-24 DIAGNOSIS — Z7409 Other reduced mobility: Secondary | ICD-10-CM | POA: Diagnosis not present

## 2022-01-24 DIAGNOSIS — R2689 Other abnormalities of gait and mobility: Secondary | ICD-10-CM | POA: Diagnosis not present

## 2022-01-24 DIAGNOSIS — M25552 Pain in left hip: Secondary | ICD-10-CM | POA: Diagnosis not present

## 2022-01-24 DIAGNOSIS — M545 Low back pain, unspecified: Secondary | ICD-10-CM | POA: Diagnosis not present

## 2022-01-29 DIAGNOSIS — M545 Low back pain, unspecified: Secondary | ICD-10-CM | POA: Diagnosis not present

## 2022-01-29 DIAGNOSIS — R2689 Other abnormalities of gait and mobility: Secondary | ICD-10-CM | POA: Diagnosis not present

## 2022-01-29 DIAGNOSIS — R2681 Unsteadiness on feet: Secondary | ICD-10-CM | POA: Diagnosis not present

## 2022-01-29 DIAGNOSIS — M25552 Pain in left hip: Secondary | ICD-10-CM | POA: Diagnosis not present

## 2022-01-29 DIAGNOSIS — Z7409 Other reduced mobility: Secondary | ICD-10-CM | POA: Diagnosis not present

## 2022-01-31 DIAGNOSIS — M545 Low back pain, unspecified: Secondary | ICD-10-CM | POA: Diagnosis not present

## 2022-01-31 DIAGNOSIS — R2681 Unsteadiness on feet: Secondary | ICD-10-CM | POA: Diagnosis not present

## 2022-01-31 DIAGNOSIS — Z7409 Other reduced mobility: Secondary | ICD-10-CM | POA: Diagnosis not present

## 2022-01-31 DIAGNOSIS — M25552 Pain in left hip: Secondary | ICD-10-CM | POA: Diagnosis not present

## 2022-01-31 DIAGNOSIS — R2689 Other abnormalities of gait and mobility: Secondary | ICD-10-CM | POA: Diagnosis not present

## 2022-02-01 DIAGNOSIS — Z79899 Other long term (current) drug therapy: Secondary | ICD-10-CM | POA: Diagnosis not present

## 2022-02-01 DIAGNOSIS — R5383 Other fatigue: Secondary | ICD-10-CM | POA: Diagnosis not present

## 2022-02-01 DIAGNOSIS — I10 Essential (primary) hypertension: Secondary | ICD-10-CM | POA: Diagnosis not present

## 2022-02-01 DIAGNOSIS — E041 Nontoxic single thyroid nodule: Secondary | ICD-10-CM | POA: Diagnosis not present

## 2022-02-01 DIAGNOSIS — E559 Vitamin D deficiency, unspecified: Secondary | ICD-10-CM | POA: Diagnosis not present

## 2022-02-01 DIAGNOSIS — Z299 Encounter for prophylactic measures, unspecified: Secondary | ICD-10-CM | POA: Diagnosis not present

## 2022-02-01 DIAGNOSIS — Z0000 Encounter for general adult medical examination without abnormal findings: Secondary | ICD-10-CM | POA: Diagnosis not present

## 2022-02-01 DIAGNOSIS — Z7189 Other specified counseling: Secondary | ICD-10-CM | POA: Diagnosis not present

## 2022-02-05 DIAGNOSIS — M545 Low back pain, unspecified: Secondary | ICD-10-CM | POA: Diagnosis not present

## 2022-02-05 DIAGNOSIS — R2689 Other abnormalities of gait and mobility: Secondary | ICD-10-CM | POA: Diagnosis not present

## 2022-02-05 DIAGNOSIS — Z7409 Other reduced mobility: Secondary | ICD-10-CM | POA: Diagnosis not present

## 2022-02-05 DIAGNOSIS — M25552 Pain in left hip: Secondary | ICD-10-CM | POA: Diagnosis not present

## 2022-02-05 DIAGNOSIS — R2681 Unsteadiness on feet: Secondary | ICD-10-CM | POA: Diagnosis not present

## 2022-02-07 DIAGNOSIS — Z7409 Other reduced mobility: Secondary | ICD-10-CM | POA: Diagnosis not present

## 2022-02-07 DIAGNOSIS — M25552 Pain in left hip: Secondary | ICD-10-CM | POA: Diagnosis not present

## 2022-02-07 DIAGNOSIS — R2689 Other abnormalities of gait and mobility: Secondary | ICD-10-CM | POA: Diagnosis not present

## 2022-02-07 DIAGNOSIS — R2681 Unsteadiness on feet: Secondary | ICD-10-CM | POA: Diagnosis not present

## 2022-02-07 DIAGNOSIS — M545 Low back pain, unspecified: Secondary | ICD-10-CM | POA: Diagnosis not present

## 2022-02-09 DIAGNOSIS — H6123 Impacted cerumen, bilateral: Secondary | ICD-10-CM | POA: Diagnosis not present

## 2022-02-09 DIAGNOSIS — I10 Essential (primary) hypertension: Secondary | ICD-10-CM | POA: Diagnosis not present

## 2022-02-09 DIAGNOSIS — Z299 Encounter for prophylactic measures, unspecified: Secondary | ICD-10-CM | POA: Diagnosis not present

## 2022-02-09 DIAGNOSIS — N183 Chronic kidney disease, stage 3 unspecified: Secondary | ICD-10-CM | POA: Diagnosis not present

## 2022-02-12 DIAGNOSIS — M545 Low back pain, unspecified: Secondary | ICD-10-CM | POA: Diagnosis not present

## 2022-02-12 DIAGNOSIS — M25552 Pain in left hip: Secondary | ICD-10-CM | POA: Diagnosis not present

## 2022-02-12 DIAGNOSIS — R2681 Unsteadiness on feet: Secondary | ICD-10-CM | POA: Diagnosis not present

## 2022-02-12 DIAGNOSIS — Z7409 Other reduced mobility: Secondary | ICD-10-CM | POA: Diagnosis not present

## 2022-02-12 DIAGNOSIS — R2689 Other abnormalities of gait and mobility: Secondary | ICD-10-CM | POA: Diagnosis not present

## 2022-02-14 DIAGNOSIS — M25552 Pain in left hip: Secondary | ICD-10-CM | POA: Diagnosis not present

## 2022-02-14 DIAGNOSIS — R2689 Other abnormalities of gait and mobility: Secondary | ICD-10-CM | POA: Diagnosis not present

## 2022-02-14 DIAGNOSIS — M545 Low back pain, unspecified: Secondary | ICD-10-CM | POA: Diagnosis not present

## 2022-02-14 DIAGNOSIS — Z7409 Other reduced mobility: Secondary | ICD-10-CM | POA: Diagnosis not present

## 2022-02-14 DIAGNOSIS — R2681 Unsteadiness on feet: Secondary | ICD-10-CM | POA: Diagnosis not present

## 2022-02-15 DIAGNOSIS — M79672 Pain in left foot: Secondary | ICD-10-CM | POA: Diagnosis not present

## 2022-02-15 DIAGNOSIS — M79675 Pain in left toe(s): Secondary | ICD-10-CM | POA: Diagnosis not present

## 2022-02-15 DIAGNOSIS — L110 Acquired keratosis follicularis: Secondary | ICD-10-CM | POA: Diagnosis not present

## 2022-02-15 DIAGNOSIS — M79674 Pain in right toe(s): Secondary | ICD-10-CM | POA: Diagnosis not present

## 2022-02-15 DIAGNOSIS — I739 Peripheral vascular disease, unspecified: Secondary | ICD-10-CM | POA: Diagnosis not present

## 2022-02-15 DIAGNOSIS — M79671 Pain in right foot: Secondary | ICD-10-CM | POA: Diagnosis not present

## 2022-02-20 DIAGNOSIS — M545 Low back pain, unspecified: Secondary | ICD-10-CM | POA: Diagnosis not present

## 2022-02-20 DIAGNOSIS — Z7409 Other reduced mobility: Secondary | ICD-10-CM | POA: Diagnosis not present

## 2022-02-20 DIAGNOSIS — M25552 Pain in left hip: Secondary | ICD-10-CM | POA: Diagnosis not present

## 2022-02-20 DIAGNOSIS — R2689 Other abnormalities of gait and mobility: Secondary | ICD-10-CM | POA: Diagnosis not present

## 2022-02-20 DIAGNOSIS — R2681 Unsteadiness on feet: Secondary | ICD-10-CM | POA: Diagnosis not present

## 2022-02-22 DIAGNOSIS — M545 Low back pain, unspecified: Secondary | ICD-10-CM | POA: Diagnosis not present

## 2022-02-22 DIAGNOSIS — R2681 Unsteadiness on feet: Secondary | ICD-10-CM | POA: Diagnosis not present

## 2022-02-22 DIAGNOSIS — M25552 Pain in left hip: Secondary | ICD-10-CM | POA: Diagnosis not present

## 2022-02-22 DIAGNOSIS — Z7409 Other reduced mobility: Secondary | ICD-10-CM | POA: Diagnosis not present

## 2022-02-22 DIAGNOSIS — R2689 Other abnormalities of gait and mobility: Secondary | ICD-10-CM | POA: Diagnosis not present

## 2022-02-27 DIAGNOSIS — R2689 Other abnormalities of gait and mobility: Secondary | ICD-10-CM | POA: Diagnosis not present

## 2022-02-27 DIAGNOSIS — R2681 Unsteadiness on feet: Secondary | ICD-10-CM | POA: Diagnosis not present

## 2022-02-27 DIAGNOSIS — M545 Low back pain, unspecified: Secondary | ICD-10-CM | POA: Diagnosis not present

## 2022-02-27 DIAGNOSIS — M25552 Pain in left hip: Secondary | ICD-10-CM | POA: Diagnosis not present

## 2022-02-27 DIAGNOSIS — Z7409 Other reduced mobility: Secondary | ICD-10-CM | POA: Diagnosis not present

## 2022-03-05 DIAGNOSIS — L209 Atopic dermatitis, unspecified: Secondary | ICD-10-CM | POA: Diagnosis not present

## 2022-03-05 DIAGNOSIS — Z881 Allergy status to other antibiotic agents status: Secondary | ICD-10-CM | POA: Diagnosis not present

## 2022-03-05 DIAGNOSIS — I10 Essential (primary) hypertension: Secondary | ICD-10-CM | POA: Diagnosis not present

## 2022-03-05 DIAGNOSIS — Z7901 Long term (current) use of anticoagulants: Secondary | ICD-10-CM | POA: Diagnosis not present

## 2022-03-05 DIAGNOSIS — Z86711 Personal history of pulmonary embolism: Secondary | ICD-10-CM | POA: Diagnosis not present

## 2022-03-07 DIAGNOSIS — I779 Disorder of arteries and arterioles, unspecified: Secondary | ICD-10-CM | POA: Diagnosis not present

## 2022-03-07 DIAGNOSIS — I10 Essential (primary) hypertension: Secondary | ICD-10-CM | POA: Diagnosis not present

## 2022-03-07 DIAGNOSIS — Z299 Encounter for prophylactic measures, unspecified: Secondary | ICD-10-CM | POA: Diagnosis not present

## 2022-03-07 DIAGNOSIS — R21 Rash and other nonspecific skin eruption: Secondary | ICD-10-CM | POA: Diagnosis not present

## 2022-03-07 DIAGNOSIS — Z23 Encounter for immunization: Secondary | ICD-10-CM | POA: Diagnosis not present

## 2022-03-20 ENCOUNTER — Ambulatory Visit: Payer: Medicare Other | Admitting: Orthopaedic Surgery

## 2022-05-17 DIAGNOSIS — M79672 Pain in left foot: Secondary | ICD-10-CM | POA: Diagnosis not present

## 2022-05-17 DIAGNOSIS — L110 Acquired keratosis follicularis: Secondary | ICD-10-CM | POA: Diagnosis not present

## 2022-05-17 DIAGNOSIS — M79674 Pain in right toe(s): Secondary | ICD-10-CM | POA: Diagnosis not present

## 2022-05-17 DIAGNOSIS — M79671 Pain in right foot: Secondary | ICD-10-CM | POA: Diagnosis not present

## 2022-05-17 DIAGNOSIS — M79675 Pain in left toe(s): Secondary | ICD-10-CM | POA: Diagnosis not present

## 2022-05-17 DIAGNOSIS — I739 Peripheral vascular disease, unspecified: Secondary | ICD-10-CM | POA: Diagnosis not present

## 2022-05-22 DIAGNOSIS — I2699 Other pulmonary embolism without acute cor pulmonale: Secondary | ICD-10-CM | POA: Diagnosis not present

## 2022-05-22 DIAGNOSIS — Z7901 Long term (current) use of anticoagulants: Secondary | ICD-10-CM | POA: Diagnosis not present

## 2022-05-29 DIAGNOSIS — I2699 Other pulmonary embolism without acute cor pulmonale: Secondary | ICD-10-CM | POA: Diagnosis not present

## 2022-06-01 DIAGNOSIS — Z299 Encounter for prophylactic measures, unspecified: Secondary | ICD-10-CM | POA: Diagnosis not present

## 2022-06-01 DIAGNOSIS — K047 Periapical abscess without sinus: Secondary | ICD-10-CM | POA: Diagnosis not present

## 2022-06-01 DIAGNOSIS — Z789 Other specified health status: Secondary | ICD-10-CM | POA: Diagnosis not present

## 2022-06-01 DIAGNOSIS — I10 Essential (primary) hypertension: Secondary | ICD-10-CM | POA: Diagnosis not present

## 2022-06-01 DIAGNOSIS — E041 Nontoxic single thyroid nodule: Secondary | ICD-10-CM | POA: Diagnosis not present

## 2022-06-08 DIAGNOSIS — Z299 Encounter for prophylactic measures, unspecified: Secondary | ICD-10-CM | POA: Diagnosis not present

## 2022-06-08 DIAGNOSIS — I739 Peripheral vascular disease, unspecified: Secondary | ICD-10-CM | POA: Diagnosis not present

## 2022-06-08 DIAGNOSIS — I700 Atherosclerosis of aorta: Secondary | ICD-10-CM | POA: Diagnosis not present

## 2022-06-08 DIAGNOSIS — N183 Chronic kidney disease, stage 3 unspecified: Secondary | ICD-10-CM | POA: Diagnosis not present

## 2022-06-08 DIAGNOSIS — I10 Essential (primary) hypertension: Secondary | ICD-10-CM | POA: Diagnosis not present

## 2022-06-18 DIAGNOSIS — R059 Cough, unspecified: Secondary | ICD-10-CM | POA: Diagnosis not present

## 2022-06-18 DIAGNOSIS — J029 Acute pharyngitis, unspecified: Secondary | ICD-10-CM | POA: Diagnosis not present

## 2022-06-18 DIAGNOSIS — R5383 Other fatigue: Secondary | ICD-10-CM | POA: Diagnosis not present

## 2022-06-18 DIAGNOSIS — I517 Cardiomegaly: Secondary | ICD-10-CM | POA: Diagnosis not present

## 2022-06-18 DIAGNOSIS — M419 Scoliosis, unspecified: Secondary | ICD-10-CM | POA: Diagnosis not present

## 2022-06-18 DIAGNOSIS — R918 Other nonspecific abnormal finding of lung field: Secondary | ICD-10-CM | POA: Diagnosis not present

## 2022-06-18 DIAGNOSIS — R509 Fever, unspecified: Secondary | ICD-10-CM | POA: Diagnosis not present

## 2022-06-18 DIAGNOSIS — Z299 Encounter for prophylactic measures, unspecified: Secondary | ICD-10-CM | POA: Diagnosis not present

## 2022-06-18 DIAGNOSIS — J069 Acute upper respiratory infection, unspecified: Secondary | ICD-10-CM | POA: Diagnosis not present

## 2022-06-18 DIAGNOSIS — M47814 Spondylosis without myelopathy or radiculopathy, thoracic region: Secondary | ICD-10-CM | POA: Diagnosis not present

## 2022-07-10 ENCOUNTER — Other Ambulatory Visit: Payer: Self-pay | Admitting: Otolaryngology

## 2022-07-10 DIAGNOSIS — E041 Nontoxic single thyroid nodule: Secondary | ICD-10-CM | POA: Insufficient documentation

## 2022-07-16 DIAGNOSIS — H1132 Conjunctival hemorrhage, left eye: Secondary | ICD-10-CM | POA: Diagnosis not present

## 2022-07-16 DIAGNOSIS — Z299 Encounter for prophylactic measures, unspecified: Secondary | ICD-10-CM | POA: Diagnosis not present

## 2022-07-16 DIAGNOSIS — I10 Essential (primary) hypertension: Secondary | ICD-10-CM | POA: Diagnosis not present

## 2022-07-18 ENCOUNTER — Ambulatory Visit
Admission: RE | Admit: 2022-07-18 | Discharge: 2022-07-18 | Disposition: A | Discharge status: Home / Self Care | Payer: 59 | Source: Ambulatory Visit | Attending: Otolaryngology | Admitting: Otolaryngology

## 2022-07-18 DIAGNOSIS — E041 Nontoxic single thyroid nodule: Secondary | ICD-10-CM

## 2022-07-18 DIAGNOSIS — E042 Nontoxic multinodular goiter: Secondary | ICD-10-CM | POA: Diagnosis not present

## 2022-08-23 DIAGNOSIS — M79674 Pain in right toe(s): Secondary | ICD-10-CM | POA: Diagnosis not present

## 2022-08-23 DIAGNOSIS — I739 Peripheral vascular disease, unspecified: Secondary | ICD-10-CM | POA: Diagnosis not present

## 2022-08-23 DIAGNOSIS — M79671 Pain in right foot: Secondary | ICD-10-CM | POA: Diagnosis not present

## 2022-08-23 DIAGNOSIS — L110 Acquired keratosis follicularis: Secondary | ICD-10-CM | POA: Diagnosis not present

## 2022-08-23 DIAGNOSIS — M79675 Pain in left toe(s): Secondary | ICD-10-CM | POA: Diagnosis not present

## 2022-08-23 DIAGNOSIS — M79672 Pain in left foot: Secondary | ICD-10-CM | POA: Diagnosis not present

## 2022-08-31 DIAGNOSIS — Z299 Encounter for prophylactic measures, unspecified: Secondary | ICD-10-CM | POA: Diagnosis not present

## 2022-08-31 DIAGNOSIS — I10 Essential (primary) hypertension: Secondary | ICD-10-CM | POA: Diagnosis not present

## 2022-08-31 DIAGNOSIS — Z0000 Encounter for general adult medical examination without abnormal findings: Secondary | ICD-10-CM | POA: Diagnosis not present

## 2022-08-31 DIAGNOSIS — I779 Disorder of arteries and arterioles, unspecified: Secondary | ICD-10-CM | POA: Diagnosis not present

## 2022-08-31 DIAGNOSIS — I739 Peripheral vascular disease, unspecified: Secondary | ICD-10-CM | POA: Diagnosis not present

## 2022-09-10 DIAGNOSIS — I63039 Cerebral infarction due to thrombosis of unspecified carotid artery: Secondary | ICD-10-CM | POA: Diagnosis not present

## 2022-09-14 DIAGNOSIS — I10 Essential (primary) hypertension: Secondary | ICD-10-CM | POA: Diagnosis not present

## 2022-09-14 DIAGNOSIS — Z299 Encounter for prophylactic measures, unspecified: Secondary | ICD-10-CM | POA: Diagnosis not present

## 2022-09-14 DIAGNOSIS — K219 Gastro-esophageal reflux disease without esophagitis: Secondary | ICD-10-CM | POA: Diagnosis not present

## 2022-10-03 DIAGNOSIS — Z299 Encounter for prophylactic measures, unspecified: Secondary | ICD-10-CM | POA: Diagnosis not present

## 2022-10-03 DIAGNOSIS — R21 Rash and other nonspecific skin eruption: Secondary | ICD-10-CM | POA: Diagnosis not present

## 2022-10-03 DIAGNOSIS — I10 Essential (primary) hypertension: Secondary | ICD-10-CM | POA: Diagnosis not present

## 2022-10-15 DIAGNOSIS — Z79624 Long term (current) use of inhibitors of nucleotide synthesis: Secondary | ICD-10-CM | POA: Diagnosis not present

## 2022-10-15 DIAGNOSIS — Z79899 Other long term (current) drug therapy: Secondary | ICD-10-CM | POA: Diagnosis not present

## 2022-10-15 DIAGNOSIS — E1022 Type 1 diabetes mellitus with diabetic chronic kidney disease: Secondary | ICD-10-CM | POA: Diagnosis not present

## 2022-10-15 DIAGNOSIS — Z7951 Long term (current) use of inhaled steroids: Secondary | ICD-10-CM | POA: Diagnosis not present

## 2022-10-15 DIAGNOSIS — Z7982 Long term (current) use of aspirin: Secondary | ICD-10-CM | POA: Diagnosis not present

## 2022-10-15 DIAGNOSIS — I2510 Atherosclerotic heart disease of native coronary artery without angina pectoris: Secondary | ICD-10-CM | POA: Diagnosis not present

## 2022-10-15 DIAGNOSIS — E10610 Type 1 diabetes mellitus with diabetic neuropathic arthropathy: Secondary | ICD-10-CM | POA: Diagnosis not present

## 2022-10-15 DIAGNOSIS — Z955 Presence of coronary angioplasty implant and graft: Secondary | ICD-10-CM | POA: Diagnosis not present

## 2022-10-15 DIAGNOSIS — T8612 Kidney transplant failure: Secondary | ICD-10-CM | POA: Diagnosis not present

## 2022-10-15 DIAGNOSIS — J4489 Other specified chronic obstructive pulmonary disease: Secondary | ICD-10-CM | POA: Diagnosis not present

## 2022-10-15 DIAGNOSIS — I5022 Chronic systolic (congestive) heart failure: Secondary | ICD-10-CM | POA: Diagnosis not present

## 2022-10-15 DIAGNOSIS — Z8673 Personal history of transient ischemic attack (TIA), and cerebral infarction without residual deficits: Secondary | ICD-10-CM | POA: Diagnosis not present

## 2022-10-15 DIAGNOSIS — Z1231 Encounter for screening mammogram for malignant neoplasm of breast: Secondary | ICD-10-CM | POA: Diagnosis not present

## 2022-10-15 DIAGNOSIS — K219 Gastro-esophageal reflux disease without esophagitis: Secondary | ICD-10-CM | POA: Diagnosis not present

## 2022-10-15 DIAGNOSIS — N1339 Other hydronephrosis: Secondary | ICD-10-CM | POA: Diagnosis not present

## 2022-10-15 DIAGNOSIS — D84821 Immunodeficiency due to drugs: Secondary | ICD-10-CM | POA: Diagnosis not present

## 2022-10-15 DIAGNOSIS — E872 Acidosis, unspecified: Secondary | ICD-10-CM | POA: Diagnosis not present

## 2022-10-15 DIAGNOSIS — Z794 Long term (current) use of insulin: Secondary | ICD-10-CM | POA: Diagnosis not present

## 2022-10-15 DIAGNOSIS — R188 Other ascites: Secondary | ICD-10-CM | POA: Diagnosis not present

## 2022-10-15 DIAGNOSIS — Z86711 Personal history of pulmonary embolism: Secondary | ICD-10-CM | POA: Diagnosis not present

## 2022-10-15 DIAGNOSIS — N186 End stage renal disease: Secondary | ICD-10-CM | POA: Diagnosis not present

## 2022-10-15 DIAGNOSIS — Z91040 Latex allergy status: Secondary | ICD-10-CM | POA: Diagnosis not present

## 2022-10-15 DIAGNOSIS — I152 Hypertension secondary to endocrine disorders: Secondary | ICD-10-CM | POA: Diagnosis not present

## 2022-10-15 DIAGNOSIS — H547 Unspecified visual loss: Secondary | ICD-10-CM | POA: Diagnosis not present

## 2022-10-30 DIAGNOSIS — K0510 Chronic gingivitis, plaque induced: Secondary | ICD-10-CM | POA: Diagnosis not present

## 2022-10-30 DIAGNOSIS — S00522A Blister (nonthermal) of oral cavity, initial encounter: Secondary | ICD-10-CM | POA: Diagnosis not present

## 2022-10-30 DIAGNOSIS — Z299 Encounter for prophylactic measures, unspecified: Secondary | ICD-10-CM | POA: Diagnosis not present

## 2022-10-30 DIAGNOSIS — I10 Essential (primary) hypertension: Secondary | ICD-10-CM | POA: Diagnosis not present

## 2022-11-14 DIAGNOSIS — K219 Gastro-esophageal reflux disease without esophagitis: Secondary | ICD-10-CM | POA: Diagnosis not present

## 2022-11-14 DIAGNOSIS — H6123 Impacted cerumen, bilateral: Secondary | ICD-10-CM | POA: Diagnosis not present

## 2022-11-14 DIAGNOSIS — Z299 Encounter for prophylactic measures, unspecified: Secondary | ICD-10-CM | POA: Diagnosis not present

## 2022-11-14 DIAGNOSIS — I10 Essential (primary) hypertension: Secondary | ICD-10-CM | POA: Diagnosis not present

## 2022-11-19 DIAGNOSIS — M79674 Pain in right toe(s): Secondary | ICD-10-CM | POA: Diagnosis not present

## 2022-11-19 DIAGNOSIS — I739 Peripheral vascular disease, unspecified: Secondary | ICD-10-CM | POA: Diagnosis not present

## 2022-11-19 DIAGNOSIS — L110 Acquired keratosis follicularis: Secondary | ICD-10-CM | POA: Diagnosis not present

## 2022-11-19 DIAGNOSIS — M79671 Pain in right foot: Secondary | ICD-10-CM | POA: Diagnosis not present

## 2022-11-19 DIAGNOSIS — M79672 Pain in left foot: Secondary | ICD-10-CM | POA: Diagnosis not present

## 2022-11-19 DIAGNOSIS — M79675 Pain in left toe(s): Secondary | ICD-10-CM | POA: Diagnosis not present

## 2022-11-21 DIAGNOSIS — Z7901 Long term (current) use of anticoagulants: Secondary | ICD-10-CM | POA: Diagnosis not present

## 2022-11-21 DIAGNOSIS — I2699 Other pulmonary embolism without acute cor pulmonale: Secondary | ICD-10-CM | POA: Diagnosis not present

## 2022-11-21 DIAGNOSIS — R799 Abnormal finding of blood chemistry, unspecified: Secondary | ICD-10-CM | POA: Diagnosis not present

## 2022-11-28 DIAGNOSIS — I2699 Other pulmonary embolism without acute cor pulmonale: Secondary | ICD-10-CM | POA: Diagnosis not present

## 2022-12-18 ENCOUNTER — Other Ambulatory Visit (INDEPENDENT_AMBULATORY_CARE_PROVIDER_SITE_OTHER): Payer: 59

## 2022-12-18 ENCOUNTER — Encounter: Payer: Self-pay | Admitting: Orthopaedic Surgery

## 2022-12-18 ENCOUNTER — Ambulatory Visit (INDEPENDENT_AMBULATORY_CARE_PROVIDER_SITE_OTHER): Payer: 59 | Admitting: Orthopaedic Surgery

## 2022-12-18 VITALS — BP 126/68 | HR 70 | Ht 65.0 in | Wt 180.0 lb

## 2022-12-18 DIAGNOSIS — M5442 Lumbago with sciatica, left side: Secondary | ICD-10-CM | POA: Diagnosis not present

## 2022-12-18 DIAGNOSIS — G8929 Other chronic pain: Secondary | ICD-10-CM

## 2022-12-18 DIAGNOSIS — M25552 Pain in left hip: Secondary | ICD-10-CM

## 2022-12-18 MED ORDER — HYDROCODONE-ACETAMINOPHEN 5-325 MG PO TABS: ORAL_TABLET | ORAL | 0 refills | Status: DC

## 2022-12-18 MED ORDER — CYCLOBENZAPRINE HCL 10 MG PO TABS: 10.0000 mg | ORAL_TABLET | Freq: Every day | ORAL | 0 refills | Status: DC

## 2022-12-18 NOTE — Progress Notes (Signed)
My hip hurts.  She has pain of the left hip that comes from the back and radiates past the left buttock to the left thigh and down the leg just past the knee.  It has gotten worse over the last few weeks.  She has no trauma.  She has known lower back pain.  She is not taking her Flexeril or pain medicine as they ran out.  Spine/Pelvis examination:  Inspection:  Overall, sacoiliac joint benign and hips nontender; without crepitus or defects.   Thoracic spine inspection: Alignment normal without kyphosis present   Lumbar spine inspection:  Alignment  with normal lumbar lordosis, without scoliosis apparent.   Thoracic spine palpation:  without tenderness of spinal processes   Lumbar spine palpation: without tenderness of lumbar area; without tightness of lumbar muscles    Range of Motion:   Lumbar flexion, forward flexion is normal without pain or tenderness    Lumbar extension is full without pain or tenderness   Left lateral bend is normal without pain or tenderness   Right lateral bend is normal without pain or tenderness   Straight leg raising is normal  Strength & tone: normal   Stability overall normal stability  Encounter Diagnoses  Name Primary?   Chronic left-sided low back pain with left-sided sciatica Yes   Chronic left hip pain    X-rays were done of the left hip and lumbar spine, reported separately.  I feel her pain is from her back.  I will resume the Flexeril and pain medicine.  I have reviewed the [place] Controlled Substance Reporting System web site prior to prescribing narcotic medicine for this patient.  She has been going to the [facility] and using the gym.  Hold off on that.  Her pain got worse as she changed machines she was working on.  Return in two weeks.  Call if any problem.  Precautions discussed.  Electronically Signed Darreld Mclean, MD 6/18/202410:17 AM

## 2023-01-01 ENCOUNTER — Ambulatory Visit (INDEPENDENT_AMBULATORY_CARE_PROVIDER_SITE_OTHER): Payer: 59 | Admitting: Orthopaedic Surgery

## 2023-01-01 ENCOUNTER — Encounter: Payer: Self-pay | Admitting: Orthopaedic Surgery

## 2023-01-01 VITALS — BP 144/82 | HR 79 | Ht 65.0 in | Wt 182.0 lb

## 2023-01-01 DIAGNOSIS — Z7901 Long term (current) use of anticoagulants: Secondary | ICD-10-CM | POA: Diagnosis not present

## 2023-01-01 DIAGNOSIS — M5442 Lumbago with sciatica, left side: Secondary | ICD-10-CM

## 2023-01-01 DIAGNOSIS — G8929 Other chronic pain: Secondary | ICD-10-CM

## 2023-01-01 MED ORDER — PREDNISONE 5 MG (21) PO TBPK: ORAL_TABLET | ORAL | 0 refills | Status: DC

## 2023-01-01 NOTE — Progress Notes (Signed)
I could not take the pain medicine.  She had a reaction to the hydrocodone and I will list that as an allergy.  She has chronic pain of the lower back that is worse with activity. She has no numbness, no new trauma.  She is on Eliquis and cannot take NSAIDs.  Spine/Pelvis examination:  Inspection:  Overall, sacoiliac joint benign and hips nontender; without crepitus or defects.   Thoracic spine inspection: Alignment normal without kyphosis present   Lumbar spine inspection:  Alignment  with normal lumbar lordosis, without scoliosis apparent.   Thoracic spine palpation:  without tenderness of spinal processes   Lumbar spine palpation: without tenderness of lumbar area; without tightness of lumbar muscles    Range of Motion:   Lumbar flexion, forward flexion is normal without pain or tenderness    Lumbar extension is full without pain or tenderness   Left lateral bend is normal without pain or tenderness   Right lateral bend is normal without pain or tenderness   Straight leg raising is normal  Strength & tone: normal   Stability overall normal stability  Encounter Diagnoses  Name Primary?   Chronic left-sided low back pain with left-sided sciatica Yes   Chronic anticoagulation    I will give her a prednisone dose pack.  Return in two weeks.  Hold off on gym.  Call if any problem.  Precautions discussed.  Electronically Signed Darreld Mclean, MD 7/2/202410:16 AM

## 2023-01-07 DIAGNOSIS — H1045 Other chronic allergic conjunctivitis: Secondary | ICD-10-CM | POA: Diagnosis not present

## 2023-01-07 DIAGNOSIS — Z961 Presence of intraocular lens: Secondary | ICD-10-CM | POA: Diagnosis not present

## 2023-01-15 ENCOUNTER — Ambulatory Visit (INDEPENDENT_AMBULATORY_CARE_PROVIDER_SITE_OTHER): Payer: 59 | Admitting: Orthopaedic Surgery

## 2023-01-15 ENCOUNTER — Encounter: Payer: Self-pay | Admitting: Orthopaedic Surgery

## 2023-01-15 VITALS — BP 118/76 | HR 82 | Ht 65.0 in | Wt 184.0 lb

## 2023-01-15 DIAGNOSIS — G8929 Other chronic pain: Secondary | ICD-10-CM | POA: Diagnosis not present

## 2023-01-15 DIAGNOSIS — Z7901 Long term (current) use of anticoagulants: Secondary | ICD-10-CM

## 2023-01-15 DIAGNOSIS — M25552 Pain in left hip: Secondary | ICD-10-CM

## 2023-01-15 DIAGNOSIS — M5442 Lumbago with sciatica, left side: Secondary | ICD-10-CM

## 2023-01-15 MED ORDER — TIZANIDINE HCL 2 MG PO TABS: 2.0000 mg | ORAL_TABLET | Freq: Three times a day (TID) | ORAL | 5 refills | Status: DC | PRN

## 2023-01-15 NOTE — Progress Notes (Signed)
I am a little better.  The prednisone dose pack helped a lot.  She has less pain.  She is more active.  She is on Eliquis and cannot take NSAIDs.  Lower back is not tender today.  Muscle tone and strength normal.  Gait normal.  NV intact.  ROM is good.  Encounter Diagnoses  Name Primary?   Chronic left-sided low back pain with left-sided sciatica Yes   Chronic anticoagulation    Chronic left hip pain    I will change to Zanaflex 2.  Stop the Flexeril.  She can return to gym but significantly cut back on exercises.  Return in two months.  Call if any problem.  Precautions discussed.  Electronically Signed Darreld Mclean, MD 7/16/20243:15 PM

## 2023-01-28 DIAGNOSIS — Z299 Encounter for prophylactic measures, unspecified: Secondary | ICD-10-CM | POA: Diagnosis not present

## 2023-01-28 DIAGNOSIS — D6869 Other thrombophilia: Secondary | ICD-10-CM | POA: Diagnosis not present

## 2023-01-28 DIAGNOSIS — J029 Acute pharyngitis, unspecified: Secondary | ICD-10-CM | POA: Diagnosis not present

## 2023-01-28 DIAGNOSIS — D692 Other nonthrombocytopenic purpura: Secondary | ICD-10-CM | POA: Diagnosis not present

## 2023-01-28 DIAGNOSIS — M62838 Other muscle spasm: Secondary | ICD-10-CM | POA: Diagnosis not present

## 2023-01-28 DIAGNOSIS — J020 Streptococcal pharyngitis: Secondary | ICD-10-CM | POA: Diagnosis not present

## 2023-02-04 DIAGNOSIS — Z299 Encounter for prophylactic measures, unspecified: Secondary | ICD-10-CM | POA: Diagnosis not present

## 2023-02-04 DIAGNOSIS — I739 Peripheral vascular disease, unspecified: Secondary | ICD-10-CM | POA: Diagnosis not present

## 2023-02-04 DIAGNOSIS — I779 Disorder of arteries and arterioles, unspecified: Secondary | ICD-10-CM | POA: Diagnosis not present

## 2023-02-04 DIAGNOSIS — N183 Chronic kidney disease, stage 3 unspecified: Secondary | ICD-10-CM | POA: Diagnosis not present

## 2023-02-04 DIAGNOSIS — Z0000 Encounter for general adult medical examination without abnormal findings: Secondary | ICD-10-CM | POA: Diagnosis not present

## 2023-02-04 DIAGNOSIS — M7062 Trochanteric bursitis, left hip: Secondary | ICD-10-CM | POA: Diagnosis not present

## 2023-02-04 DIAGNOSIS — R5383 Other fatigue: Secondary | ICD-10-CM | POA: Diagnosis not present

## 2023-02-04 DIAGNOSIS — Z7189 Other specified counseling: Secondary | ICD-10-CM | POA: Diagnosis not present

## 2023-02-04 DIAGNOSIS — I10 Essential (primary) hypertension: Secondary | ICD-10-CM | POA: Diagnosis not present

## 2023-03-02 ENCOUNTER — Encounter (HOSPITAL_COMMUNITY): Payer: Self-pay

## 2023-03-02 ENCOUNTER — Other Ambulatory Visit: Payer: Self-pay

## 2023-03-02 ENCOUNTER — Emergency Department (HOSPITAL_COMMUNITY): Payer: 59

## 2023-03-02 ENCOUNTER — Emergency Department (HOSPITAL_COMMUNITY)
Admission: EM | Admit: 2023-03-02 | Discharge: 2023-03-02 | Disposition: A | Discharge status: Home / Self Care | Payer: 59 | Attending: Emergency Medicine | Admitting: Emergency Medicine

## 2023-03-02 DIAGNOSIS — M542 Cervicalgia: Secondary | ICD-10-CM | POA: Diagnosis not present

## 2023-03-02 DIAGNOSIS — I6782 Cerebral ischemia: Secondary | ICD-10-CM | POA: Diagnosis not present

## 2023-03-02 DIAGNOSIS — R519 Headache, unspecified: Secondary | ICD-10-CM | POA: Diagnosis not present

## 2023-03-02 DIAGNOSIS — Z7901 Long term (current) use of anticoagulants: Secondary | ICD-10-CM | POA: Insufficient documentation

## 2023-03-02 MED ORDER — HYDROCODONE-ACETAMINOPHEN 5-325 MG PO TABS: 0.5000 | ORAL_TABLET | Freq: Four times a day (QID) | ORAL | 0 refills | Status: DC | PRN

## 2023-03-02 MED ORDER — HYDROCODONE-ACETAMINOPHEN 5-325 MG PO TABS
0.5000 | ORAL_TABLET | Freq: Once | ORAL | Status: AC
  Administered 2023-03-02: 0.5 via ORAL
  Filled 2023-03-02: qty 1

## 2023-03-02 NOTE — ED Provider Notes (Signed)
[place] EMERGENCY DEPARTMENT AT [facility] Provider Note   CSN: [id number] Arrival date & time: 03/02/23  1541     History  Chief Complaint  Patient presents with   Headache    Angela House is a 87 y.o. female.  Patient to the ED with complaint of persistent headache for the past 6 weeks. She describes a sensation "in the middle of my head", behind ears and face. No aggravating factors. She states lying down improves symptoms. She has been seen by her doctor and given antibiotics for sinus infection. She has also been given allergy medication to try to alleviate her symptoms. No fever at any time. She is anticoagulated secondary to PE. No visual changes, hearing changes. She denies dizziness or lightheadedness. No nausea or vomiting. Last night she had onset of left lateral neck and superior shoulder pain that she reports as severe prompting ED visit today. No radiation. No chest pain or SOB.   The history is provided by the patient. No language interpreter was used.  Headache      Home Medications Prior to Admission medications   Medication Sig Start Date End Date Taking? Authorizing Provider  acetaminophen (TYLENOL) 325 MG tablet Take 325 mg by mouth every 6 (six) hours as needed.    [provider]  apixaban (ELIQUIS) 2.5 MG TABS tablet Take 2.5 mg by mouth 2 (two) times daily.    [provider]  ASPERCREME W/LIDOCAINE EX Apply 1 application topically daily as needed (FOR HIP/KNEE PAIN).     [provider]  calcium carbonate (OS-CAL - DOSED IN MG OF ELEMENTAL CALCIUM) 1250 (500 Ca) MG tablet Take 2 tablets by mouth 2 (two) times daily.     [provider]  Carboxymethylcellulose Sodium 0.25 % SOLN Apply 1 drop to eye as needed.    [provider]  cetirizine (ZYRTEC) 10 MG tablet Take 10 mg by mouth daily as needed.    [provider]  clobetasol (TEMOVATE) 0.05 % external solution Apply 1 application  topically as needed. Applied to scalp 06/11/17   [provider]  clobetasol cream (TEMOVATE) 0.05 % Apply 1 application topically daily as needed (FOR RASH).  12/13/15   [provider]  clotrimazole-betamethasone (LOTRISONE) cream as needed.  02/28/19   [provider]  cycloSPORINE (RESTASIS) 0.05 % ophthalmic emulsion Place 1 drop into both eyes 2 (two) times daily.    [provider]  diazepam (VALIUM) 2 MG tablet Take 2 mg by mouth as needed for anxiety.    [provider]  famotidine (PEPCID) 20 MG tablet Take 1 tablet by mouth twice daily 12/17/19   Tiffany Kocher, PA-C  fluticasone [place]) 50 MCG/ACT nasal spray Place 2 sprays into the nose as needed for allergies or rhinitis.    [provider]  furosemide (LASIX) 20 MG tablet Take by mouth as needed.    [provider]  gabapentin (NEURONTIN) 100 MG capsule Take 100 mg by mouth 3 (three) times daily.     [provider]  lisinopril (ZESTRIL) 10 MG tablet Take 2.5 mg by mouth daily.    [provider]  Multiple Vitamin (MULTIVITAMIN) capsule Take 1 capsule by mouth every morning.    [provider]  Polyethyl Glycol-Propyl Glycol (SYSTANE) 0.4-0.3 % SOLN Apply 1 drop to eye 4 (four) times daily.    [provider]  Potassium 99 MG TABS Take 1 tablet by mouth every other day.  [provider]  predniSONE (STERAPRED UNI-PAK 21 TAB) 5 MG (21) TBPK tablet Take 6 pills first day; 5 pills second day; 4 pills third day; 3 pills fourth day; 2 pills next day and 1 pill last day. 01/01/23   Darreld Mclean, MD  tiZANidine (ZANAFLEX) 2 MG tablet Take 1 tablet (2 mg total) by mouth every 8 (eight) hours as needed for muscle spasms. 01/15/23   Darreld Mclean, MD  VENTOLIN HFA [phone] Base) MCG/ACT inhaler Inhale 1-2 puffs into the lungs every 6 (six) hours as needed. 03/24/17   [provider]      Allergies    Ciprofloxacin,  Hydrocodone-acetaminophen, and Meclizine    Review of Systems   Review of Systems  Neurological:  Positive for headaches.    Physical Exam Updated Vital Signs BP 138/71   Pulse 62   Temp 97.9 F (36.6 C) (Oral)   Resp (!) 27   Ht 5\' 5"  (1.651 m)   Wt 84.4 kg   SpO2 93%   BMI 30.95 kg/m  Physical Exam Vitals and nursing note reviewed.  Constitutional:      Appearance: She is well-developed. She is not ill-appearing.  HENT:     Head: Normocephalic and atraumatic.  Neck:     Comments: Tender to poserolateral left neck without swelling or induration. FROM neck and shoulder. No carotid bruit. Pulmonary:     Effort: Pulmonary effort is normal.  Chest:     Chest wall: No tenderness.  Musculoskeletal:        General: Normal range of motion.     Cervical back: Normal range of motion and neck supple.     Comments: No focal tenderness of bony left shoulder. No deformity  Skin:    General: Skin is warm and dry.     Coloration: Skin is not pale.     Findings: No erythema or rash.  Neurological:     Mental Status: She is alert and oriented to person, place, and time.     GCS: GCS eye subscore is 4. GCS verbal subscore is 5. GCS motor subscore is 6.     Cranial Nerves: No cranial nerve deficit.     Sensory: No sensory deficit.     Motor: No weakness.     ED Results / Procedures / Treatments   Labs (all labs ordered are listed, but only abnormal results are displayed) Labs Reviewed - No data to display  EKG None  Radiology CT Head Wo Contrast  Result Date: 03/02/2023 CLINICAL DATA:  Headache, increasing frequency or severity. EXAM: CT HEAD WITHOUT CONTRAST TECHNIQUE: Contiguous axial images were obtained from the base of the skull through the vertex without intravenous contrast. RADIATION DOSE REDUCTION: This exam was performed according to the departmental dose-optimization program which includes automated exposure control, adjustment of the mA and/or kV according to  patient size and/or use of iterative reconstruction technique. COMPARISON:  01/22/2011. FINDINGS: Brain: No acute intracranial hemorrhage, midline shift or mass effect. No extra-axial fluid collection. Diffuse atrophy is noted. Mild periventricular white matter hypodensities are present bilaterally. No hydrocephalus. Vascular: No hyperdense vessel or unexpected calcification. Skull: Normal. Negative for fracture or focal lesion. Sinuses/Orbits: No acute finding. Other: None. IMPRESSION: 1. No acute intracranial process. 2. Atrophy with chronic microvascular ischemic changes. Electronically Signed   By: Thornell Sartorius M.D.   On: 03/02/2023 21:53    Procedures Procedures    Medications Ordered in ED Medications  HYDROcodone-acetaminophen (NORCO/VICODIN) 5-325 MG per tablet 0.5  tablet (has no administration in time range)    ED Course/ Medical Decision Making/ A&P Clinical Course as of 03/02/23 2246  Sat Mar 02, 2023  2122 Patient to ED with persistent headache x 6 weeks, now with left sided neck and shoulder pain. She is in tears while explaining her worry over her symptoms and that she is not better with any medication prescribed. She is anticoagulated. Consider CT imaging which would evaluated for IC abnormality as well as sinus inflammation.  [SU]  2242 CT head negative for intracranial abnormality. On recheck, the patient is seen ambulating, is steady, in NAD. VSS.  [SU]  2242 Feel the shoulder and neck pain are MSK in nature, unrelated to the 6 week span of headache symptoms.  [SU]    Clinical Course User Index [SU] Elpidio Anis, PA-C                                 Medical Decision Making Amount and/or Complexity of Data Reviewed Radiology: ordered.  Risk Prescription drug management.           Final Clinical Impression(s) / ED Diagnoses Final diagnoses:  Sinus headache  Musculoskeletal neck pain    Rx / DC Orders ED Discharge Orders     None          Danne Harbor 03/02/23 2247    Vanetta Mulders, MD 03/03/23 [phone]

## 2023-03-02 NOTE — ED Notes (Signed)
Patient transported to CT 

## 2023-03-02 NOTE — ED Triage Notes (Signed)
Headache x 1.5 months Nose and ear clicking and pain LEFT side of neck pain and LEFT shoulder pain that increased last night.

## 2023-03-02 NOTE — Discharge Instructions (Signed)
Continue using the antihistamine (allergy medication) daily and your nasal spray. The symptoms you're having are likely related to inflamed sinuses. Plan to follow up with your doctor in 1 week for recheck.   Take 1/2 norco for pain in the neck and shoulder as needed, every 6 hours. Continue warm compresses to this area.

## 2023-03-11 DIAGNOSIS — Z299 Encounter for prophylactic measures, unspecified: Secondary | ICD-10-CM | POA: Diagnosis not present

## 2023-03-11 DIAGNOSIS — I10 Essential (primary) hypertension: Secondary | ICD-10-CM | POA: Diagnosis not present

## 2023-03-11 DIAGNOSIS — T7840XA Allergy, unspecified, initial encounter: Secondary | ICD-10-CM | POA: Diagnosis not present

## 2023-03-11 DIAGNOSIS — M542 Cervicalgia: Secondary | ICD-10-CM | POA: Diagnosis not present

## 2023-03-14 DIAGNOSIS — I6529 Occlusion and stenosis of unspecified carotid artery: Secondary | ICD-10-CM | POA: Diagnosis not present

## 2023-03-14 DIAGNOSIS — M542 Cervicalgia: Secondary | ICD-10-CM | POA: Diagnosis not present

## 2023-03-18 DIAGNOSIS — Z299 Encounter for prophylactic measures, unspecified: Secondary | ICD-10-CM | POA: Diagnosis not present

## 2023-03-18 DIAGNOSIS — J029 Acute pharyngitis, unspecified: Secondary | ICD-10-CM | POA: Diagnosis not present

## 2023-03-18 DIAGNOSIS — I10 Essential (primary) hypertension: Secondary | ICD-10-CM | POA: Diagnosis not present

## 2023-03-18 DIAGNOSIS — Z23 Encounter for immunization: Secondary | ICD-10-CM | POA: Diagnosis not present

## 2023-03-18 DIAGNOSIS — M542 Cervicalgia: Secondary | ICD-10-CM | POA: Diagnosis not present

## 2023-03-19 ENCOUNTER — Encounter: Payer: Self-pay | Admitting: Orthopaedic Surgery

## 2023-03-19 ENCOUNTER — Ambulatory Visit (INDEPENDENT_AMBULATORY_CARE_PROVIDER_SITE_OTHER): Payer: 59 | Admitting: Orthopaedic Surgery

## 2023-03-19 DIAGNOSIS — M542 Cervicalgia: Secondary | ICD-10-CM | POA: Diagnosis not present

## 2023-03-19 DIAGNOSIS — Z7901 Long term (current) use of anticoagulants: Secondary | ICD-10-CM | POA: Diagnosis not present

## 2023-03-19 NOTE — Progress Notes (Signed)
My neck hurts  She has had more left neck pain over the last month.  She saw her family doctor and he had her take the Zanaflex two at night.  She is concerned about the dosage.  It helps.  She has less pain.  She has no new trauma.  She has Zanaflex 4.  I told her she could cut the pill in half and take it during the day and take a whole one at night.  She will do this.  She is somewhat better.  She has no paresthesias or swelling.  ROM of the neck is full today.  NV intact.  Encounter Diagnoses  Name Primary?   Neck pain Yes   Chronic anticoagulation    As above.  Return in one month.  Call if any problem.  Precautions discussed.  Electronically Signed Darreld Mclean, MD 9/17/20249:58 AM

## 2023-04-05 LAB — AMB RESULTS CONSOLE CBG: Glucose: 81

## 2023-04-05 NOTE — Progress Notes (Signed)
SDOH declined

## 2023-04-16 ENCOUNTER — Ambulatory Visit: Payer: 59 | Admitting: Orthopaedic Surgery

## 2023-04-17 DIAGNOSIS — I700 Atherosclerosis of aorta: Secondary | ICD-10-CM | POA: Diagnosis not present

## 2023-04-17 DIAGNOSIS — I10 Essential (primary) hypertension: Secondary | ICD-10-CM | POA: Diagnosis not present

## 2023-04-17 DIAGNOSIS — N183 Chronic kidney disease, stage 3 unspecified: Secondary | ICD-10-CM | POA: Diagnosis not present

## 2023-04-17 DIAGNOSIS — Z299 Encounter for prophylactic measures, unspecified: Secondary | ICD-10-CM | POA: Diagnosis not present

## 2023-04-17 DIAGNOSIS — I779 Disorder of arteries and arterioles, unspecified: Secondary | ICD-10-CM | POA: Diagnosis not present

## 2023-04-17 DIAGNOSIS — H9212 Otorrhea, left ear: Secondary | ICD-10-CM | POA: Diagnosis not present

## 2023-04-23 ENCOUNTER — Ambulatory Visit (INDEPENDENT_AMBULATORY_CARE_PROVIDER_SITE_OTHER): Payer: 59 | Admitting: Orthopaedic Surgery

## 2023-04-23 ENCOUNTER — Encounter: Payer: Self-pay | Admitting: Orthopaedic Surgery

## 2023-04-23 VITALS — BP 124/73 | HR 73 | Ht 65.0 in | Wt 184.0 lb

## 2023-04-23 DIAGNOSIS — Z7901 Long term (current) use of anticoagulants: Secondary | ICD-10-CM | POA: Diagnosis not present

## 2023-04-23 DIAGNOSIS — M542 Cervicalgia: Secondary | ICD-10-CM

## 2023-04-23 MED ORDER — TIZANIDINE HCL 2 MG PO TABS: 2.0000 mg | ORAL_TABLET | Freq: Three times a day (TID) | ORAL | 5 refills | Status: DC | PRN

## 2023-04-23 NOTE — Progress Notes (Signed)
My neck is better.  She has less neck pain and more motion.  She needs a refill on her Zanaflex 2.  She has no numbness.  ROM of neck is full.  NV intact.  Grips normal.  Encounter Diagnoses  Name Primary?   Neck pain Yes   Chronic anticoagulation    I will renew the Zanaflex.  Return in six weeks.  Call if any problem.  Precautions discussed.  Electronically Signed Darreld Mclean, MD 10/22/20241:33 PM

## 2023-04-25 ENCOUNTER — Encounter (INDEPENDENT_AMBULATORY_CARE_PROVIDER_SITE_OTHER): Payer: Self-pay

## 2023-04-25 ENCOUNTER — Ambulatory Visit (INDEPENDENT_AMBULATORY_CARE_PROVIDER_SITE_OTHER): Payer: 59 | Admitting: Audiology

## 2023-04-25 ENCOUNTER — Ambulatory Visit (INDEPENDENT_AMBULATORY_CARE_PROVIDER_SITE_OTHER): Payer: 59 | Admitting: Otolaryngology

## 2023-04-25 VITALS — Ht 65.0 in | Wt 184.0 lb

## 2023-04-25 DIAGNOSIS — E041 Nontoxic single thyroid nodule: Secondary | ICD-10-CM

## 2023-04-25 DIAGNOSIS — H9202 Otalgia, left ear: Secondary | ICD-10-CM

## 2023-04-25 DIAGNOSIS — H903 Sensorineural hearing loss, bilateral: Secondary | ICD-10-CM | POA: Diagnosis not present

## 2023-04-25 DIAGNOSIS — H9313 Tinnitus, bilateral: Secondary | ICD-10-CM | POA: Diagnosis not present

## 2023-04-25 DIAGNOSIS — H6121 Impacted cerumen, right ear: Secondary | ICD-10-CM | POA: Diagnosis not present

## 2023-04-26 NOTE — Progress Notes (Signed)
[address] [place], [place] [address] [phone]  Audiological Evaluation   Name: Angela House     DOB:   1935-09-02      MRN:   [id number]                                                                                     Service Date: 04/26/2023        Patient comes today after Dr. Suszanne Conners, ENT sent a referral for a hearing evaluation due to concerns with pain behind her left ear.   Symptoms Yes Details  Hearing loss  []  Not perceived, per patient  Tinnitus  [x]  Both ears/middle of her head- bothersome during the day  Ear pain/ Ear infections  []    Balance problems  [x]  Rarely off when she turns  Noise exposure  [x]  Worked at [employer] for one year many years ago  Previous ear surgeries  []    Family history  []    Amplification  []    Other  []       Tympanometry: Right ear: Type A- Normal external ear canal volume with normal middle ear pressure and tympanic membrane compliance Left ear: Type A- Normal external ear canal volume with normal middle ear pressure and tympanic membrane compliance  Pure tone Audiometry: Right ear- Normal hearing from [phone] Hz, then mild to moderate sensorineural hearing loss from 4000-8000 Hz. Left ear-  Normal hearing from [phone] Hz, then mild to moderatesensorineural hearing loss from 4000 Hz - 8000 Hz.  The hearing test results were completed under headphones and results are deemed to be of good reliability. Test technique:  conventional     Speech Audiometry: Right ear- Speech Reception Threshold (SRT) was obtained at 10 dBHL Left ear-Speech Reception Threshold (SRT) was obtained at 15 dBHL   Word Recognition Score Tested using NU-6 (MLV) Right ear: 96% was obtained at a presentation level of 60 dBHL with contralateral masking which is deemed as  excellent Left ear: 96% was obtained at a presentation level of 60 dBHL with contralateral masking which is deemed as  excellent    Impression: There is not a  significant difference in pure-tone thresholds between ears.  There is not a significant difference in the word recognition score in between ears.    Recommendations: Follow up with ENT as scheduled for today.  Consider various tinnitus strategies, including the use of a sound generator, hearing aids, and/or tinnitus retraining therapy.  Consider a communication needs assessment after medical clearance for hearing aids is obtained, pending patient interest.   Genavieve Mangiapane MARIE LEROUX-MARTINEZ, AUD

## 2023-04-29 DIAGNOSIS — M79674 Pain in right toe(s): Secondary | ICD-10-CM | POA: Diagnosis not present

## 2023-04-29 DIAGNOSIS — M79672 Pain in left foot: Secondary | ICD-10-CM | POA: Diagnosis not present

## 2023-04-29 DIAGNOSIS — H903 Sensorineural hearing loss, bilateral: Secondary | ICD-10-CM | POA: Insufficient documentation

## 2023-04-29 DIAGNOSIS — H9313 Tinnitus, bilateral: Secondary | ICD-10-CM | POA: Insufficient documentation

## 2023-04-29 DIAGNOSIS — L110 Acquired keratosis follicularis: Secondary | ICD-10-CM | POA: Diagnosis not present

## 2023-04-29 DIAGNOSIS — H6121 Impacted cerumen, right ear: Secondary | ICD-10-CM | POA: Insufficient documentation

## 2023-04-29 DIAGNOSIS — I739 Peripheral vascular disease, unspecified: Secondary | ICD-10-CM | POA: Diagnosis not present

## 2023-04-29 DIAGNOSIS — M79675 Pain in left toe(s): Secondary | ICD-10-CM | POA: Diagnosis not present

## 2023-04-29 DIAGNOSIS — M79671 Pain in right foot: Secondary | ICD-10-CM | POA: Diagnosis not present

## 2023-04-29 DIAGNOSIS — H9202 Otalgia, left ear: Secondary | ICD-10-CM | POA: Insufficient documentation

## 2023-04-29 NOTE — Progress Notes (Signed)
Patient ID: Angela House, female   DOB: [birth year], 87 y.o.   MRN: [id number]  Follow-up: Left thyroid nodule New complaint: Left ear pain, bilateral tinnitus  HPI: The patient is an 87 year old female who returns today for follow-up of her left thyroid nodule.  The patient was previously noted to have a 1.6 cm left thyroid nodule.  She underwent ultrasound-guided fine-needle aspiration biopsy of the nodule.  The pathology was consistent with benign follicular nodule.  The patient has been asymptomatic in regards to the thyroid nodule.  She presents today with multiple new complaints, including recurrent left ear pain.  The pain localizes to the left mastoid area, radiating down her neck.  Her recent head CT scan was negative for any anatomic abnormality.  She was placed on tizanidine, a muscle relaxant.  In addition, she also complains of frequent bilateral tinnitus.  She describes the tinnitus as a clicking sound.  She denies any recent change in her hearing.  She also denies any otorrhea or vertigo.  Exam: General: Communicates without difficulty, well nourished, no acute distress. Head: Normocephalic, no evidence injury, no tenderness, facial buttresses intact without stepoff. Face/sinus: No tenderness to palpation and percussion. Facial movement is normal and symmetric. Eyes: PERRL, EOMI. No scleral icterus, conjunctivae clear. Neuro: CN II exam reveals vision grossly intact.  No nystagmus at any point of gaze. Ears: Auricles well formed without lesions.  Right ear cerumen impaction.  Nose: External evaluation reveals normal support and skin without lesions.  Dorsum is intact.  Anterior rhinoscopy reveals congested mucosa over anterior aspect of inferior turbinates and intact septum.  No purulence noted. Oral:  Oral cavity and oropharynx are intact, symmetric, without erythema or edema.  Mucosa is moist without lesions. Neck: Full range of motion without pain.  There is no significant lymphadenopathy.   No masses palpable.  Thyroid bed within normal limits to palpation.  Parotid glands and submandibular glands equal bilaterally without mass.  Trachea is midline. Neuro:  CN 2-12 grossly intact.    Procedure: Right ear cerumen disimpaction Anesthesia: None Description: Under the operating microscope, the cerumen is carefully removed with a combination of cerumen currette, alligator forceps, and suction catheters.  After the cerumen is removed, the TMs are noted to be normal.  No mass, erythema, or lesions. The patient tolerated the procedure well.    Her hearing test shows bilateral high-frequency sensorineural hearing loss.  Assessment: 1.  Asymptomatic left thyroid nodule.  Her previous FNA was negative. 2.  Referred left otalgia of unknown etiology.  Her ear canals, tympanic membranes, and middle ear spaces are all normal.  This may be secondary to musculoskeletal causes. 3.  Bilateral symmetric high-frequency sensorineural hearing loss, likely secondary to routine presbycusis. 4.  Bilateral subjective tinnitus. 5.  Right ear cerumen impaction.  Plan: 1.  Otomicroscopy with right ear cerumen disimpaction. 2.  The physical exam findings are reviewed with the patient.  She is reassured that no otologic abnormality is noted today.  Her recent head CT scan images are also reviewed with the patient. 3.  The strategies to cope with tinnitus, including the use of masker, hearing aids, tinnitus retraining therapy, and avoidance of caffeine and alcohol are discussed.  4.  Continue the use of muscle relaxant and Tylenol to treat the referred otalgia.

## 2023-04-30 ENCOUNTER — Encounter: Payer: Self-pay | Admitting: *Deleted

## 2023-04-30 NOTE — Progress Notes (Unsigned)
Pt attended 04/05/23 screening event where her b/p was 168/84 and hr blood sugar was 81. At the event, the pt did not identify and SDOH insecurities; and the she noted her PCP was Dr. Kirstie Peri from the [facility], whose documentation is not visible in [facility]. However, pt's visible encounter with ortho specialists and 03/14/23 cervical radiology tests confirm pt saw Dr. Sherryll Burger most recently in 9/24. At pt's ortho appt on 04/23/23, her b/p was 124/73.

## 2023-05-09 DIAGNOSIS — Z299 Encounter for prophylactic measures, unspecified: Secondary | ICD-10-CM | POA: Diagnosis not present

## 2023-05-09 DIAGNOSIS — H9209 Otalgia, unspecified ear: Secondary | ICD-10-CM | POA: Diagnosis not present

## 2023-05-09 DIAGNOSIS — I10 Essential (primary) hypertension: Secondary | ICD-10-CM | POA: Diagnosis not present

## 2023-05-09 DIAGNOSIS — M542 Cervicalgia: Secondary | ICD-10-CM | POA: Diagnosis not present

## 2023-05-09 DIAGNOSIS — R609 Edema, unspecified: Secondary | ICD-10-CM | POA: Diagnosis not present

## 2023-05-13 DIAGNOSIS — I808 Phlebitis and thrombophlebitis of other sites: Secondary | ICD-10-CM | POA: Diagnosis not present

## 2023-05-15 ENCOUNTER — Institutional Professional Consult (permissible substitution) (INDEPENDENT_AMBULATORY_CARE_PROVIDER_SITE_OTHER): Payer: 59

## 2023-05-20 DIAGNOSIS — R609 Edema, unspecified: Secondary | ICD-10-CM | POA: Diagnosis not present

## 2023-05-20 DIAGNOSIS — I10 Essential (primary) hypertension: Secondary | ICD-10-CM | POA: Diagnosis not present

## 2023-05-20 DIAGNOSIS — I739 Peripheral vascular disease, unspecified: Secondary | ICD-10-CM | POA: Diagnosis not present

## 2023-05-20 DIAGNOSIS — Z299 Encounter for prophylactic measures, unspecified: Secondary | ICD-10-CM | POA: Diagnosis not present

## 2023-05-20 DIAGNOSIS — N183 Chronic kidney disease, stage 3 unspecified: Secondary | ICD-10-CM | POA: Diagnosis not present

## 2023-05-28 DIAGNOSIS — R609 Edema, unspecified: Secondary | ICD-10-CM | POA: Diagnosis not present

## 2023-05-28 DIAGNOSIS — M542 Cervicalgia: Secondary | ICD-10-CM | POA: Diagnosis not present

## 2023-05-28 DIAGNOSIS — I10 Essential (primary) hypertension: Secondary | ICD-10-CM | POA: Diagnosis not present

## 2023-05-28 DIAGNOSIS — Z299 Encounter for prophylactic measures, unspecified: Secondary | ICD-10-CM | POA: Diagnosis not present

## 2023-06-03 DIAGNOSIS — I2699 Other pulmonary embolism without acute cor pulmonale: Secondary | ICD-10-CM | POA: Diagnosis not present

## 2023-06-04 ENCOUNTER — Ambulatory Visit: Payer: 59 | Admitting: Orthopaedic Surgery

## 2023-06-05 ENCOUNTER — Encounter: Payer: Self-pay | Admitting: Orthopaedic Surgery

## 2023-06-05 ENCOUNTER — Ambulatory Visit: Payer: 59 | Admitting: Orthopaedic Surgery

## 2023-06-05 VITALS — BP 94/63 | HR 63 | Ht 65.0 in | Wt 185.2 lb

## 2023-06-05 DIAGNOSIS — Z7901 Long term (current) use of anticoagulants: Secondary | ICD-10-CM | POA: Diagnosis not present

## 2023-06-05 DIAGNOSIS — M542 Cervicalgia: Secondary | ICD-10-CM

## 2023-06-05 NOTE — Progress Notes (Signed)
My neck is a little better.  She has less neck pain but had more pain today with the 19 degree weather.  She is seeing a chiropractor also.  She is taking the Zanaflex and it helps.  She has no numbness.  ROM of the neck is full.  NV intact.  Encounter Diagnoses  Name Primary?   Neck pain Yes   Chronic anticoagulation    Return in two months.  Continue the Zanaflex.  Call if any problem.  Precautions discussed.  Electronically Signed Darreld Mclean, MD 12/4/20242:12 PM

## 2023-06-10 DIAGNOSIS — M542 Cervicalgia: Secondary | ICD-10-CM | POA: Diagnosis not present

## 2023-06-10 DIAGNOSIS — N183 Chronic kidney disease, stage 3 unspecified: Secondary | ICD-10-CM | POA: Diagnosis not present

## 2023-06-10 DIAGNOSIS — I10 Essential (primary) hypertension: Secondary | ICD-10-CM | POA: Diagnosis not present

## 2023-06-10 DIAGNOSIS — R609 Edema, unspecified: Secondary | ICD-10-CM | POA: Diagnosis not present

## 2023-06-10 DIAGNOSIS — Z299 Encounter for prophylactic measures, unspecified: Secondary | ICD-10-CM | POA: Diagnosis not present

## 2023-06-10 DIAGNOSIS — R52 Pain, unspecified: Secondary | ICD-10-CM | POA: Diagnosis not present

## 2023-08-05 DIAGNOSIS — M79675 Pain in left toe(s): Secondary | ICD-10-CM | POA: Diagnosis not present

## 2023-08-05 DIAGNOSIS — M79672 Pain in left foot: Secondary | ICD-10-CM | POA: Diagnosis not present

## 2023-08-05 DIAGNOSIS — M79674 Pain in right toe(s): Secondary | ICD-10-CM | POA: Diagnosis not present

## 2023-08-05 DIAGNOSIS — L110 Acquired keratosis follicularis: Secondary | ICD-10-CM | POA: Diagnosis not present

## 2023-08-05 DIAGNOSIS — I739 Peripheral vascular disease, unspecified: Secondary | ICD-10-CM | POA: Diagnosis not present

## 2023-08-05 DIAGNOSIS — M79671 Pain in right foot: Secondary | ICD-10-CM | POA: Diagnosis not present

## 2023-08-07 ENCOUNTER — Ambulatory Visit: Payer: 59 | Admitting: Orthopaedic Surgery

## 2023-08-21 ENCOUNTER — Ambulatory Visit: Payer: 59 | Admitting: Orthopaedic Surgery

## 2023-08-28 ENCOUNTER — Ambulatory Visit: Payer: 59 | Admitting: Orthopaedic Surgery

## 2023-08-28 ENCOUNTER — Encounter: Payer: Self-pay | Admitting: Orthopaedic Surgery

## 2023-08-28 VITALS — BP 133/71 | HR 80 | Ht 65.0 in | Wt 184.2 lb

## 2023-08-28 DIAGNOSIS — M542 Cervicalgia: Secondary | ICD-10-CM | POA: Diagnosis not present

## 2023-08-28 DIAGNOSIS — M65331 Trigger finger, right middle finger: Secondary | ICD-10-CM | POA: Diagnosis not present

## 2023-08-28 DIAGNOSIS — Z7901 Long term (current) use of anticoagulants: Secondary | ICD-10-CM | POA: Diagnosis not present

## 2023-08-28 MED ORDER — TIZANIDINE HCL 2 MG PO TABS: 2.0000 mg | ORAL_TABLET | Freq: Three times a day (TID) | ORAL | 1 refills | Status: AC | PRN

## 2023-08-28 NOTE — Progress Notes (Signed)
 My neck is sore at times.  She has recurrent neck pain on the left side.  She is seeing a chiropractor for this also.  She is better today but had a bed episode about two weeks ago.  She has no numbness no swelling, no redness.  She is taking an occasional Zanaflex 2 which helps.  ROM of the neck today is good.  NV intact.  She says she has had triggering of the right dominant long finger but I cannot get it to lock today.  She has good and bad days with this also.  She is to call if this gets worse.  NV intact.  Encounter Diagnoses  Name Primary?   Neck pain Yes   Chronic anticoagulation    Trigger finger, right middle finger    I have refilled the Zanaflex.  Continue her exercises.  Call if finger locks more.  Return in two months.  Call if any problem.  Precautions discussed.  Electronically Signed Darreld Mclean, MD 2/26/20252:08 PM

## 2023-09-02 DIAGNOSIS — Z299 Encounter for prophylactic measures, unspecified: Secondary | ICD-10-CM | POA: Diagnosis not present

## 2023-09-02 DIAGNOSIS — I10 Essential (primary) hypertension: Secondary | ICD-10-CM | POA: Diagnosis not present

## 2023-09-02 DIAGNOSIS — Z0000 Encounter for general adult medical examination without abnormal findings: Secondary | ICD-10-CM | POA: Diagnosis not present

## 2023-09-02 DIAGNOSIS — N183 Chronic kidney disease, stage 3 unspecified: Secondary | ICD-10-CM | POA: Diagnosis not present

## 2023-09-02 DIAGNOSIS — R52 Pain, unspecified: Secondary | ICD-10-CM | POA: Diagnosis not present

## 2023-10-17 DIAGNOSIS — Z1231 Encounter for screening mammogram for malignant neoplasm of breast: Secondary | ICD-10-CM | POA: Diagnosis not present

## 2023-10-23 ENCOUNTER — Ambulatory Visit: Payer: 59 | Admitting: Orthopaedic Surgery

## 2023-10-24 ENCOUNTER — Ambulatory Visit: Admitting: Orthopaedic Surgery

## 2023-10-24 ENCOUNTER — Encounter: Payer: Self-pay | Admitting: Orthopaedic Surgery

## 2023-10-24 VITALS — Ht 65.0 in | Wt 183.6 lb

## 2023-10-24 DIAGNOSIS — M5442 Lumbago with sciatica, left side: Secondary | ICD-10-CM | POA: Diagnosis not present

## 2023-10-24 DIAGNOSIS — G8929 Other chronic pain: Secondary | ICD-10-CM | POA: Diagnosis not present

## 2023-10-24 DIAGNOSIS — Z7901 Long term (current) use of anticoagulants: Secondary | ICD-10-CM | POA: Diagnosis not present

## 2023-10-24 DIAGNOSIS — M65331 Trigger finger, right middle finger: Secondary | ICD-10-CM

## 2023-10-24 NOTE — Progress Notes (Signed)
 My left hip hurts some.  She has lower back pain with some left sided sciatica at times.  It hurts her hip and she has had trochanteric bursitis there also.  She has no weakness or new trauma.  Her right trigger finger is much improved of the ring finger.  ROM of the back is good.  Muscle tone and strength are normal.  NV intact.  She is slightly tender over the left trochanteric bursa area, ROM of the hips are good.  Right ring finger is not triggering and has full motion today.  Encounter Diagnoses  Name Primary?   Trigger finger, right middle finger Yes   Chronic left-sided low back pain with left-sided sciatica    Chronic anticoagulation    Continue her present exercises for the back.  She cannot take NSAIDs as she is on Eliquis .    Return in six weeks.  Call if any problem.  Precautions discussed.  Electronically Signed Pleasant Brilliant, MD 4/24/20258:51 AM

## 2023-10-25 DIAGNOSIS — I10 Essential (primary) hypertension: Secondary | ICD-10-CM | POA: Diagnosis not present

## 2023-10-25 DIAGNOSIS — J019 Acute sinusitis, unspecified: Secondary | ICD-10-CM | POA: Diagnosis not present

## 2023-10-25 DIAGNOSIS — Z299 Encounter for prophylactic measures, unspecified: Secondary | ICD-10-CM | POA: Diagnosis not present

## 2023-10-25 DIAGNOSIS — R059 Cough, unspecified: Secondary | ICD-10-CM | POA: Diagnosis not present

## 2023-10-25 DIAGNOSIS — N183 Chronic kidney disease, stage 3 unspecified: Secondary | ICD-10-CM | POA: Diagnosis not present

## 2023-10-26 LAB — AMB RESULTS CONSOLE CBG: Glucose: 90

## 2023-10-29 DIAGNOSIS — R208 Other disturbances of skin sensation: Secondary | ICD-10-CM | POA: Diagnosis not present

## 2023-10-29 DIAGNOSIS — L910 Hypertrophic scar: Secondary | ICD-10-CM | POA: Diagnosis not present

## 2023-10-29 NOTE — Progress Notes (Signed)
 Pt came to mobile screening at [facility] in [place]. Blood pressure elevated. No sdoh needs indicated.

## 2023-11-11 DIAGNOSIS — L110 Acquired keratosis follicularis: Secondary | ICD-10-CM | POA: Diagnosis not present

## 2023-11-11 DIAGNOSIS — M79675 Pain in left toe(s): Secondary | ICD-10-CM | POA: Diagnosis not present

## 2023-11-11 DIAGNOSIS — I739 Peripheral vascular disease, unspecified: Secondary | ICD-10-CM | POA: Diagnosis not present

## 2023-11-11 DIAGNOSIS — M79671 Pain in right foot: Secondary | ICD-10-CM | POA: Diagnosis not present

## 2023-11-11 DIAGNOSIS — M79672 Pain in left foot: Secondary | ICD-10-CM | POA: Diagnosis not present

## 2023-11-11 DIAGNOSIS — M79674 Pain in right toe(s): Secondary | ICD-10-CM | POA: Diagnosis not present

## 2023-12-02 DIAGNOSIS — L910 Hypertrophic scar: Secondary | ICD-10-CM | POA: Diagnosis not present

## 2023-12-02 DIAGNOSIS — I2699 Other pulmonary embolism without acute cor pulmonale: Secondary | ICD-10-CM | POA: Diagnosis not present

## 2023-12-05 ENCOUNTER — Ambulatory Visit: Admitting: Orthopaedic Surgery

## 2023-12-05 ENCOUNTER — Encounter: Payer: Self-pay | Admitting: Orthopaedic Surgery

## 2023-12-05 DIAGNOSIS — M545 Low back pain, unspecified: Secondary | ICD-10-CM | POA: Diagnosis not present

## 2023-12-05 DIAGNOSIS — Z7901 Long term (current) use of anticoagulants: Secondary | ICD-10-CM

## 2023-12-05 DIAGNOSIS — M79604 Pain in right leg: Secondary | ICD-10-CM

## 2023-12-05 MED ORDER — HYDROCODONE-ACETAMINOPHEN 5-325 MG PO TABS
ORAL_TABLET | ORAL | 0 refills | Status: AC
Start: 2023-12-05 — End: ?

## 2023-12-05 MED ORDER — PREDNISONE 5 MG (21) PO TBPK: ORAL_TABLET | ORAL | 0 refills | Status: AC

## 2023-12-05 NOTE — Progress Notes (Signed)
 My right leg is hurting now.  She has radiating pain down the right leg from the lower back.  She has no new trauma.  She has no weakness, no redness.  It began about a month ago and has not changed.  She is on Eliquis  and cannot take NSAIDs.  She has left trochanteric bursitis that comes and goes.  Spine/Pelvis examination:  Inspection:  Overall, sacoiliac joint benign and hips nontender; without crepitus or defects.   Thoracic spine inspection: Alignment normal without kyphosis present   Lumbar spine inspection:  Alignment  with normal lumbar lordosis, without scoliosis apparent.   Thoracic spine palpation:  without tenderness of spinal processes   Lumbar spine palpation: with tenderness of lumbar area; without tightness of lumbar muscles    Range of Motion:   Lumbar flexion, forward flexion is 30 without pain or tenderness    Lumbar extension is 10 without pain or tenderness   Left lateral bend is Normal  without pain or tenderness   Right lateral bend is Normal without pain or tenderness   Straight leg raising is Normal   Strength & tone: Normal   Stability overall normal stability    Encounter Diagnoses  Name Primary?   Lumbar pain with radiation down right leg Yes   Chronic anticoagulation    I will begin prednisone  dose pack.  I have reviewed the [place]  Controlled Substance Reporting System web site prior to prescribing narcotic medicine for this patient.  Return in two weeks.  Call if any problem.  Precautions discussed.  Electronically Signed Pleasant Brilliant, MD 6/5/20259:28 AM

## 2023-12-16 ENCOUNTER — Telehealth: Payer: Self-pay | Admitting: Orthopaedic Surgery

## 2023-12-16 NOTE — Telephone Encounter (Signed)
 Called pt and she wants to keep the appt with Iline Mallory at the [facility] location.

## 2023-12-16 NOTE — Telephone Encounter (Signed)
 Patient called in requesting to be seen sooner than her appointment on Thursday. Would like a call back about this [phone]

## 2023-12-19 ENCOUNTER — Encounter: Payer: Self-pay | Admitting: Orthopaedic Surgery

## 2023-12-19 ENCOUNTER — Ambulatory Visit: Admitting: Orthopaedic Surgery

## 2023-12-19 DIAGNOSIS — G8929 Other chronic pain: Secondary | ICD-10-CM

## 2023-12-19 DIAGNOSIS — M7062 Trochanteric bursitis, left hip: Secondary | ICD-10-CM | POA: Diagnosis not present

## 2023-12-19 NOTE — Progress Notes (Signed)
 My left hip is swelling.  She developed some swelling of the lateral hip on the left several days ago and had some slight redness.  The redness is resolving.  She denies any trauma, denies any bites.  The left hip has tenderness of the lateral side of the hip over the trochanteric area with slight swelling, no redness. ROM of the hip is full.  NV intact.  She has no increased warmth to the area.  Encounter Diagnoses  Name Primary?   Chronic left hip pain Yes   Trochanteric bursitis, left hip    She has pain medicine.  I told her to observe it for now.  Use heat/ice to the area tid to qid.  Return in two weeks.  Call if any problem.  Precautions discussed.  Electronically Signed Pleasant Brilliant, MD 6/19/20259:51 AM

## 2024-01-02 ENCOUNTER — Ambulatory Visit: Admitting: Orthopaedic Surgery

## 2024-01-02 ENCOUNTER — Encounter: Payer: Self-pay | Admitting: Orthopaedic Surgery

## 2024-01-02 VITALS — BP 129/81 | HR 70

## 2024-01-02 DIAGNOSIS — G8929 Other chronic pain: Secondary | ICD-10-CM | POA: Diagnosis not present

## 2024-01-02 DIAGNOSIS — M5441 Lumbago with sciatica, right side: Secondary | ICD-10-CM

## 2024-01-02 DIAGNOSIS — M25561 Pain in right knee: Secondary | ICD-10-CM | POA: Diagnosis not present

## 2024-01-02 NOTE — Addendum Note (Signed)
 Addended by: LYNNANN HARVEY E on: 01/02/2024 10:52 AM   Modules accepted: Orders

## 2024-01-02 NOTE — Progress Notes (Signed)
 My knee is hurting.  She is having pain and swelling of the right knee.  She has no trauma, no redness, no numbness.  It bothers her more at night.  Right knee has effusion, crepitus, ROM 0 to 105, stable, NV intact, no distal edema.  Slight limp to the right.  Encounter Diagnosis  Name Primary?   Chronic pain of right knee Yes   PROCEDURE NOTE:  The patient requests injections of the right knee , verbal consent was obtained.  The right knee was prepped appropriately after time out was performed.   Sterile technique was observed and injection of 1 cc of DepoMedrol 40mg  with several cc's of plain xylocaine . Anesthesia was provided by ethyl chloride and a 20-gauge needle was used to inject the knee area. The injection was tolerated well.  A band aid dressing was applied.  The patient was advised to apply ice later today and tomorrow to the injection sight as needed.  She has pain medicine, use as needed.  I will set her up to see Dr. Hermina for PT at her request.  Return in two weeks.  Call if any problem.  Precautions discussed.  Electronically Signed Lemond Stable, MD 7/3/202510:47 AM

## 2024-01-07 DIAGNOSIS — L110 Acquired keratosis follicularis: Secondary | ICD-10-CM | POA: Diagnosis not present

## 2024-01-07 DIAGNOSIS — M79671 Pain in right foot: Secondary | ICD-10-CM | POA: Diagnosis not present

## 2024-01-07 DIAGNOSIS — M779 Enthesopathy, unspecified: Secondary | ICD-10-CM | POA: Diagnosis not present

## 2024-01-07 DIAGNOSIS — M79674 Pain in right toe(s): Secondary | ICD-10-CM | POA: Diagnosis not present

## 2024-01-16 ENCOUNTER — Ambulatory Visit: Admitting: Orthopaedic Surgery

## 2024-01-16 DIAGNOSIS — H0102B Squamous blepharitis left eye, upper and lower eyelids: Secondary | ICD-10-CM | POA: Diagnosis not present

## 2024-01-16 DIAGNOSIS — H16223 Keratoconjunctivitis sicca, not specified as Sjogren's, bilateral: Secondary | ICD-10-CM | POA: Diagnosis not present

## 2024-01-16 DIAGNOSIS — H1045 Other chronic allergic conjunctivitis: Secondary | ICD-10-CM | POA: Diagnosis not present

## 2024-01-16 DIAGNOSIS — H0102A Squamous blepharitis right eye, upper and lower eyelids: Secondary | ICD-10-CM | POA: Diagnosis not present

## 2024-01-16 DIAGNOSIS — Z961 Presence of intraocular lens: Secondary | ICD-10-CM | POA: Diagnosis not present

## 2024-01-16 DIAGNOSIS — D3132 Benign neoplasm of left choroid: Secondary | ICD-10-CM | POA: Diagnosis not present

## 2024-01-16 DIAGNOSIS — D3131 Benign neoplasm of right choroid: Secondary | ICD-10-CM | POA: Diagnosis not present

## 2024-01-22 NOTE — Progress Notes (Signed)
 The patient attended a screening event on 10/26/2023 where her BP screening results was 141/81, Blood Glucose 90 not fasting. At the event the patient noted she has medicare/mediciad insurance and does not smoke. Patient did not indicate any SDOH insecurities. Pt list pcp as Dr. Eligio Fairly, MD. Per chart review pt has a pcp and the last office visit was on 10/25/2023 for essential hypertension. The pt BP was 110/76 on 07/30/23. According to chart review pt problem list is hypertension. Patient is currently on lisinopril to manage blood pressure. Chart review indicates pt is being seen by an orthopedics for knee pain. Abnormal letter sent with blood pressure resources case needed by patient. No additional [employer] team support indicated at this time.

## 2024-02-04 DIAGNOSIS — M6281 Muscle weakness (generalized): Secondary | ICD-10-CM | POA: Diagnosis not present

## 2024-02-04 DIAGNOSIS — M5441 Lumbago with sciatica, right side: Secondary | ICD-10-CM | POA: Diagnosis not present

## 2024-02-04 DIAGNOSIS — M545 Low back pain, unspecified: Secondary | ICD-10-CM | POA: Diagnosis not present

## 2024-02-04 DIAGNOSIS — M25552 Pain in left hip: Secondary | ICD-10-CM | POA: Diagnosis not present

## 2024-02-04 DIAGNOSIS — M25561 Pain in right knee: Secondary | ICD-10-CM | POA: Diagnosis not present

## 2024-02-06 DIAGNOSIS — M5441 Lumbago with sciatica, right side: Secondary | ICD-10-CM | POA: Diagnosis not present

## 2024-02-06 DIAGNOSIS — M545 Low back pain, unspecified: Secondary | ICD-10-CM | POA: Diagnosis not present

## 2024-02-06 DIAGNOSIS — M6281 Muscle weakness (generalized): Secondary | ICD-10-CM | POA: Diagnosis not present

## 2024-02-06 DIAGNOSIS — M25552 Pain in left hip: Secondary | ICD-10-CM | POA: Diagnosis not present

## 2024-02-06 DIAGNOSIS — M25561 Pain in right knee: Secondary | ICD-10-CM | POA: Diagnosis not present

## 2024-02-07 DIAGNOSIS — Z79899 Other long term (current) drug therapy: Secondary | ICD-10-CM | POA: Diagnosis not present

## 2024-02-07 DIAGNOSIS — R5383 Other fatigue: Secondary | ICD-10-CM | POA: Diagnosis not present

## 2024-02-07 DIAGNOSIS — Z0000 Encounter for general adult medical examination without abnormal findings: Secondary | ICD-10-CM | POA: Diagnosis not present

## 2024-02-07 DIAGNOSIS — M1990 Unspecified osteoarthritis, unspecified site: Secondary | ICD-10-CM | POA: Diagnosis not present

## 2024-02-07 DIAGNOSIS — Z7189 Other specified counseling: Secondary | ICD-10-CM | POA: Diagnosis not present

## 2024-02-07 DIAGNOSIS — E7800 Pure hypercholesterolemia, unspecified: Secondary | ICD-10-CM | POA: Diagnosis not present

## 2024-02-07 DIAGNOSIS — E559 Vitamin D deficiency, unspecified: Secondary | ICD-10-CM | POA: Diagnosis not present

## 2024-02-07 DIAGNOSIS — I10 Essential (primary) hypertension: Secondary | ICD-10-CM | POA: Diagnosis not present

## 2024-02-07 DIAGNOSIS — Z299 Encounter for prophylactic measures, unspecified: Secondary | ICD-10-CM | POA: Diagnosis not present

## 2024-02-11 DIAGNOSIS — M25561 Pain in right knee: Secondary | ICD-10-CM | POA: Diagnosis not present

## 2024-02-11 DIAGNOSIS — M5441 Lumbago with sciatica, right side: Secondary | ICD-10-CM | POA: Diagnosis not present

## 2024-02-11 DIAGNOSIS — M6281 Muscle weakness (generalized): Secondary | ICD-10-CM | POA: Diagnosis not present

## 2024-02-11 DIAGNOSIS — M545 Low back pain, unspecified: Secondary | ICD-10-CM | POA: Diagnosis not present

## 2024-02-11 DIAGNOSIS — M25552 Pain in left hip: Secondary | ICD-10-CM | POA: Diagnosis not present

## 2024-02-13 DIAGNOSIS — M545 Low back pain, unspecified: Secondary | ICD-10-CM | POA: Diagnosis not present

## 2024-02-13 DIAGNOSIS — M5441 Lumbago with sciatica, right side: Secondary | ICD-10-CM | POA: Diagnosis not present

## 2024-02-13 DIAGNOSIS — M6281 Muscle weakness (generalized): Secondary | ICD-10-CM | POA: Diagnosis not present

## 2024-02-13 DIAGNOSIS — M25552 Pain in left hip: Secondary | ICD-10-CM | POA: Diagnosis not present

## 2024-02-13 DIAGNOSIS — M25561 Pain in right knee: Secondary | ICD-10-CM | POA: Diagnosis not present

## 2024-02-18 DIAGNOSIS — M25561 Pain in right knee: Secondary | ICD-10-CM | POA: Diagnosis not present

## 2024-02-18 DIAGNOSIS — M25552 Pain in left hip: Secondary | ICD-10-CM | POA: Diagnosis not present

## 2024-02-18 DIAGNOSIS — M545 Low back pain, unspecified: Secondary | ICD-10-CM | POA: Diagnosis not present

## 2024-02-18 DIAGNOSIS — M6281 Muscle weakness (generalized): Secondary | ICD-10-CM | POA: Diagnosis not present

## 2024-02-18 DIAGNOSIS — M5441 Lumbago with sciatica, right side: Secondary | ICD-10-CM | POA: Diagnosis not present

## 2024-02-20 DIAGNOSIS — M6281 Muscle weakness (generalized): Secondary | ICD-10-CM | POA: Diagnosis not present

## 2024-02-20 DIAGNOSIS — M25552 Pain in left hip: Secondary | ICD-10-CM | POA: Diagnosis not present

## 2024-02-20 DIAGNOSIS — M5441 Lumbago with sciatica, right side: Secondary | ICD-10-CM | POA: Diagnosis not present

## 2024-02-20 DIAGNOSIS — M25561 Pain in right knee: Secondary | ICD-10-CM | POA: Diagnosis not present

## 2024-02-20 DIAGNOSIS — M545 Low back pain, unspecified: Secondary | ICD-10-CM | POA: Diagnosis not present

## 2024-02-25 DIAGNOSIS — M545 Low back pain, unspecified: Secondary | ICD-10-CM | POA: Diagnosis not present

## 2024-02-25 DIAGNOSIS — M25561 Pain in right knee: Secondary | ICD-10-CM | POA: Diagnosis not present

## 2024-02-25 DIAGNOSIS — M5441 Lumbago with sciatica, right side: Secondary | ICD-10-CM | POA: Diagnosis not present

## 2024-02-25 DIAGNOSIS — M25552 Pain in left hip: Secondary | ICD-10-CM | POA: Diagnosis not present

## 2024-02-25 DIAGNOSIS — M6281 Muscle weakness (generalized): Secondary | ICD-10-CM | POA: Diagnosis not present

## 2024-02-27 DIAGNOSIS — M6281 Muscle weakness (generalized): Secondary | ICD-10-CM | POA: Diagnosis not present

## 2024-02-27 DIAGNOSIS — M545 Low back pain, unspecified: Secondary | ICD-10-CM | POA: Diagnosis not present

## 2024-02-27 DIAGNOSIS — M25561 Pain in right knee: Secondary | ICD-10-CM | POA: Diagnosis not present

## 2024-02-27 DIAGNOSIS — M5441 Lumbago with sciatica, right side: Secondary | ICD-10-CM | POA: Diagnosis not present

## 2024-02-27 DIAGNOSIS — M25552 Pain in left hip: Secondary | ICD-10-CM | POA: Diagnosis not present

## 2024-03-10 DIAGNOSIS — M25552 Pain in left hip: Secondary | ICD-10-CM | POA: Diagnosis not present

## 2024-03-10 DIAGNOSIS — M6281 Muscle weakness (generalized): Secondary | ICD-10-CM | POA: Diagnosis not present

## 2024-03-10 DIAGNOSIS — M25561 Pain in right knee: Secondary | ICD-10-CM | POA: Diagnosis not present

## 2024-03-10 DIAGNOSIS — M5441 Lumbago with sciatica, right side: Secondary | ICD-10-CM | POA: Diagnosis not present

## 2024-03-10 DIAGNOSIS — M545 Low back pain, unspecified: Secondary | ICD-10-CM | POA: Diagnosis not present

## 2024-03-12 DIAGNOSIS — M5441 Lumbago with sciatica, right side: Secondary | ICD-10-CM | POA: Diagnosis not present

## 2024-03-12 DIAGNOSIS — M25561 Pain in right knee: Secondary | ICD-10-CM | POA: Diagnosis not present

## 2024-03-12 DIAGNOSIS — M25552 Pain in left hip: Secondary | ICD-10-CM | POA: Diagnosis not present

## 2024-03-12 DIAGNOSIS — M545 Low back pain, unspecified: Secondary | ICD-10-CM | POA: Diagnosis not present

## 2024-03-12 DIAGNOSIS — M6281 Muscle weakness (generalized): Secondary | ICD-10-CM | POA: Diagnosis not present

## 2024-03-16 DIAGNOSIS — R109 Unspecified abdominal pain: Secondary | ICD-10-CM | POA: Diagnosis not present

## 2024-03-16 DIAGNOSIS — M545 Low back pain, unspecified: Secondary | ICD-10-CM | POA: Diagnosis not present

## 2024-03-16 DIAGNOSIS — I10 Essential (primary) hypertension: Secondary | ICD-10-CM | POA: Diagnosis not present

## 2024-03-16 DIAGNOSIS — E7800 Pure hypercholesterolemia, unspecified: Secondary | ICD-10-CM | POA: Diagnosis not present

## 2024-03-16 DIAGNOSIS — M25561 Pain in right knee: Secondary | ICD-10-CM | POA: Diagnosis not present

## 2024-03-16 DIAGNOSIS — Z299 Encounter for prophylactic measures, unspecified: Secondary | ICD-10-CM | POA: Diagnosis not present

## 2024-03-16 DIAGNOSIS — M5441 Lumbago with sciatica, right side: Secondary | ICD-10-CM | POA: Diagnosis not present

## 2024-03-16 DIAGNOSIS — M25552 Pain in left hip: Secondary | ICD-10-CM | POA: Diagnosis not present

## 2024-03-16 DIAGNOSIS — K529 Noninfective gastroenteritis and colitis, unspecified: Secondary | ICD-10-CM | POA: Diagnosis not present

## 2024-03-16 DIAGNOSIS — M6281 Muscle weakness (generalized): Secondary | ICD-10-CM | POA: Diagnosis not present

## 2024-03-18 DIAGNOSIS — M25561 Pain in right knee: Secondary | ICD-10-CM | POA: Diagnosis not present

## 2024-03-18 DIAGNOSIS — M25552 Pain in left hip: Secondary | ICD-10-CM | POA: Diagnosis not present

## 2024-03-18 DIAGNOSIS — M5441 Lumbago with sciatica, right side: Secondary | ICD-10-CM | POA: Diagnosis not present

## 2024-03-18 DIAGNOSIS — M6281 Muscle weakness (generalized): Secondary | ICD-10-CM | POA: Diagnosis not present

## 2024-03-18 DIAGNOSIS — M545 Low back pain, unspecified: Secondary | ICD-10-CM | POA: Diagnosis not present

## 2024-03-19 DIAGNOSIS — M79674 Pain in right toe(s): Secondary | ICD-10-CM | POA: Diagnosis not present

## 2024-03-19 DIAGNOSIS — M79671 Pain in right foot: Secondary | ICD-10-CM | POA: Diagnosis not present

## 2024-03-19 DIAGNOSIS — M79675 Pain in left toe(s): Secondary | ICD-10-CM | POA: Diagnosis not present

## 2024-03-19 DIAGNOSIS — M79672 Pain in left foot: Secondary | ICD-10-CM | POA: Diagnosis not present

## 2024-03-19 DIAGNOSIS — L110 Acquired keratosis follicularis: Secondary | ICD-10-CM | POA: Diagnosis not present

## 2024-03-19 DIAGNOSIS — I739 Peripheral vascular disease, unspecified: Secondary | ICD-10-CM | POA: Diagnosis not present

## 2024-03-24 DIAGNOSIS — M5441 Lumbago with sciatica, right side: Secondary | ICD-10-CM | POA: Diagnosis not present

## 2024-03-24 DIAGNOSIS — M25552 Pain in left hip: Secondary | ICD-10-CM | POA: Diagnosis not present

## 2024-03-24 DIAGNOSIS — M25561 Pain in right knee: Secondary | ICD-10-CM | POA: Diagnosis not present

## 2024-03-24 DIAGNOSIS — M545 Low back pain, unspecified: Secondary | ICD-10-CM | POA: Diagnosis not present

## 2024-03-24 DIAGNOSIS — M6281 Muscle weakness (generalized): Secondary | ICD-10-CM | POA: Diagnosis not present

## 2024-03-27 DIAGNOSIS — I10 Essential (primary) hypertension: Secondary | ICD-10-CM | POA: Diagnosis not present

## 2024-03-27 DIAGNOSIS — Z299 Encounter for prophylactic measures, unspecified: Secondary | ICD-10-CM | POA: Diagnosis not present

## 2024-03-27 DIAGNOSIS — R42 Dizziness and giddiness: Secondary | ICD-10-CM | POA: Diagnosis not present
